# Patient Record
Sex: Female | Born: 1935 | Race: White | Hispanic: No | Marital: Married | Smoking: Never smoker
Health system: Southern US, Community
[De-identification: ages and names within clinical notes are randomized; demographics above are authoritative.]

## PROBLEM LIST (undated history)

## (undated) DIAGNOSIS — E119 Type 2 diabetes mellitus without complications: Secondary | ICD-10-CM

## (undated) DIAGNOSIS — I639 Cerebral infarction, unspecified: Secondary | ICD-10-CM

## (undated) DIAGNOSIS — G4730 Sleep apnea, unspecified: Secondary | ICD-10-CM

## (undated) DIAGNOSIS — I739 Peripheral vascular disease, unspecified: Secondary | ICD-10-CM

## (undated) DIAGNOSIS — K219 Gastro-esophageal reflux disease without esophagitis: Secondary | ICD-10-CM

## (undated) DIAGNOSIS — M1990 Unspecified osteoarthritis, unspecified site: Secondary | ICD-10-CM

## (undated) DIAGNOSIS — F419 Anxiety disorder, unspecified: Secondary | ICD-10-CM

## (undated) DIAGNOSIS — M5412 Radiculopathy, cervical region: Secondary | ICD-10-CM

## (undated) DIAGNOSIS — R519 Headache, unspecified: Secondary | ICD-10-CM

## (undated) DIAGNOSIS — G4733 Obstructive sleep apnea (adult) (pediatric): Secondary | ICD-10-CM

## (undated) DIAGNOSIS — H269 Unspecified cataract: Secondary | ICD-10-CM

## (undated) DIAGNOSIS — J45909 Unspecified asthma, uncomplicated: Secondary | ICD-10-CM

## (undated) DIAGNOSIS — E785 Hyperlipidemia, unspecified: Secondary | ICD-10-CM

## (undated) DIAGNOSIS — A159 Respiratory tuberculosis unspecified: Secondary | ICD-10-CM

## (undated) DIAGNOSIS — T7840XA Allergy, unspecified, initial encounter: Secondary | ICD-10-CM

## (undated) DIAGNOSIS — R7611 Nonspecific reaction to tuberculin skin test without active tuberculosis: Secondary | ICD-10-CM

## (undated) DIAGNOSIS — E079 Disorder of thyroid, unspecified: Secondary | ICD-10-CM

## (undated) DIAGNOSIS — G4700 Insomnia, unspecified: Secondary | ICD-10-CM

## (undated) DIAGNOSIS — R51 Headache: Secondary | ICD-10-CM

## (undated) DIAGNOSIS — Z8673 Personal history of transient ischemic attack (TIA), and cerebral infarction without residual deficits: Secondary | ICD-10-CM

## (undated) DIAGNOSIS — N189 Chronic kidney disease, unspecified: Secondary | ICD-10-CM

## (undated) DIAGNOSIS — G8929 Other chronic pain: Secondary | ICD-10-CM

## (undated) DIAGNOSIS — I10 Essential (primary) hypertension: Secondary | ICD-10-CM

## (undated) DIAGNOSIS — M797 Fibromyalgia: Secondary | ICD-10-CM

## (undated) DIAGNOSIS — K589 Irritable bowel syndrome without diarrhea: Secondary | ICD-10-CM

## (undated) HISTORY — DX: Fibromyalgia: M79.7

## (undated) HISTORY — PX: TOTAL ABDOMINAL HYSTERECTOMY: SHX209

## (undated) HISTORY — DX: Unspecified osteoarthritis, unspecified site: M19.90

## (undated) HISTORY — DX: Radiculopathy, cervical region: M54.12

## (undated) HISTORY — DX: Respiratory tuberculosis unspecified: A15.9

## (undated) HISTORY — PX: OTHER SURGICAL HISTORY: SHX169

## (undated) HISTORY — DX: Peripheral vascular disease, unspecified: I73.9

## (undated) HISTORY — DX: Disorder of thyroid, unspecified: E07.9

## (undated) HISTORY — DX: Cerebral infarction, unspecified: I63.9

## (undated) HISTORY — PX: OVARIAN CYST REMOVAL: SHX89

## (undated) HISTORY — DX: Irritable bowel syndrome, unspecified: K58.9

## (undated) HISTORY — DX: Insomnia, unspecified: G47.00

## (undated) HISTORY — DX: Headache, unspecified: R51.9

## (undated) HISTORY — DX: Essential (primary) hypertension: I10

## (undated) HISTORY — DX: Personal history of transient ischemic attack (TIA), and cerebral infarction without residual deficits: Z86.73

## (undated) HISTORY — DX: Anxiety disorder, unspecified: F41.9

## (undated) HISTORY — DX: Unspecified cataract: H26.9

## (undated) HISTORY — DX: Chronic kidney disease, unspecified: N18.9

## (undated) HISTORY — DX: Hyperlipidemia, unspecified: E78.5

## (undated) HISTORY — DX: Unspecified asthma, uncomplicated: J45.909

## (undated) HISTORY — DX: Type 2 diabetes mellitus without complications: E11.9

## (undated) HISTORY — DX: Headache: R51

## (undated) HISTORY — DX: Allergy, unspecified, initial encounter: T78.40XA

## (undated) HISTORY — DX: Gastro-esophageal reflux disease without esophagitis: K21.9

## (undated) HISTORY — DX: Other chronic pain: G89.29

## (undated) HISTORY — DX: Sleep apnea, unspecified: G47.30

## (undated) HISTORY — DX: Obstructive sleep apnea (adult) (pediatric): G47.33

## (undated) HISTORY — DX: Nonspecific reaction to tuberculin skin test without active tuberculosis: R76.11

---

## 2007-09-26 HISTORY — PX: LUMBAR LAMINECTOMY: SHX95

## 2007-09-26 HISTORY — PX: HIP SURGERY: SHX245

## 2010-07-26 HISTORY — PX: HYDROCELE EXCISION / REPAIR: SUR1145

## 2011-03-28 ENCOUNTER — Emergency Department (INDEPENDENT_AMBULATORY_CARE_PROVIDER_SITE_OTHER): Payer: Medicare Other

## 2011-03-28 ENCOUNTER — Emergency Department (HOSPITAL_BASED_OUTPATIENT_CLINIC_OR_DEPARTMENT_OTHER)
Admission: EM | Admit: 2011-03-28 | Discharge: 2011-03-28 | Disposition: A | Discharge status: Home / Self Care | Payer: Medicare Other | Attending: Emergency Medicine | Admitting: Emergency Medicine

## 2011-03-28 DIAGNOSIS — N390 Urinary tract infection, site not specified: Secondary | ICD-10-CM | POA: Insufficient documentation

## 2011-03-28 DIAGNOSIS — R51 Headache: Secondary | ICD-10-CM

## 2011-03-28 DIAGNOSIS — I10 Essential (primary) hypertension: Secondary | ICD-10-CM | POA: Insufficient documentation

## 2011-03-28 DIAGNOSIS — I679 Cerebrovascular disease, unspecified: Secondary | ICD-10-CM

## 2011-03-28 DIAGNOSIS — M542 Cervicalgia: Secondary | ICD-10-CM | POA: Insufficient documentation

## 2011-03-28 DIAGNOSIS — K5900 Constipation, unspecified: Secondary | ICD-10-CM | POA: Insufficient documentation

## 2011-03-28 DIAGNOSIS — E7800 Pure hypercholesterolemia, unspecified: Secondary | ICD-10-CM | POA: Insufficient documentation

## 2011-03-28 DIAGNOSIS — R109 Unspecified abdominal pain: Secondary | ICD-10-CM

## 2011-03-28 DIAGNOSIS — Z8739 Personal history of other diseases of the musculoskeletal system and connective tissue: Secondary | ICD-10-CM | POA: Insufficient documentation

## 2011-03-28 DIAGNOSIS — R42 Dizziness and giddiness: Secondary | ICD-10-CM

## 2011-03-28 DIAGNOSIS — E119 Type 2 diabetes mellitus without complications: Secondary | ICD-10-CM | POA: Insufficient documentation

## 2011-03-28 DIAGNOSIS — G319 Degenerative disease of nervous system, unspecified: Secondary | ICD-10-CM

## 2011-03-28 LAB — URINALYSIS, ROUTINE W REFLEX MICROSCOPIC
Bilirubin Urine: NEGATIVE
Ketones, ur: NEGATIVE mg/dL
Specific Gravity, Urine: 1.021 (ref 1.005–1.030)
pH: 6 (ref 5.0–8.0)

## 2011-03-28 LAB — COMPREHENSIVE METABOLIC PANEL
ALT: 25 U/L (ref 0–35)
Alkaline Phosphatase: 72 U/L (ref 39–117)
CO2: 26 mEq/L (ref 19–32)
Chloride: 104 mEq/L (ref 96–112)
GFR calc Af Amer: >60 mL/min (ref >60)
Glucose, Bld: 157 mg/dL — ABNORMAL HIGH (ref 70–99)
Potassium: 3.8 mEq/L (ref 3.5–5.1)
Sodium: 140 mEq/L (ref 135–145)
Total Protein: 7.2 g/dL (ref 6.0–8.3)

## 2011-03-28 LAB — URINE MICROSCOPIC-ADD ON

## 2011-03-28 LAB — CBC
Hemoglobin: 12.9 g/dL (ref 12.0–15.0)
MCHC: 33.5 g/dL (ref 30.0–36.0)
RBC: 4.44 MIL/uL (ref 3.87–5.11)
WBC: 6.3 10*3/uL (ref 4.0–10.5)

## 2011-03-28 LAB — DIFFERENTIAL
Basophils Absolute: 0 10*3/uL (ref 0.0–0.1)
Basophils Relative: 1 % (ref 0–1)
Neutro Abs: 3.6 10*3/uL (ref 1.7–7.7)
Neutrophils Relative %: 57 % (ref 43–77)

## 2012-09-25 HISTORY — PX: CATARACT EXTRACTION, BILATERAL: SHX1313

## 2014-08-18 DIAGNOSIS — Z982 Presence of cerebrospinal fluid drainage device: Secondary | ICD-10-CM

## 2014-08-18 DIAGNOSIS — G910 Communicating hydrocephalus: Secondary | ICD-10-CM | POA: Diagnosis present

## 2015-08-18 DIAGNOSIS — G4733 Obstructive sleep apnea (adult) (pediatric): Secondary | ICD-10-CM | POA: Diagnosis present

## 2015-08-18 DIAGNOSIS — I10 Essential (primary) hypertension: Secondary | ICD-10-CM | POA: Insufficient documentation

## 2015-08-18 DIAGNOSIS — M797 Fibromyalgia: Secondary | ICD-10-CM | POA: Insufficient documentation

## 2015-12-08 DIAGNOSIS — E1169 Type 2 diabetes mellitus with other specified complication: Secondary | ICD-10-CM | POA: Diagnosis present

## 2015-12-08 DIAGNOSIS — E669 Obesity, unspecified: Secondary | ICD-10-CM

## 2015-12-08 DIAGNOSIS — K219 Gastro-esophageal reflux disease without esophagitis: Secondary | ICD-10-CM | POA: Insufficient documentation

## 2015-12-08 DIAGNOSIS — K581 Irritable bowel syndrome with constipation: Secondary | ICD-10-CM | POA: Insufficient documentation

## 2016-02-14 DIAGNOSIS — Z8711 Personal history of peptic ulcer disease: Secondary | ICD-10-CM | POA: Insufficient documentation

## 2016-02-21 DIAGNOSIS — I5189 Other ill-defined heart diseases: Secondary | ICD-10-CM | POA: Insufficient documentation

## 2016-02-29 DIAGNOSIS — M5416 Radiculopathy, lumbar region: Secondary | ICD-10-CM | POA: Insufficient documentation

## 2016-04-21 DIAGNOSIS — R413 Other amnesia: Secondary | ICD-10-CM | POA: Insufficient documentation

## 2016-05-26 ENCOUNTER — Ambulatory Visit: Payer: Medicare Other | Admitting: Internal Medicine

## 2016-07-17 ENCOUNTER — Encounter: Payer: Self-pay | Admitting: Internal Medicine

## 2016-07-24 ENCOUNTER — Encounter: Payer: Self-pay | Admitting: Neurology

## 2016-07-24 ENCOUNTER — Ambulatory Visit (INDEPENDENT_AMBULATORY_CARE_PROVIDER_SITE_OTHER): Payer: Medicare (Managed Care) | Admitting: Neurology

## 2016-07-24 VITALS — BP 142/78 | HR 76 | Resp 16 | Ht 62.8 in | Wt 179.0 lb

## 2016-07-24 DIAGNOSIS — R419 Unspecified symptoms and signs involving cognitive functions and awareness: Secondary | ICD-10-CM

## 2016-07-24 DIAGNOSIS — Z982 Presence of cerebrospinal fluid drainage device: Secondary | ICD-10-CM | POA: Diagnosis not present

## 2016-07-24 DIAGNOSIS — R2689 Other abnormalities of gait and mobility: Secondary | ICD-10-CM

## 2016-07-24 DIAGNOSIS — G912 (Idiopathic) normal pressure hydrocephalus: Secondary | ICD-10-CM | POA: Diagnosis not present

## 2016-07-24 NOTE — Patient Instructions (Addendum)
I believe you have a gait disorder, which likely is due to a combination of things: normal aging, degenerative arthritis of your back, possible atherosclerosis of the blood vessels in your brain.   Remember to drink plenty of fluid, eat healthy meals and do not skip any meals. Try to eat protein with a every meal and eat a healthy snack such as fruit or nuts in between meals. Try to keep a regular sleep-wake schedule and try to exercise daily, particularly in the form of walking, 20-30 minutes a day, if you can. Change positions slowly and you should use your walker at all times.  As far as your medications are concerned, I would like to suggest no new medications.    Please reduce your sweetened tea intake and increase your water intake.   Your memory scores are fairly good. We will monitor your exam. Your neuro exam is non focal. Nevertheless, please see your new neurosurgeon - your primary care may be working on a referral for this.   You may want to see your orthopedic doctor for low back pain. An epidural steroid injection sometimes does help for months at a time.   Please be really proactive with your constipation medication regimen, titrating as needed to where you have a formed stool at least every other day.

## 2016-07-24 NOTE — Progress Notes (Signed)
Subjective:    Patient ID: Brittany Atkins is a 80 y.o. female.  HPI     Star Age, MD, PhD [facility] [address] [place], [place] [address]  Dear Dr. Jimmye Norman,   I saw your patient, Brittany Atkins, upon your kind request in my neurologic clinic today for initial consultation of her gait disorder and memory loss. The patient is accompanied by her daughter today. 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 She was diagnosed with normal pressure hydrocephalus several years prior to her shunt placement, then moved to [place] and moved back to [place] about 3 years ago. [facility], goes twice weekly, Mondays and Wednesdays for about 4 hours each time and has physical therapy and occupational therapy there.  She had a head CT without contrast on 03/28/2011 which I reviewed: IMPRESSION: No acute intracranial abnormality. Atrophy, chronic microvascular disease.  She was diagnosed with  TIA in May 2017. She was at [facility]. Records are not available for my review today. Symptoms  Included bilateral leg weakness and slurring of speech as I understand. She did not have one-sided weakness or numbness or facial droop. She was seen by  Her  Neurosurgeon at the time and shunt was adjusted per daughter. She has not seen her neurosurgeon since then, but there may be an insurance change as well, requiring her to change to another NSG practice.  She has had low back pain. She had seen orthopedics for right hip pain before and received an injection into the right hip. She was told that she may need a referral for low back pain to another specialist and that she may benefit from an ESI.  She has been using a  rolling walker.  Thankfully, she has not fallen  Recently but has come close to several times.   Memory wise, she has trouble focusing and has short-term memory problems as well as trouble with complex tasks such as cooking. She does not always eat well. She is not sure if she drinks enough. She feels like she has something to drink all the time. During her hospitalization in May she was told she had a urinary tract infection and had severe constipation with impaction. She lives with her husband, who has Parkinson's disease, and a single story home. One daughter and one son live in [place], her youngest daughter lives locally in [place]. Of note, she was diagnosed with obstructive sleep apnea and placed on CPAP therapy, which she could not tolerate as the headgear bothered her shunt tubing and caused pain. Per daughter, sleep apnea was mild.  Her Past Medical History Is Significant For: Past Medical History:  Diagnosis Date  . Asthma with allergic rhinitis   . Cervical radiculopathy   . Chronic pain   . Diabetes mellitus without complication ([place])   . Fibromyalgia   . GERD (gastroesophageal reflux disease)   . H/O TIA (transient ischemic attack) and stroke   . Headache   . Hypertension   . OSA (obstructive sleep apnea)   . Osteoarthritis     Her Past Surgical History Is Significant For: Past Surgical History:  Procedure Laterality Date  . BACK SURGERY    . CATARACT EXTRACTION, BILATERAL    . HIP SURGERY    .  HYSTEROTOMY      Her Family History Is Significant For: Family History  Problem Relation Age of Onset  . Hypertension Mother   . Hyperlipidemia Mother   . Heart disease Mother   . Breast cancer Mother   . Diabetes Father   . Hypertension Father   . Cancer Father   . Stroke Father     Her Social History Is Significant For: Social History   Social History  . Marital status: Married    Spouse name: N/A  . Number of children: 3  . Years of education: college    Occupational History  . retired    Social History Main Topics  . Smoking status: Never Smoker  . Smokeless tobacco: Never Used  . Alcohol use No  . Drug use: No  . Sexual activity: Not Asked   Other Topics Concern  . None   Social History Narrative   Drinks 1 cup of coffee a day, also drinks green tea     Her Allergies Are:  Allergies  Allergen Reactions  . Cefdinir   . Clindamycin/Lincomycin   . Codeine Nausea Only  . Sulfamethizole   . Percocet [Oxycodone-Acetaminophen] Rash  :   Her Current Medications Are:  Outpatient Encounter Prescriptions as of 07/24/2016  Medication Sig  . albuterol (PROAIR HFA) 108 (90 Base) MCG/ACT inhaler Inhale into the lungs every 6 (six) hours as needed for wheezing or shortness of breath.  Marland Kitchen aspirin 81 MG tablet Take 81 mg by mouth daily.  Marland Kitchen atorvastatin (LIPITOR) 40 MG tablet Take 40 mg by mouth daily.  Marland Kitchen azelastine (OPTIVAR) 0.05 % ophthalmic solution 1 drop 2 (two) times daily.  Marland Kitchen b complex vitamins tablet Take 1 tablet by mouth daily.  . Coenzyme Q10 100 MG TABS Take by mouth.  . famotidine (PEPCID) 40 MG tablet Take 40 mg by mouth daily.  . fluticasone (FLONASE) 50 MCG/ACT nasal spray Place into both nostrils daily.  Marland Kitchen gabapentin (NEURONTIN) 100 MG capsule Take 100 mg by mouth 2 (two) times daily.  Marland Kitchen gabapentin (NEURONTIN) 400 MG capsule Take 400 mg by mouth 3 (three) times daily.  . Lactobacillus (ACIDOPHILUS) 100 MG CAPS Take by mouth.  . loratadine (CLARITIN) 10 MG tablet Take 10 mg by mouth daily.  Marland Kitchen losartan-hydrochlorothiazide (HYZAAR) 50-12.5 MG tablet Take 1 tablet by mouth daily.  . Melatonin 3 MG TABS Take by mouth.  . metFORMIN (GLUCOPHAGE) 500 MG tablet Take by mouth 2 (two) times daily with a meal.  . milk thistle 175 MG tablet Take 175 mg by mouth daily.  . Multiple Vitamin (MULTIVITAMIN) capsule Take 1 capsule by mouth daily.  . traMADol (ULTRAM) 50 MG tablet Take by mouth every 6 (six) hours as needed.   No  facility-administered encounter medications on file as of 07/24/2016.   :   Review of Systems:  Out of a complete 14 point review of systems, all are reviewed and negative with the exception of these symptoms as listed below:  Review of Systems  Neurological:       Patient has a shunt placed for hydrocephalus back in 2011 (Strata Adjustable Valve). Recently her gait has been off balanced and has increase in migraines. Referring doctor would like the shunt to be checked.     Objective:  Neurologic Exam  Physical Exam Physical Examination:   Vitals:   07/24/16 0959  BP: (!) 142/78  Pulse: 76  Resp: 16   General Examination: The patient is a very pleasant 80  y.o. female in no acute distress. She appears well-developed and well-nourished and well groomed.   HEENT: Normocephalic, atraumatic, pupils are equal, round and reactive to light and accommodation. Funduscopic exam is normal with sharp disc margins noted. Extraocular tracking is good without limitation to gaze excursion or nystagmus noted. Normal smooth pursuit is noted. Hearing is grossly intact. Face is symmetric with normal facial animation and normal facial sensation. Speech is clear with no dysarthria noted. There is no hypophonia. There is no lip, neck/head, jaw or voice tremor. Neck is supple with full range of passive and active motion. There are no carotid bruits on auscultation. Oropharynx exam reveals: mild mouth dryness, adequate dental hygiene and mild airway crowding, due to redundant soft palate. Mallampati is class II. Tongue protrudes centrally and palate elevates symmetrically. Unremarkable tubing from VPS on R.  Chest: Clear to auscultation without wheezing, rhonchi or crackles noted.  Heart: S1+S2+0, regular and normal without murmurs, rubs or gallops noted.   Abdomen: Soft, non-tender and non-distended with normal bowel sounds appreciated on auscultation.  Extremities: There is no pitting edema in the distal  lower extremities bilaterally. Pedal pulses are intact.  Skin: Warm and dry without trophic changes noted. There are no varicose veins.  Musculoskeletal: exam reveals no obvious joint deformities, tenderness or joint swelling or erythema.   Neurologically:  Mental status: The patient is awake, alert and oriented in all 4 spheres. Her immediate and remote memory, attention, language skills and fund of knowledge are fairly appropriate. There is no evidence of aphasia, agnosia, apraxia or anomia. Speech is clear with normal prosody and enunciation. Thought process is linear. Mood is normal and affect is normal.  On 07/24/2016: MMSE: 28/30, CDT: 4/4, AFT: 19/min.  Cranial nerves II - XII are as described above under HEENT exam. In addition: shoulder shrug is normal with equal shoulder height noted. Motor exam: Normal bulk, strength and tone is noted. There is no drift, tremor or rebound. Romberg is negative. Reflexes are 2+ throughout. Fine motor skills and coordination: intact with normal finger taps, normal hand movements, normal rapid alternating patting, normal foot taps and normal foot agility.  Cerebellar testing: No dysmetria or intention tremor on finger to nose testing. Heel to shin is unremarkable bilaterally. There is no truncal or gait ataxia.  Sensory exam: intact to light touch, pinprick, vibration, temperature sense in the upper and lower extremities.  Gait, station and balance: She stands with difficulty. No veering to one side is noted. No leaning to one side is noted. Posture is age-appropriate and stance is narrow based. Gait shows cautious and slow gait, she maneuvers her rolling walker well. No shuffling. Tandem walk is not possible for her.                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 On examination, she has a nonspecific gait disorder, most likely secondary to a combination of degenerative arthritis of the lower back, prior diagnosis of NPH, white matter atherosclerotic changes of the brain, normal aging, not always hydrating well enough. She does not have a shuffling gait or sensory ataxia, otherwise neurological exam is nonfocal, memory scores are fairly good and we will monitor. I did not suggest that we start her on  any new medication today. She will benefit from shunt reevaluation with her neurosurgeon or a referral to a different neurosurgical practice, this may be in the works from your office as she indicated that this may be in process through your office. In addition, she may benefit from seeing an orthopedic specialist for her low back pain as she has previously seen one in [facility] and may be able to go back to her orthopedic specialist. She is encouraged to do so. She is advised to drink plenty of water and be proactive with her constipation, use her walker at all times, we will continue to monitor her memory scores and gait.  I had a long chat with the patient and her daughter about my findings and her symptoms. We talked about maintaining a healthy lifestyle in general. I encouraged the patient to eat healthy, exercise daily and keep well hydrated, to keep a scheduled bedtime and wake time routine, to not skip any meals and eat healthy snacks in between meals and to have protein with every meal.  I would like to see her back in about 6 months, sooner as needed.  I answered all their questions today and the patient and her daughter were in agreement.  Thank you very much for allowing me to participate in the care of this nice patient. If I can be of any further assistance to you please do not hesitate to call me at [phone].  Sincerely,   Star Age, MD, PhD

## 2016-07-26 ENCOUNTER — Ambulatory Visit: Payer: Medicare Other | Admitting: Internal Medicine

## 2016-07-26 ENCOUNTER — Telehealth: Payer: Self-pay | Admitting: Gastroenterology

## 2016-09-07 NOTE — Telephone Encounter (Signed)
Pt was seen 2017 at [facility] and appt was canceled until further records recieved

## 2016-09-14 NOTE — Telephone Encounter (Signed)
Dr. Havery Moros reviewed records and has accepted patient. Ok to schedule OV. Left message for patient to return my call.

## 2016-09-15 ENCOUNTER — Encounter: Payer: Self-pay | Admitting: Gastroenterology

## 2016-11-07 ENCOUNTER — Ambulatory Visit: Payer: Medicare Other | Admitting: Gastroenterology

## 2016-11-09 ENCOUNTER — Encounter (INDEPENDENT_AMBULATORY_CARE_PROVIDER_SITE_OTHER): Payer: Self-pay

## 2016-11-09 ENCOUNTER — Ambulatory Visit (INDEPENDENT_AMBULATORY_CARE_PROVIDER_SITE_OTHER): Payer: Medicare (Managed Care) | Admitting: Gastroenterology

## 2016-11-09 ENCOUNTER — Other Ambulatory Visit: Payer: Medicare (Managed Care)

## 2016-11-09 ENCOUNTER — Encounter: Payer: Self-pay | Admitting: Gastroenterology

## 2016-11-09 VITALS — BP 104/56 | HR 76 | Ht 62.8 in | Wt 177.0 lb

## 2016-11-09 DIAGNOSIS — K582 Mixed irritable bowel syndrome: Secondary | ICD-10-CM | POA: Diagnosis not present

## 2016-11-09 DIAGNOSIS — K589 Irritable bowel syndrome without diarrhea: Secondary | ICD-10-CM

## 2016-11-09 DIAGNOSIS — R110 Nausea: Secondary | ICD-10-CM

## 2016-11-09 DIAGNOSIS — R1013 Epigastric pain: Secondary | ICD-10-CM

## 2016-11-09 DIAGNOSIS — R932 Abnormal findings on diagnostic imaging of liver and biliary tract: Secondary | ICD-10-CM

## 2016-11-09 DIAGNOSIS — Z86010 Personal history of colonic polyps: Secondary | ICD-10-CM

## 2016-11-09 MED ORDER — ONDANSETRON 4 MG PO TBDP: 4.0000 mg | ORAL_TABLET | Freq: Four times a day (QID) | ORAL | 3 refills | Status: DC | PRN

## 2016-11-09 MED ORDER — NA SULFATE-K SULFATE-MG SULF 17.5-3.13-1.6 GM/177ML PO SOLN: 1.0000 | Freq: Once | ORAL | 0 refills | Status: AC

## 2016-11-09 MED ORDER — DICYCLOMINE HCL 10 MG PO CAPS: 10.0000 mg | ORAL_CAPSULE | Freq: Three times a day (TID) | ORAL | 3 refills | Status: DC | PRN

## 2016-11-09 NOTE — Patient Instructions (Addendum)
If you are age 79 or older, your body mass index should be between 23-30. Your Body mass index is 31.55 kg/m. If this is out of the aforementioned range listed, please consider follow up with your Primary Care Provider.  If you are age 15 or younger, your body mass index should be between 19-25. Your Body mass index is 31.55 kg/m. If this is out of the aformentioned range listed, please consider follow up with your Primary Care Provider.   Dr Havery Moros has prescribed the following medications for you:  Bentyl  Zofran  You have been given samples of [place].  You have been given a Low FodMap diet to follow.  You have been scheduled for a colonoscopy. Please follow written instructions given to you at your visit today.  Please pick up your prep supplies at the pharmacy within the next 1-3 days. If you use inhalers (even only as needed), please bring them with you on the day of your procedure. Your physician has requested that you go to www.startemmi.com and enter the access code given to you at your visit today. This web site gives a general overview about your procedure. However, you should still follow specific instructions given to you by our office regarding your preparation for the procedure.  You have been scheduled for an abdominal ultrasound at [facility] (1st floor of hospital) on Wednesday February 21st at 10:00am. Please arrive 15 minutes prior to your appointment for registration. Make certain not to have anything to eat or drink 8 hours prior to your appointment. Should you need to reschedule your appointment, please contact radiology at [phone]. This test typically takes about 30 minutes to perform.  Your physician has requested that you go to the basement for the following lab work before leaving today:  IGA, TTG  Thank you.

## 2016-11-09 NOTE — Progress Notes (Signed)
HPI :  81 y/o female with a PMH of hepatic steatosis, GERD, fibromyalgia, IBS, NPH with shunt, who is new to our practice. She has been seen by Dr. Dorrene German at [place] in the past year and is requesting to establish with [place]. She reportedly has a history of IBS, treated with immodium, IB gard, and peppermint oil in the past which did not help too much. She has had negative c diff and GI pathogen panel testing. She's previously had a history of fecal impaction when hospitalized for TIA. Given miralax at that time and then has had persistent diarrhea, which was in 2015. Not used immodium due to fear of recurrent fecal impaction.   She reported problems with loose stools from May 2017 through September 2017, but now she has alternating constipation and diarrhea. She reports 50% diarrhea, 50% constipated stools. She has had some scant rectal bleeding she attributes to hemorrhoids. She has chronic pains in her abdomen, in the RLQ mostly. Pain is associated with a bowel movement. After a bowel movement her pain is reliably relieved. She is taking Senna currently. She is not taking any routine fiber supplements. She endorses gas and bloating which bothers her. She denies any prior testing for celiac disease.   She thinks her last colonoscopy was in 2013 or so, she reports having polyps removed and that she needed another colonoscopy in 5 years. Records not available.   She reports having a workup for possible cirrhosis. Imaging as below. She denies FH of liver disease. She states she had jaundice as a child but not as an adult. She reportedly has had negative labs for liver diseases in [place] (records not available) and then declined a liver biopsy. She denies alcohol use.   She reports she has difficulty with swallowing, feels food get lodged in her lower chest that bothers her, as well as pills. Bothers her most weeks. No liquid dysphagia, but mostly with pills. She is not sure if the dilation in the  past helped her. She has chronic nausea but doesn't vomit. She doesn't take anything for nausea. She endorses early satiety. She denies ever having GES or use of Reglan.    Prior workup on charts reviewed today: [place] on 05/04/15 - steatosis and  changes concerning for cirrhosis. She had a negative serologic workup for this and declined a liver biopsy per chart.   EGD 06/16/15 - lower esophageal stricture dilated to 39 Fr, biopsies taken to rule out EoE CT scan abdomen 07/07/15 - small HH, diverticulosis, mild hepatic steatosis Last Echo on 01/2016 showed EF of 60-65%  Labs on 06/2016: Hgb 12.9, WBC 7.0, platelet 195 ALT 29, AST 21, T bil 0.2, AP 81   Past Medical History:  Diagnosis Date  . Asthma with allergic rhinitis   . Cervical radiculopathy   . Chronic pain   . Diabetes mellitus without complication ([place])   . Fibromyalgia   . GERD (gastroesophageal reflux disease)   . H/O TIA (transient ischemic attack) and stroke   . Headache   . HLD (hyperlipidemia)   . Hypertension   . IBS (irritable bowel syndrome)   . Insomnia   . OSA (obstructive sleep apnea)   . Osteoarthritis   . PAD (peripheral artery disease) ([place])   . Positive PPD   NPH with shunt   Past Surgical History:  Procedure Laterality Date  . CATARACT EXTRACTION, BILATERAL  2014  . HIP SURGERY  2009   hip muscle  . HYDROCELE  EXCISION / REPAIR  07/2010  . LUMBAR LAMINECTOMY  2009  . OVARIAN CYST REMOVAL     with  fallopian tube  . TOTAL ABDOMINAL HYSTERECTOMY     Family History  Problem Relation Age of Onset  . Hypertension Mother   . Hyperlipidemia Mother   . Heart disease Mother   . Breast cancer Mother   . Diabetes Father   . Hypertension Father   . Stroke Father   . Prostate cancer Father   . Diabetes Sister   . Diabetes Brother     x 2  . Heart attack Maternal Grandmother   . Heart attack Maternal Grandfather   . Thyroid cancer Paternal Grandmother   . Hyperthyroidism Maternal Aunt     x 2    Social History  Substance Use Topics  . Smoking status: Never Smoker  . Smokeless tobacco: Never Used  . Alcohol use No   Current Outpatient Prescriptions  Medication Sig Dispense Refill  . albuterol (PROAIR HFA) 108 (90 Base) MCG/ACT inhaler Inhale into the lungs every 6 (six) hours as needed for wheezing or shortness of breath.    Marland Kitchen aspirin 81 MG tablet Take 81 mg by mouth daily.    Marland Kitchen atorvastatin (LIPITOR) 40 MG tablet Take 40 mg by mouth daily.    Marland Kitchen azelastine (OPTIVAR) 0.05 % ophthalmic solution Place 1 drop into both eyes 2 (two) times daily.     . famotidine (PEPCID) 40 MG tablet Take 40 mg by mouth daily.    . fluticasone (FLONASE) 50 MCG/ACT nasal spray Place into both nostrils daily.    Marland Kitchen gabapentin (NEURONTIN) 100 MG capsule Take 100 mg by mouth 2 (two) times daily.    Marland Kitchen gabapentin (NEURONTIN) 400 MG capsule Take 400 mg by mouth 3 (three) times daily.    . hydrochlorothiazide (HYDRODIURIL) 25 MG tablet Take 25 mg by mouth daily.    . Lactobacillus (ACIDOPHILUS) 100 MG CAPS Take by mouth.    . loratadine (CLARITIN) 10 MG tablet Take 10 mg by mouth daily.    Marland Kitchen losartan (COZAAR) 50 MG tablet Take 75 mg by mouth daily.    . Melatonin 5 MG TABS Take 1 tablet by mouth at bedtime.    . metFORMIN (GLUCOPHAGE) 500 MG tablet Take by mouth 2 (two) times daily with a meal.    . Probiotic Product (RISAQUAD-2 PO) Take 2 capsules by mouth daily.    Orlie Dakin Sodium (SENEXON-S PO) Take 2 tablets by mouth at bedtime.    . traMADol (ULTRAM) 50 MG tablet Take 50 mg by mouth 2 (two) times daily.      No current facility-administered medications for this visit.    Allergies  Allergen Reactions  . Cefdinir   . Clindamycin/Lincomycin   . Codeine Nausea Only  . Cymbalta [Duloxetine Hcl]   . Sulfamethizole   . Tuberculin Tests     Hx of positive PPd  . Percocet [Oxycodone-Acetaminophen] Rash     Review of Systems: All systems reviewed and negative except where noted in  HPI.   Labs as above  Physical Exam: BP (!) 104/56 (BP Location: Left Arm, Patient Position: Sitting, Cuff Size: Normal)   Pulse 76   Ht 5' 2.8" (1.595 m)   Wt 177 lb (80.3 kg)   BMI 31.55 kg/m  Constitutional: Pleasant, female in no acute distress, sitting in wheelchair. HEENT: Normocephalic and atraumatic. Conjunctivae are normal. No scleral icterus. Neck supple.  Cardiovascular: Normal rate, regular rhythm.  Pulmonary/chest: Effort  normal and breath sounds normal. No wheezing, rales or rhonchi. Abdominal: Soft, protuberant, nontender.  There are no masses palpable. No hepatomegaly. Extremities: no edema Lymphadenopathy: No cervical adenopathy noted. Neurological: Alert and oriented to person place and time. Skin: Skin is warm and dry. No rashes noted. Psychiatric: Normal mood and affect. Behavior is normal.   ASSESSMENT AND PLAN: 81 year old female here for assessment of the following issues:  IBS - this appears to be most likely diagnosis for her chronic bowel changes we discussed this at length. Currently mixed type. I discussed dietary options will try her on a low FODMAP diet. I will also offer a trial of Bentyl to use as needed for cramps. She denies history of testing for celiac, will send serologies for this. She can follow-up as needed if symptoms persist or fail to improve.  Chronic nausea / dyspepsia - I suspect she more than likely has dyspepsia, while gastroparesis is possible as well. We'll try her on some Zofran to see if this helps her nausea as well as her IBS, as well as a trial of FD Gard. I discussed potential role of gastric emptying study and trial of Reglan. Following discussion of Reglan she declined this given her other neurologic disorders, and  wishes to hold off on gastric emptying study at this time.   Abnormal liver imaging - prior ultrasound with findings suggestive of cirrhosis, however her liver function testing, platelet count, and spleen appear  normal and argue against cirrhosis. I suspect she may likely just have fatty liver disease. We discussed this at length and she is anxious about the possibility of cirrhosis. I offered her an ultrasound with elastography given its been 18 months since her last exam, ensure no evidence of cirrhosis. She agreed  History of colon polyps - due for surveillance exam at this time although I don't have formal report of her last exam to review today. I discussed risks and benefits of colonoscopy and anesthesia with her and she wished to proceed for surveillance exam.    Wadsworth Cellar, MD [facility] Pager [phone]  CC: Angelica Pou, MD

## 2016-11-13 ENCOUNTER — Telehealth: Payer: Self-pay

## 2016-11-13 LAB — TISSUE TRANSGLUTAMINASE, IGA: Transglutaminase IgA: <2 U/mL (ref 0–3)

## 2016-11-13 LAB — IGA: Immunoglobulin A, (IgA) QN, Serum: 147 mg/dL (ref 64–422)

## 2016-11-13 NOTE — Telephone Encounter (Signed)
-----   Message from Manus Gunning, MD sent at 11/13/2016  7:38 AM EST ----- Caryl Pina can you please let this patient know her celiac labs are NEGATIVE.  We will continue the plan as outlined in the clinic note (low FODMAP diet, trial of bentyl, trial of zofran, trial of FD gard) and await her [place] results. Thanks

## 2016-11-13 NOTE — Telephone Encounter (Signed)
Tried calling pt. No answer and no machine.

## 2016-11-15 ENCOUNTER — Ambulatory Visit (HOSPITAL_COMMUNITY)
Admission: RE | Admit: 2016-11-15 | Discharge: 2016-11-15 | Disposition: A | Discharge status: Home / Self Care | Payer: Medicare (Managed Care) | Source: Ambulatory Visit | Attending: Gastroenterology | Admitting: Gastroenterology

## 2016-11-15 DIAGNOSIS — R932 Abnormal findings on diagnostic imaging of liver and biliary tract: Secondary | ICD-10-CM | POA: Diagnosis not present

## 2016-11-16 ENCOUNTER — Other Ambulatory Visit: Payer: Self-pay

## 2016-11-16 DIAGNOSIS — K760 Fatty (change of) liver, not elsewhere classified: Secondary | ICD-10-CM

## 2016-11-17 NOTE — Telephone Encounter (Signed)
Pts husband informed of results. No concerns at this time.

## 2016-12-03 ENCOUNTER — Telehealth: Payer: Self-pay | Admitting: Gastroenterology

## 2016-12-03 NOTE — Telephone Encounter (Signed)
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 She has low risk factors for hepatitis B but if she wants the vaccine, Caryl Pina can you help coordinate? thanks

## 2016-12-07 NOTE — Telephone Encounter (Signed)
Spoke with pts husband Abbe Amsterdam. He will make pt aware of results and that she will need to have a nurse visit for Hep B vaccine. He will also call back to schedule nurse visit after coordinating with their daughter.  Please schedule nurse visit and their convenience. Thanks!

## 2016-12-15 ENCOUNTER — Telehealth: Payer: Self-pay | Admitting: Gastroenterology

## 2016-12-18 ENCOUNTER — Encounter: Payer: Medicare (Managed Care) | Admitting: Gastroenterology

## 2016-12-18 NOTE — Telephone Encounter (Signed)
No that's okay, thanks for asking.

## 2016-12-27 ENCOUNTER — Encounter: Payer: Medicare (Managed Care) | Admitting: Gastroenterology

## 2017-02-06 ENCOUNTER — Telehealth: Payer: Self-pay | Admitting: Gastroenterology

## 2017-02-06 ENCOUNTER — Other Ambulatory Visit: Payer: Self-pay

## 2017-02-06 MED ORDER — NA SULFATE-K SULFATE-MG SULF 17.5-3.13-1.6 GM/177ML PO SOLN: 1.0000 | Freq: Once | ORAL | 0 refills | Status: AC

## 2017-02-06 NOTE — Telephone Encounter (Signed)
New set of prep instructions and prep Rx faxed to Dr. Jimmye Norman at: [phone].

## 2017-02-06 NOTE — Telephone Encounter (Signed)
Mailed original copy along with prescription to patient's home address.

## 2017-02-22 ENCOUNTER — Ambulatory Visit (AMBULATORY_SURGERY_CENTER): Payer: Medicare (Managed Care) | Admitting: Gastroenterology

## 2017-02-22 ENCOUNTER — Encounter: Payer: Self-pay | Admitting: Gastroenterology

## 2017-02-22 VITALS — BP 195/81 | HR 62 | Temp 97.3°F | Resp 11 | Ht 62.0 in | Wt 177.0 lb

## 2017-02-22 DIAGNOSIS — K635 Polyp of colon: Secondary | ICD-10-CM | POA: Diagnosis not present

## 2017-02-22 DIAGNOSIS — Z86010 Personal history of colonic polyps: Secondary | ICD-10-CM

## 2017-02-22 DIAGNOSIS — D120 Benign neoplasm of cecum: Secondary | ICD-10-CM

## 2017-02-22 DIAGNOSIS — D122 Benign neoplasm of ascending colon: Secondary | ICD-10-CM

## 2017-02-22 DIAGNOSIS — K6389 Other specified diseases of intestine: Secondary | ICD-10-CM | POA: Diagnosis not present

## 2017-02-22 MED ORDER — SODIUM CHLORIDE 0.9 % IV SOLN: 500.0000 mL | INTRAVENOUS | Status: DC

## 2017-02-22 NOTE — Progress Notes (Signed)
All questions asked again.  Daughter tried to answer most of the questions.  Patient really unable to answer questions about when she took her meds last.  Apparently she has someone put her meds out for her.

## 2017-02-22 NOTE — Progress Notes (Signed)
To PACU VSS Report to RN 

## 2017-02-22 NOTE — Progress Notes (Signed)
Called to room to assist during endoscopic procedure.  Patient ID and intended procedure confirmed with present staff. Received instructions for my participation in the procedure from the performing physician.  

## 2017-02-22 NOTE — Op Note (Signed)
[facility] Patient Name: Brittany Atkins Procedure Date: 02/22/2017 10:24 AM MRN: [id number] Endoscopist: Remo Lipps P. Armbruster MD, MD Age: 81 Referring MD:  Date of Birth: 1935-11-21 Gender: Female Account #: [id number] Procedure:                Colonoscopy Indications:              High risk colon cancer surveillance: Personal                            history of colonic polyps (reported adenoma 5 years                            ago) Medicines:                Monitored Anesthesia Care Procedure:                Pre-Anesthesia Assessment:                           - Prior to the procedure, a History and Physical                            was performed, and patient medications and                            allergies were reviewed. The patient's tolerance of                            previous anesthesia was also reviewed. The risks                            and benefits of the procedure and the sedation                            options and risks were discussed with the patient.                            All questions were answered, and informed consent                            was obtained. Prior Anticoagulants: The patient has                            taken aspirin, last dose was 1 day prior to                            procedure. ASA Grade Assessment: II - A patient                            with mild systemic disease. After reviewing the                            risks and benefits, the patient was deemed in  satisfactory condition to undergo the procedure.                           After obtaining informed consent, the colonoscope                            was passed under direct vision. Throughout the                            procedure, the patient's blood pressure, pulse, and                            oxygen saturations were monitored continuously. The                            Model PCF-H190DL [id number]) scope was  introduced                            through the anus and advanced to the the cecum,                            identified by appendiceal orifice and ileocecal                            valve. The colonoscopy was performed without                            difficulty. The patient tolerated the procedure                            well. The quality of the bowel preparation was                            adequate. The ileocecal valve, appendiceal orifice,                            and rectum were photographed. Scope In: 10:29:56 AM Scope Out: 10:53:19 AM Scope Withdrawal Time: 0 hours 16 minutes 26 seconds  Total Procedure Duration: 0 hours 23 minutes 23 seconds  Findings:                 The perianal and digital rectal examinations were                            normal.                           Two sessile polyps were found in the cecum. The                            polyps were diminutive in size. These polyps were                            removed with a cold biopsy forceps. Resection and  retrieval were complete.                           A 5 mm polyp was found in the ascending colon. The                            polyp was sessile. The polyp was removed with a                            cold snare. Resection and retrieval were complete.                           Multiple small and large-mouthed diverticula were                            found in the left colon, mild diverticulosis                            transverse and right colon.                           Internal hemorrhoids were found during retroflexion.                           The colon was extremely tortuous which prolonged                            the procedure.                           The exam was otherwise without abnormality. Complications:            No immediate complications. Estimated blood loss:                            Minimal. Estimated Blood Loss:     Estimated blood  loss was minimal. Impression:               - Two diminutive polyps in the cecum, removed with                            a cold biopsy forceps. Resected and retrieved.                           - One 5 mm polyp in the ascending colon, removed                            with a cold snare. Resected and retrieved.                           - Diverticulosis in the entire examined colon.                           - Internal hemorrhoids.                           -  Tortuous colon.                           - The examination was otherwise normal. Recommendation:           - Patient has a contact number available for                            emergencies. The signs and symptoms of potential                            delayed complications were discussed with the                            patient. Return to normal activities tomorrow.                            Written discharge instructions were provided to the                            patient.                           - Resume previous diet.                           - Continue present medications.                           - Await pathology results.                           - No ibuprofen, naproxen, or other non-steroidal                            anti-inflammatory drugs for 2 weeks after polyp                            removal. Remo Lipps P. Armbruster MD, MD 02/22/2017 10:58:30 AM This report has been signed electronically.

## 2017-02-22 NOTE — Patient Instructions (Signed)
YOU HAD AN ENDOSCOPIC PROCEDURE TODAY AT [facility]:   Refer to the procedure report that was given to you for any specific questions about what was found during the examination.  If the procedure report does not answer your questions, please call your gastroenterologist to clarify.  If you requested that your care partner not be given the details of your procedure findings, then the procedure report has been included in a sealed envelope for you to review at your convenience later.  YOU SHOULD EXPECT: Some feelings of bloating in the abdomen. Passage of more gas than usual.  Walking can help get rid of the air that was put into your GI tract during the procedure and reduce the bloating. If you had a lower endoscopy (such as a colonoscopy or flexible sigmoidoscopy) you may notice spotting of blood in your stool or on the toilet paper. If you underwent a bowel prep for your procedure, you may not have a normal bowel movement for a few days.  Please Note:  You might notice some irritation and congestion in your nose or some drainage.  This is from the oxygen used during your procedure.  There is no need for concern and it should clear up in a day or so.  SYMPTOMS TO REPORT IMMEDIATELY:   Following lower endoscopy (colonoscopy or flexible sigmoidoscopy):  Excessive amounts of blood in the stool  Significant tenderness or worsening of abdominal pains  Swelling of the abdomen that is new, acute  Fever of 100F or higher    For urgent or emergent issues, a gastroenterologist can be reached at any hour by calling [phone].   DIET:  We do recommend a small meal at first, but then you may proceed to your regular diet.  Drink plenty of fluids but you should avoid alcoholic beverages for 24 hours.  ACTIVITY:  You should plan to take it easy for the rest of today and you should NOT DRIVE or use heavy machinery until tomorrow (because of the sedation medicines used during the test).     FOLLOW UP: Our staff will call the number listed on your records the next business day following your procedure to check on you and address any questions or concerns that you may have regarding the information given to you following your procedure. If we do not reach you, we will leave a message.  However, if you are feeling well and you are not experiencing any problems, there is no need to return our call.  We will assume that you have returned to your regular daily activities without incident.  If any biopsies were taken you will be contacted by phone or by letter within the next 1-3 weeks.  Please call us at [phone] if you have not heard about the biopsies in 3 weeks.    SIGNATURES/CONFIDENTIALITY: You and/or your care partner have signed paperwork which will be entered into your electronic medical record.  These signatures attest to the fact that that the information above on your After Visit Summary has been reviewed and is understood.  Full responsibility of the confidentiality of this discharge information lies with you and/or your care-partner.  No ibuprofen,naproxen,or other non-steroidal anti-inflammatory drugs for 2 weeks,resume remainder of medication. Information given on polyps,hemorrhoids and diverticulosis.

## 2017-02-23 ENCOUNTER — Telehealth: Payer: Self-pay | Admitting: *Deleted

## 2017-02-23 ENCOUNTER — Telehealth: Payer: Self-pay

## 2017-02-23 NOTE — Telephone Encounter (Signed)
  Follow up Call-  Call back number 02/22/2017  Post procedure Call Back phone  # [phone]  Permission to leave phone message Yes  Some recent data might be hidden     Patient questions:  Do you have a fever, pain , or abdominal swelling? No. Pain Score  0 *  Have you tolerated food without any problems? Yes.    Have you been able to return to your normal activities? Yes.    Do you have any questions about your discharge instructions: Diet   No. Medications  No. Follow up visit  No.  Do you have questions or concerns about your Care? No.  Actions: * If pain score is 4 or above: No action needed, pain <4.

## 2017-02-23 NOTE — Telephone Encounter (Signed)
  Follow up Call-  Call back number 02/22/2017  Post procedure Call Back phone  # [phone]  Permission to leave phone message Yes  Some recent data might be hidden     Left message

## 2017-03-01 ENCOUNTER — Encounter: Payer: Self-pay | Admitting: Gastroenterology

## 2017-04-03 ENCOUNTER — Other Ambulatory Visit: Payer: Self-pay | Admitting: Internal Medicine

## 2017-04-03 DIAGNOSIS — R519 Headache, unspecified: Secondary | ICD-10-CM

## 2017-04-03 DIAGNOSIS — R51 Headache: Principal | ICD-10-CM

## 2017-04-09 ENCOUNTER — Ambulatory Visit
Admission: RE | Admit: 2017-04-09 | Discharge: 2017-04-09 | Disposition: A | Discharge status: Home / Self Care | Payer: Medicare (Managed Care) | Source: Ambulatory Visit | Attending: Internal Medicine | Admitting: Internal Medicine

## 2017-04-09 DIAGNOSIS — R51 Headache: Principal | ICD-10-CM

## 2017-04-09 DIAGNOSIS — R519 Headache, unspecified: Secondary | ICD-10-CM

## 2017-05-16 ENCOUNTER — Other Ambulatory Visit: Payer: Self-pay

## 2017-05-16 ENCOUNTER — Telehealth: Payer: Self-pay

## 2017-05-16 DIAGNOSIS — K760 Fatty (change of) liver, not elsewhere classified: Secondary | ICD-10-CM

## 2017-05-16 NOTE — Telephone Encounter (Signed)
-----   Message from Doristine Counter, RN sent at 11/16/2016 12:57 PM EST ----- Remind patient it is time for 6 month lab work. Need to place order for: LFT, CBC, INR dxg: hepatic steatosis, Dr. Havery Moros pt

## 2017-05-16 NOTE — Telephone Encounter (Signed)
Patient under care of [facility]. Faxed orders to their office to have patient do her 6 month lab work. Fax: [phone].

## 2017-05-25 ENCOUNTER — Telehealth: Payer: Self-pay | Admitting: Gastroenterology

## 2017-05-25 NOTE — Telephone Encounter (Signed)
Labs returned from outside facility:  Done on 05/24/17:   CBC - WBC 8.8, Hgb 12.9, MCV 86, plt 210  LFTs normal - ALT 22, AST 19  INR 0.9  Jan can you let her know her labs are normal. No concerning findings. I can see her in clinic in 3 months for reassessment. Thanks

## 2017-05-25 NOTE — Telephone Encounter (Signed)
Spoke with patient and informed her of lab results. Also to come back to clinic in 3 months per Dr. Havery Moros. Patient verbalized understanding.

## 2017-06-25 ENCOUNTER — Other Ambulatory Visit: Payer: Self-pay | Admitting: Internal Medicine

## 2017-06-25 DIAGNOSIS — Z1239 Encounter for other screening for malignant neoplasm of breast: Secondary | ICD-10-CM

## 2017-07-04 ENCOUNTER — Ambulatory Visit
Admission: RE | Admit: 2017-07-04 | Discharge: 2017-07-04 | Disposition: A | Discharge status: Home / Self Care | Payer: Medicare (Managed Care) | Source: Ambulatory Visit | Attending: Internal Medicine | Admitting: Internal Medicine

## 2017-07-04 DIAGNOSIS — Z1239 Encounter for other screening for malignant neoplasm of breast: Secondary | ICD-10-CM

## 2018-02-06 ENCOUNTER — Other Ambulatory Visit (HOSPITAL_COMMUNITY): Payer: Self-pay | Admitting: Nurse Practitioner

## 2018-02-06 ENCOUNTER — Encounter (HOSPITAL_COMMUNITY): Payer: Self-pay | Admitting: Radiology

## 2018-02-06 ENCOUNTER — Ambulatory Visit (HOSPITAL_COMMUNITY)
Admission: RE | Admit: 2018-02-06 | Discharge: 2018-02-06 | Disposition: A | Discharge status: Home / Self Care | Payer: Medicare (Managed Care) | Source: Ambulatory Visit | Attending: Nurse Practitioner | Admitting: Nurse Practitioner

## 2018-02-06 ENCOUNTER — Inpatient Hospital Stay (HOSPITAL_COMMUNITY)
Admission: EM | Admit: 2018-02-06 | Discharge: 2018-02-09 | DRG: 342 | Disposition: A | Discharge status: Home / Self Care | Payer: Medicare (Managed Care) | Attending: Surgery | Admitting: Surgery

## 2018-02-06 DIAGNOSIS — E669 Obesity, unspecified: Secondary | ICD-10-CM | POA: Diagnosis present

## 2018-02-06 DIAGNOSIS — E1122 Type 2 diabetes mellitus with diabetic chronic kidney disease: Secondary | ICD-10-CM | POA: Diagnosis present

## 2018-02-06 DIAGNOSIS — Z7982 Long term (current) use of aspirin: Secondary | ICD-10-CM

## 2018-02-06 DIAGNOSIS — E876 Hypokalemia: Secondary | ICD-10-CM | POA: Diagnosis present

## 2018-02-06 DIAGNOSIS — Z8673 Personal history of transient ischemic attack (TIA), and cerebral infarction without residual deficits: Secondary | ICD-10-CM | POA: Diagnosis not present

## 2018-02-06 DIAGNOSIS — Z794 Long term (current) use of insulin: Secondary | ICD-10-CM

## 2018-02-06 DIAGNOSIS — N189 Chronic kidney disease, unspecified: Secondary | ICD-10-CM | POA: Diagnosis present

## 2018-02-06 DIAGNOSIS — E1169 Type 2 diabetes mellitus with other specified complication: Secondary | ICD-10-CM | POA: Diagnosis present

## 2018-02-06 DIAGNOSIS — I129 Hypertensive chronic kidney disease with stage 1 through stage 4 chronic kidney disease, or unspecified chronic kidney disease: Secondary | ICD-10-CM | POA: Diagnosis present

## 2018-02-06 DIAGNOSIS — K219 Gastro-esophageal reflux disease without esophagitis: Secondary | ICD-10-CM | POA: Diagnosis present

## 2018-02-06 DIAGNOSIS — E1151 Type 2 diabetes mellitus with diabetic peripheral angiopathy without gangrene: Secondary | ICD-10-CM | POA: Diagnosis present

## 2018-02-06 DIAGNOSIS — M797 Fibromyalgia: Secondary | ICD-10-CM | POA: Diagnosis present

## 2018-02-06 DIAGNOSIS — R109 Unspecified abdominal pain: Secondary | ICD-10-CM

## 2018-02-06 DIAGNOSIS — Z7951 Long term (current) use of inhaled steroids: Secondary | ICD-10-CM | POA: Diagnosis not present

## 2018-02-06 DIAGNOSIS — K3580 Unspecified acute appendicitis: Principal | ICD-10-CM

## 2018-02-06 DIAGNOSIS — E785 Hyperlipidemia, unspecified: Secondary | ICD-10-CM | POA: Diagnosis present

## 2018-02-06 DIAGNOSIS — Z79899 Other long term (current) drug therapy: Secondary | ICD-10-CM

## 2018-02-06 DIAGNOSIS — G4733 Obstructive sleep apnea (adult) (pediatric): Secondary | ICD-10-CM | POA: Diagnosis present

## 2018-02-06 DIAGNOSIS — G910 Communicating hydrocephalus: Secondary | ICD-10-CM | POA: Diagnosis present

## 2018-02-06 DIAGNOSIS — K589 Irritable bowel syndrome without diarrhea: Secondary | ICD-10-CM | POA: Diagnosis present

## 2018-02-06 DIAGNOSIS — K660 Peritoneal adhesions (postprocedural) (postinfection): Secondary | ICD-10-CM | POA: Diagnosis present

## 2018-02-06 DIAGNOSIS — Z982 Presence of cerebrospinal fluid drainage device: Secondary | ICD-10-CM

## 2018-02-06 DIAGNOSIS — Z888 Allergy status to other drugs, medicaments and biological substances status: Secondary | ICD-10-CM

## 2018-02-06 LAB — COMPREHENSIVE METABOLIC PANEL
ALBUMIN: 3.7 g/dL (ref 3.5–5.0)
ALT: 22 U/L (ref 14–54)
ANION GAP: 13 (ref 5–15)
AST: 21 U/L (ref 15–41)
Alkaline Phosphatase: 55 U/L (ref 38–126)
BILIRUBIN TOTAL: 0.7 mg/dL (ref 0.3–1.2)
BUN: 13 mg/dL (ref 6–20)
CO2: 27 mmol/L (ref 22–32)
Calcium: 8.9 mg/dL (ref 8.9–10.3)
Chloride: 95 mmol/L — ABNORMAL LOW (ref 101–111)
Creatinine, Ser: 0.81 mg/dL (ref 0.44–1.00)
GFR calc Af Amer: >60 mL/min (ref >60)
Glucose, Bld: 115 mg/dL — ABNORMAL HIGH (ref 65–99)
POTASSIUM: 2.7 mmol/L — AB (ref 3.5–5.1)
Sodium: 135 mmol/L (ref 135–145)
TOTAL PROTEIN: 7.5 g/dL (ref 6.5–8.1)

## 2018-02-06 LAB — CBC WITH DIFFERENTIAL/PLATELET
Basophils Absolute: 0 10*3/uL (ref 0.0–0.1)
Basophils Relative: 0 %
Eosinophils Absolute: 0.1 10*3/uL (ref 0.0–0.7)
Eosinophils Relative: 1 %
HEMATOCRIT: 37.7 % (ref 36.0–46.0)
Hemoglobin: 12.9 g/dL (ref 12.0–15.0)
Lymphocytes Relative: 17 %
Lymphs Abs: 1.7 10*3/uL (ref 0.7–4.0)
MCH: 30 pg (ref 26.0–34.0)
MCHC: 34.2 g/dL (ref 30.0–36.0)
MCV: 87.7 fL (ref 78.0–100.0)
MONO ABS: 1 10*3/uL (ref 0.1–1.0)
MONOS PCT: 10 %
Neutro Abs: 7.3 10*3/uL (ref 1.7–7.7)
Neutrophils Relative %: 72 %
Platelets: 190 10*3/uL (ref 150–400)
RBC: 4.3 MIL/uL (ref 3.87–5.11)
RDW: 12.8 % (ref 11.5–15.5)
WBC: 10 10*3/uL (ref 4.0–10.5)

## 2018-02-06 LAB — HEMOGLOBIN A1C
HEMOGLOBIN A1C: 7.9 % — AB (ref 4.8–5.6)
MEAN PLASMA GLUCOSE: 180.03 mg/dL

## 2018-02-06 LAB — LIPASE, BLOOD: LIPASE: 42 U/L (ref 11–51)

## 2018-02-06 LAB — GLUCOSE, CAPILLARY: GLUCOSE-CAPILLARY: 168 mg/dL — AB (ref 65–99)

## 2018-02-06 MED ORDER — ACETAMINOPHEN 650 MG RE SUPP: 650.0000 mg | Freq: Four times a day (QID) | RECTAL | Status: DC | PRN

## 2018-02-06 MED ORDER — GENTAMICIN SULFATE 40 MG/ML IJ SOLN
7.0000 mg/kg | INTRAVENOUS | Status: DC
  Administered 2018-02-07: 440 mg via INTRAVENOUS
  Filled 2018-02-06 (×2): qty 11

## 2018-02-06 MED ORDER — LIP MEDEX EX OINT
1.0000 "application " | TOPICAL_OINTMENT | Freq: Two times a day (BID) | CUTANEOUS | Status: DC
  Administered 2018-02-07 – 2018-02-08 (×2): 1 via TOPICAL
  Filled 2018-02-06: qty 7

## 2018-02-06 MED ORDER — DIPHENHYDRAMINE HCL 12.5 MG/5ML PO ELIX
12.5000 mg | ORAL_SOLUTION | Freq: Four times a day (QID) | ORAL | Status: DC | PRN
  Administered 2018-02-08 – 2018-02-09 (×2): 12.5 mg via ORAL
  Filled 2018-02-06 (×2): qty 5

## 2018-02-06 MED ORDER — HYDROCODONE-ACETAMINOPHEN 5-325 MG PO TABS
1.0000 | ORAL_TABLET | ORAL | Status: DC | PRN
  Administered 2018-02-07 – 2018-02-08 (×2): 2 via ORAL
  Filled 2018-02-06 (×2): qty 2

## 2018-02-06 MED ORDER — ACETAMINOPHEN 325 MG PO TABS
650.0000 mg | ORAL_TABLET | Freq: Four times a day (QID) | ORAL | Status: DC | PRN
  Administered 2018-02-07: 650 mg via ORAL
  Filled 2018-02-06: qty 2

## 2018-02-06 MED ORDER — METRONIDAZOLE IN NACL 5-0.79 MG/ML-% IV SOLN
500.0000 mg | Freq: Three times a day (TID) | INTRAVENOUS | Status: DC
  Administered 2018-02-07 – 2018-02-09 (×8): 500 mg via INTRAVENOUS
  Filled 2018-02-06 (×8): qty 100

## 2018-02-06 MED ORDER — LACTATED RINGERS IV SOLN
INTRAVENOUS | Status: DC
  Administered 2018-02-06: 20:00:00 via INTRAVENOUS

## 2018-02-06 MED ORDER — IOPAMIDOL (ISOVUE-300) INJECTION 61%
100.0000 mL | Freq: Once | INTRAVENOUS | Status: AC | PRN
  Administered 2018-02-06: 100 mL via INTRAVENOUS

## 2018-02-06 MED ORDER — HYDROCORTISONE 1 % EX CREA: 1.0000 "application " | TOPICAL_CREAM | Freq: Three times a day (TID) | CUTANEOUS | Status: DC | PRN

## 2018-02-06 MED ORDER — ONDANSETRON HCL 4 MG/2ML IJ SOLN
4.0000 mg | Freq: Once | INTRAMUSCULAR | Status: AC
  Administered 2018-02-06: 4 mg via INTRAVENOUS
  Filled 2018-02-06: qty 2

## 2018-02-06 MED ORDER — IOPAMIDOL (ISOVUE-300) INJECTION 61%
INTRAVENOUS | Status: AC
  Filled 2018-02-06: qty 100

## 2018-02-06 MED ORDER — CIPROFLOXACIN IN D5W 400 MG/200ML IV SOLN
400.0000 mg | Freq: Two times a day (BID) | INTRAVENOUS | Status: DC
  Administered 2018-02-06 – 2018-02-09 (×6): 400 mg via INTRAVENOUS
  Filled 2018-02-06 (×5): qty 200

## 2018-02-06 MED ORDER — IOHEXOL 300 MG/ML  SOLN
25.0000 mL | Freq: Once | INTRAMUSCULAR | Status: AC | PRN
  Administered 2018-02-06: 30 mL via ORAL

## 2018-02-06 MED ORDER — MENTHOL 3 MG MT LOZG: 1.0000 | LOZENGE | OROMUCOSAL | Status: DC | PRN

## 2018-02-06 MED ORDER — CHLORHEXIDINE GLUCONATE CLOTH 2 % EX PADS
6.0000 | MEDICATED_PAD | Freq: Once | CUTANEOUS | Status: AC
  Administered 2018-02-06: 6 via TOPICAL

## 2018-02-06 MED ORDER — PROCHLORPERAZINE EDISYLATE 10 MG/2ML IJ SOLN
5.0000 mg | INTRAMUSCULAR | Status: DC | PRN
  Administered 2018-02-08: 10 mg via INTRAVENOUS
  Filled 2018-02-06: qty 2

## 2018-02-06 MED ORDER — SIMETHICONE 80 MG PO CHEW: 40.0000 mg | CHEWABLE_TABLET | Freq: Four times a day (QID) | ORAL | Status: DC | PRN

## 2018-02-06 MED ORDER — LACTATED RINGERS IV BOLUS
1000.0000 mL | Freq: Three times a day (TID) | INTRAVENOUS | Status: AC | PRN
  Administered 2018-02-07: 10:00:00 via INTRAVENOUS

## 2018-02-06 MED ORDER — MAGNESIUM SULFATE 2 GM/50ML IV SOLN
2.0000 g | Freq: Once | INTRAVENOUS | Status: AC
  Administered 2018-02-06: 2 g via INTRAVENOUS
  Filled 2018-02-06: qty 50

## 2018-02-06 MED ORDER — SACCHAROMYCES BOULARDII 250 MG PO CAPS
250.0000 mg | ORAL_CAPSULE | Freq: Two times a day (BID) | ORAL | Status: DC
  Administered 2018-02-07 – 2018-02-08 (×3): 250 mg via ORAL
  Filled 2018-02-06 (×3): qty 1

## 2018-02-06 MED ORDER — PSYLLIUM 95 % PO PACK
1.0000 | PACK | Freq: Every day | ORAL | Status: DC
  Administered 2018-02-08: 1 via ORAL
  Filled 2018-02-06 (×2): qty 1

## 2018-02-06 MED ORDER — INSULIN ASPART 100 UNIT/ML ~~LOC~~ SOLN
0.0000 [IU] | SUBCUTANEOUS | Status: DC
  Administered 2018-02-06: 3 [IU] via SUBCUTANEOUS
  Administered 2018-02-07: 2 [IU] via SUBCUTANEOUS
  Administered 2018-02-07 (×2): 3 [IU] via SUBCUTANEOUS
  Administered 2018-02-07: 5 [IU] via SUBCUTANEOUS
  Administered 2018-02-08 (×3): 3 [IU] via SUBCUTANEOUS
  Administered 2018-02-08 (×2): 2 [IU] via SUBCUTANEOUS
  Administered 2018-02-09: 3 [IU] via SUBCUTANEOUS

## 2018-02-06 MED ORDER — ACETAMINOPHEN 500 MG PO TABS
1000.0000 mg | ORAL_TABLET | ORAL | Status: AC
  Administered 2018-02-07: 1000 mg via ORAL
  Filled 2018-02-06: qty 2

## 2018-02-06 MED ORDER — CIPROFLOXACIN IN D5W 400 MG/200ML IV SOLN
400.0000 mg | INTRAVENOUS | Status: DC
  Filled 2018-02-06: qty 200

## 2018-02-06 MED ORDER — GUAIFENESIN-DM 100-10 MG/5ML PO SYRP: 10.0000 mL | ORAL_SOLUTION | ORAL | Status: DC | PRN

## 2018-02-06 MED ORDER — POTASSIUM CHLORIDE 10 MEQ/100ML IV SOLN
10.0000 meq | Freq: Once | INTRAVENOUS | Status: AC
  Administered 2018-02-06: 10 meq via INTRAVENOUS
  Filled 2018-02-06: qty 100

## 2018-02-06 MED ORDER — ALUM & MAG HYDROXIDE-SIMETH 200-200-20 MG/5ML PO SUSP: 30.0000 mL | Freq: Four times a day (QID) | ORAL | Status: DC | PRN

## 2018-02-06 MED ORDER — SODIUM CHLORIDE 0.9 % IV BOLUS
1000.0000 mL | Freq: Once | INTRAVENOUS | Status: AC
  Administered 2018-02-06: 1000 mL via INTRAVENOUS

## 2018-02-06 MED ORDER — CHLORHEXIDINE GLUCONATE CLOTH 2 % EX PADS
6.0000 | MEDICATED_PAD | Freq: Once | CUTANEOUS | Status: AC
  Administered 2018-02-07: 6 via TOPICAL

## 2018-02-06 MED ORDER — GLUCERNA SHAKE PO LIQD
237.0000 mL | Freq: Two times a day (BID) | ORAL | Status: DC
  Filled 2018-02-06 (×6): qty 237

## 2018-02-06 MED ORDER — MAGNESIUM SULFATE 50 % IJ SOLN: 2.0000 g | Freq: Once | INTRAMUSCULAR | Status: DC

## 2018-02-06 MED ORDER — BISMUTH SUBSALICYLATE 262 MG/15ML PO SUSP: 30.0000 mL | Freq: Three times a day (TID) | ORAL | Status: DC | PRN

## 2018-02-06 MED ORDER — DEXTROSE 5 % IV SOLN
1000.0000 mg | Freq: Four times a day (QID) | INTRAVENOUS | Status: DC | PRN
  Filled 2018-02-06: qty 10

## 2018-02-06 MED ORDER — METRONIDAZOLE IN NACL 5-0.79 MG/ML-% IV SOLN: 500.0000 mg | INTRAVENOUS | Status: AC

## 2018-02-06 MED ORDER — GABAPENTIN 300 MG PO CAPS
300.0000 mg | ORAL_CAPSULE | ORAL | Status: AC
  Administered 2018-02-07: 300 mg via ORAL
  Filled 2018-02-06: qty 1

## 2018-02-06 MED ORDER — MAGIC MOUTHWASH
15.0000 mL | Freq: Four times a day (QID) | ORAL | Status: DC | PRN
  Filled 2018-02-06: qty 15

## 2018-02-06 MED ORDER — DIPHENHYDRAMINE HCL 50 MG/ML IJ SOLN: 12.5000 mg | Freq: Four times a day (QID) | INTRAMUSCULAR | Status: DC | PRN

## 2018-02-06 MED ORDER — MORPHINE SULFATE (PF) 4 MG/ML IV SOLN
4.0000 mg | Freq: Once | INTRAVENOUS | Status: AC
  Administered 2018-02-06: 4 mg via INTRAVENOUS
  Filled 2018-02-06: qty 1

## 2018-02-06 MED ORDER — HYDROCORTISONE 2.5 % RE CREA: 1.0000 "application " | TOPICAL_CREAM | Freq: Four times a day (QID) | RECTAL | Status: DC | PRN

## 2018-02-06 MED ORDER — PHENOL 1.4 % MT LIQD: 1.0000 | OROMUCOSAL | Status: DC | PRN

## 2018-02-06 MED ORDER — ENOXAPARIN SODIUM 40 MG/0.4ML ~~LOC~~ SOLN
40.0000 mg | SUBCUTANEOUS | Status: DC
  Administered 2018-02-06 – 2018-02-08 (×3): 40 mg via SUBCUTANEOUS
  Filled 2018-02-06 (×3): qty 0.4

## 2018-02-06 MED ORDER — ONDANSETRON HCL 4 MG/2ML IJ SOLN
4.0000 mg | Freq: Four times a day (QID) | INTRAMUSCULAR | Status: DC | PRN
  Administered 2018-02-07 – 2018-02-08 (×2): 4 mg via INTRAVENOUS
  Filled 2018-02-06 (×2): qty 2

## 2018-02-06 MED ORDER — ONDANSETRON 4 MG PO TBDP
4.0000 mg | ORAL_TABLET | Freq: Four times a day (QID) | ORAL | Status: DC | PRN
  Administered 2018-02-07 – 2018-02-08 (×2): 4 mg via ORAL
  Filled 2018-02-06 (×2): qty 1

## 2018-02-06 NOTE — ED Notes (Signed)
Nurse asking for 10 more minutes before giving report

## 2018-02-06 NOTE — ED Triage Notes (Signed)
Pt PCP called stating that pt had appendicitis that showed up today on CT scan, also had elevated WBC.  PCP, Dorian Pod, can be reached at on call number [phone] if needed, due to "pt complex  medical hx".

## 2018-02-06 NOTE — Progress Notes (Signed)
Pharmacy Antibiotic Note  Brittany Atkins is a 82 y.o. female admitted on 02/06/2018 with right lower side pain.  CT scan concerning for appendicitis.  Pharmacy has been consulted for gentamicin dosing for intra-abdominal infection.  Scr 0.81, (weight from 10/18 of 80.5kg)  Plan: Gentamicin(  7mg /kg AdjBW ) 440mg  IV q24h Obtain gent level in 10 hours and adjust accordingly  Height: 5\' 2"  (157.5 cm) IBW/kg (Calculated) : 50.1  Temp (24hrs), Avg:98.5 F (36.9 C), Min:98.5 F (36.9 C), Max:98.5 F (36.9 C)  Recent Labs  Lab 02/06/18 1859  WBC 10.0  CREATININE 0.81    CrCl cannot be calculated (Unknown ideal weight.).    Allergies  Allergen Reactions  . Cefdinir   . Clindamycin/Lincomycin   . Codeine Nausea Only  . Cymbalta [Duloxetine Hcl]   . Sulfamethizole   . Tuberculin Tests     Hx of positive PPd  . Oxycodone Rash    Antimicrobials this admission: 5/15 cipro >> 5/15 flagyl >> 5/15 gentamicin>>  Dose adjustments this admission:   Microbiology results:  Thank you for allowing pharmacy to be a part of this patient's care.  Dolly Rias RPh 02/06/2018, 9:31 PM Pager [phone]

## 2018-02-06 NOTE — ED Provider Notes (Signed)
[facility] DEPT Provider Note   CSN: [id number] Arrival date & time: 02/06/18  1749     History   Chief Complaint Chief Complaint  Patient presents with  . Sent by PCP for appendicitis on CT    HPI Brittany Atkins is a 82 y.o. female w/ h/o DM on metformin, TIA, HTN, VP shunt for NPH, chronic headaches, IBS, here from PCP office for abdominal pain. Onset this morning. Pain localized to RLQ, moderate.  Aggravated by palpation.  No alleviating factors.  Saw PCP for this who obtained CT scan remarkable for appendicitis today. Associated symptoms include with nausea and nbnb emesis x 1.  Has had diarrhea x 1 month that she attributes to IBS (50% diarrhea, 50% constipation). Feels like the abdominal pain has improved slightly, nausea improved. Reports subjective fevers x 2 days prior to onset of abdominal pain.  Last PO intake this morning > 8 hours ago.   HPI  Past Medical History:  Diagnosis Date  . Allergy    seasonal  . Anxiety   . Asthma with allergic rhinitis   . Cataract    surgery  . Cervical radiculopathy   . Chronic kidney disease    ladder is not filling up/ usually incontinent  . Chronic pain   . Diabetes mellitus without complication ([place])   . Fibromyalgia   . GERD (gastroesophageal reflux disease)   . H/O TIA (transient ischemic attack) and stroke   . Headache   . HLD (hyperlipidemia)   . Hypertension   . IBS (irritable bowel syndrome)   . Insomnia   . OSA (obstructive sleep apnea)   . Osteoarthritis   . PAD (peripheral artery disease) ([place])   . Positive PPD   . Sleep apnea    doesn't use her CPAP  . Stroke [facility])    TIA's; MAy 2017  . Thyroid disease   . 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 Acute appendicitis 02/06/2018  . Memory loss 04/21/2016  . Lumbar radiculopathy 02/29/2016  . Diastolic dysfunction 71/69/6789  . History of  peptic ulcer disease 02/14/2016  . Chronic GERD 12/08/2015  . Irritable bowel syndrome with constipation 12/08/2015  . Diabetes mellitus type 2 in obese ([place]) 12/08/2015  . Benign essential HTN 08/18/2015  . Fibromyalgia 08/18/2015  . Obstructive sleep apnea 08/18/2015  . 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 08/18/2014    Past Surgical History:  Procedure Laterality Date  . brain shunt     hydrocephalis  . CATARACT EXTRACTION, BILATERAL  2014  . HIP SURGERY  2009   hip muscle  . HYDROCELE EXCISION / REPAIR  07/2010  . LUMBAR LAMINECTOMY  2009  . OVARIAN CYST REMOVAL     with  fallopian tube  . TOTAL ABDOMINAL HYSTERECTOMY       OB History   None      Home Medications    Prior to Admission medications   Medication Sig Start Date End Date Taking? Authorizing Provider  ACYCLOVIR PO Take 1 tablet by mouth daily as needed (fever blisters).   Yes [provider]  albuterol (PROAIR HFA) 108 (90 Base) MCG/ACT inhaler Inhale 2 puffs into the lungs every 6 (six) hours as needed for wheezing or shortness of breath.    Yes [provider]  aspirin 81 MG tablet Take 81  mg by mouth daily.   Yes [provider]  azelastine (OPTIVAR) 0.05 % ophthalmic solution Place 1 drop into both eyes 2 (two) times daily.    Yes [provider]  famotidine (PEPCID) 40 MG tablet Take 40 mg by mouth daily.   Yes [provider]  gabapentin (NEURONTIN) 600 MG tablet Take 600 mg by mouth 3 (three) times daily.    Yes [provider]  hydrochlorothiazide (HYDRODIURIL) 25 MG tablet Take 25 mg by mouth daily.   Yes [provider]  Lactobacillus (ACIDOPHILUS) 100 MG CAPS Take by mouth.   Yes [provider]  loratadine (CLARITIN) 10 MG tablet Take 10 mg by mouth daily.   Yes [provider]  losartan (COZAAR) 50 MG tablet Take 75 mg by mouth daily.   Yes [provider]  Melatonin 5 MG TABS Take 1 tablet by mouth at bedtime.   Yes [provider]  metFORMIN (GLUCOPHAGE) 500 MG tablet Take by mouth 2 (two) times daily with a meal.   Yes [provider]  Probiotic Product (RISAQUAD-2 PO) Take 2 capsules by mouth daily.   Yes [provider]  atorvastatin (LIPITOR) 40 MG tablet Take 40 mg by mouth daily.    [provider]  dicyclomine (BENTYL) 10 MG capsule Take 1-2 capsules (10-20 mg total) by mouth every 8 (eight) hours as needed for spasms. Patient not taking: Reported on 02/06/2018 11/09/16   Yetta Flock, MD  fexofenadine (ALLEGRA) 30 MG tablet Take 30 mg by mouth 2 (two) times daily.    [provider]  fluticasone (FLONASE) 50 MCG/ACT nasal spray Place into both nostrils daily.    [provider]  ondansetron (ZOFRAN ODT) 4 MG disintegrating tablet Take 1 tablet (4 mg total) by mouth every 6 (six) hours as needed for nausea or vomiting. Patient not taking: Reported on 02/06/2018 11/09/16   Armbruster, Carlota Raspberry, MD    Family History Family History  Problem Relation Age of Onset  . Hypertension Mother   . Hyperlipidemia Mother   . Heart disease Mother   . Breast cancer Mother   . Diabetes Father   . Hypertension Father   . Stroke Father   . Prostate cancer Father   . Diabetes Sister   . Diabetes Brother        x 2  . Heart attack Maternal Grandmother   . Heart attack Maternal Grandfather   . Thyroid cancer Paternal Grandmother   . Hyperthyroidism Maternal Aunt        x 2    Social History Social History   Tobacco Use  . Smoking status: Never Smoker  . Smokeless tobacco: Never Used  Substance Use Topics  . Alcohol use: No  . Drug use: No     Allergies   Cefdinir; Clindamycin/lincomycin; Codeine; Cymbalta [duloxetine hcl]; Sulfamethizole; Tuberculin tests; and Oxycodone   Review of Systems Review of Systems  Gastrointestinal: Positive for abdominal pain,  diarrhea (x 1 month), nausea and vomiting.  All other systems reviewed and are negative.    Physical Exam Updated Vital Signs BP (!) 147/79   Pulse 98   Temp 98.5 F (36.9 C) (Oral)   Resp (!) 23   Ht 5\' 2"  (1.575 m)   SpO2 93%   BMI 32.37 kg/m   Physical Exam  Constitutional: She is oriented to person, place, and time. She appears well-developed and well-nourished. No distress.  Non toxic  HENT:  Head: Normocephalic and  atraumatic.  Nose: Nose normal.  Moist mucous membranes   Eyes: Pupils are equal, round, and reactive to light. Conjunctivae and EOM are normal.  Neck: Normal range of motion.  Cardiovascular: Normal rate, regular rhythm and intact distal pulses.  2+ DP and radial pulses bilaterally. No LE edema.   Pulmonary/Chest: Effort normal and breath sounds normal.  Abdominal: Soft. Bowel sounds are normal. There is tenderness in the right lower quadrant. There is guarding and tenderness at McBurney's point.  Positive McBurney's with guarding. Milder tenderness to LLQ and left mid abdomen. Negative Murphy's. No distention. Soft. No suprapubic or CVAT.   Musculoskeletal: Normal range of motion.  Neurological: She is alert and oriented to person, place, and time.  Skin: Skin is warm and dry. Capillary refill takes less than 2 seconds.  Psychiatric: She has a normal mood and affect. Her behavior is normal.  Nursing note and vitals reviewed.  ED Treatments / Results  Labs (all labs ordered are listed, but only abnormal results are displayed) Labs Reviewed  COMPREHENSIVE METABOLIC PANEL - Abnormal; Notable for the following components:      Result Value   Potassium 2.7 (*)    Chloride 95 (*)    Glucose, Bld 115 (*)    All other components within normal limits  CBC WITH DIFFERENTIAL/PLATELET  LIPASE, BLOOD  HEMOGLOBIN A1C    EKG None  Radiology Ct Abdomen Pelvis W Contrast  Result Date: 02/06/2018 CLINICAL DATA:  Diffuse abd pain, nausea. EXAM: CT ABDOMEN AND  PELVIS WITH CONTRAST TECHNIQUE: Multidetector CT imaging of the abdomen and pelvis was performed using the standard protocol following bolus administration of intravenous contrast. CONTRAST:  122mL ISOVUE-300 IOPAMIDOL (ISOVUE-300) INJECTION 61%, 8mL OMNIPAQUE IOHEXOL 300 MG/ML SOLN COMPARISON:  CT of the abdomen and pelvis on 02/23/2016 FINDINGS: Lower chest: There is minimal bibasilar atelectasis. Heart size is normal. No pericardial effusion or significant coronary artery calcifications. Hepatobiliary: The liver is diffusely low attenuation. No suspicious liver mass. The gallbladder is present. Pancreas: Unremarkable. No pancreatic ductal dilatation or surrounding inflammatory changes. Spleen: Normal in size without focal abnormality. Adrenals/Urinary Tract: Adrenal glands are unremarkable. Kidneys are normal, without renal calculi, focal lesion, or hydronephrosis. Bladder is unremarkable. Stomach/Bowel: Small hiatal hernia. The stomach otherwise is normal in appearance. Small bowel loops are normal in appearance. There are numerous colonic diverticula, primarily involving the sigmoid colon. However there is no acute inflammatory change to indicate acute diverticulitis. The appendix is thickened, measuring 14 millimeters on image 52 of series 2. There is no inflammatory change in the RIGHT LOWER QUADRANT. No abscess or bowel obstruction. Vascular/Lymphatic: There is atherosclerotic calcification of the abdominal aorta not associated with aneurysm. Although involved by atherosclerosis, there is vascular opacification of the celiac axis, superior mesenteric artery, and inferior mesenteric artery. Normal appearance of the portal venous system and inferior vena cava. Reproductive: Hysterectomy.  No adnexal mass. Other: There is a small amount of free pelvic fluid, increased over prior. Ventriculoperitoneal shunt terminates in the RIGHT mid abdomen. Musculoskeletal: Degenerative changes are seen in the LOWER thoracic  and lumbar spine. No suspicious lytic or blastic lesions are identified. Remote ORIF of the LEFT greater trochanter. IMPRESSION: 1. Thickened appendix suspicious for acute appendicitis. There is a small amount of free pelvic fluid. No abscess or RIGHT LOWER QUADRANT inflammatory change. 2. Hepatic steatosis. 3. Unremarkable course of ventriculoperitoneal shunt. 4. Small hiatal hernia. 5.  Aortic atherosclerosis.  (ICD10-I70.0) 6. Colonic diverticulosis without acute diverticulitis. 7. These results will be called  to the ordering clinician or representative by the Radiologist Assistant, and communication documented in the PACS or zVision Dashboard. Electronically Signed   By: Nolon Nations M.D.   On: 02/06/2018 17:05    Procedures Procedures (including critical care time)  Medications Ordered in ED Medications  enoxaparin (LOVENOX) injection 40 mg (has no administration in time range)  lactated ringers infusion (has no administration in time range)  acetaminophen (TYLENOL) tablet 650 mg (has no administration in time range)    Or  acetaminophen (TYLENOL) suppository 650 mg (has no administration in time range)  diphenhydrAMINE (BENADRYL) 12.5 MG/5ML elixir 12.5 mg (has no administration in time range)    Or  diphenhydrAMINE (BENADRYL) injection 12.5 mg (has no administration in time range)  ondansetron (ZOFRAN-ODT) disintegrating tablet 4 mg (has no administration in time range)    Or  ondansetron (ZOFRAN) injection 4 mg (has no administration in time range)  simethicone (MYLICON) chewable tablet 40 mg (has no administration in time range)  ciprofloxacin (CIPRO) IVPB 400 mg (has no administration in time range)    And  metroNIDAZOLE (FLAGYL) IVPB 500 mg (has no administration in time range)  lactated ringers bolus 1,000 mL (has no administration in time range)  methocarbamol (ROBAXIN) 1,000 mg in dextrose 5 % 50 mL IVPB (has no administration in time range)  HYDROcodone-acetaminophen  (NORCO/VICODIN) 5-325 MG per tablet 1-2 tablet (has no administration in time range)  prochlorperazine (COMPAZINE) injection 5-10 mg (has no administration in time range)  lip balm (CARMEX) ointment 1 application (has no administration in time range)  magic mouthwash (has no administration in time range)  saccharomyces boulardii (FLORASTOR) capsule 250 mg (has no administration in time range)  psyllium (HYDROCIL/METAMUCIL) packet 1 packet (has no administration in time range)  bismuth subsalicylate (PEPTO BISMOL) 262 MG/15ML suspension 30 mL (has no administration in time range)  feeding supplement (GLUCERNA SHAKE) (GLUCERNA SHAKE) liquid 237 mL (has no administration in time range)  guaiFENesin-dextromethorphan (ROBITUSSIN DM) 100-10 MG/5ML syrup 10 mL (has no administration in time range)  hydrocortisone (ANUSOL-HC) 2.5 % rectal cream 1 application (has no administration in time range)  alum & mag hydroxide-simeth (MAALOX/MYLANTA) 200-200-20 MG/5ML suspension 30 mL (has no administration in time range)  hydrocortisone cream 1 % 1 application (has no administration in time range)  menthol-cetylpyridinium (CEPACOL) lozenge 3 mg (has no administration in time range)  phenol (CHLORASEPTIC) mouth spray 1-2 spray (has no administration in time range)  insulin aspart (novoLOG) injection 0-15 Units (has no administration in time range)  Chlorhexidine Gluconate Cloth 2 % PADS 6 each (has no administration in time range)    And  Chlorhexidine Gluconate Cloth 2 % PADS 6 each (has no administration in time range)  gabapentin (NEURONTIN) capsule 300 mg (has no administration in time range)  acetaminophen (TYLENOL) tablet 1,000 mg (has no administration in time range)  metroNIDAZOLE (FLAGYL) IVPB 500 mg (has no administration in time range)    And  ciprofloxacin (CIPRO) IVPB 400 mg (has no administration in time range)  potassium chloride 10 mEq in 100 mL IVPB (has no administration in time range)    magnesium sulfate IVPB 2 g 50 mL (has no administration in time range)  sodium chloride 0.9 % bolus 1,000 mL (1,000 mLs Intravenous New Bag/Given 02/06/18 1900)  ondansetron (ZOFRAN) injection 4 mg (4 mg Intravenous Given 02/06/18 1900)  morphine 4 MG/ML injection 4 mg (4 mg Intravenous Given 02/06/18 1900)     Initial Impression / Assessment and Plan /  ED Course  I have reviewed the triage vital signs and the nursing notes.  Pertinent labs & imaging results that were available during my care of the patient were reviewed by me and considered in my medical decision making (see chart for details).  Clinical Course as of Feb 07 2007  Wed Feb 06, 2018  1835 IMPRESSION: 1. Thickened appendix suspicious for acute appendicitis. There is a small amount of free pelvic fluid. No abscess or RIGHT LOWER QUADRANT inflammatory change. 2. Hepatic steatosis. 3. Unremarkable course of ventriculoperitoneal shunt. 4. Small hiatal hernia. 5.  Aortic atherosclerosis.  (ICD10-I70.0) 6. Colonic diverticulosis without acute diverticulitis. 7. These results will be called to the ordering clinician or representative by the Radiologist Assistant, and communication documented in the PACS or zVision Dashboard.   [CG]    Clinical Course User Index [CG] Kinnie Feil, PA-C   1900: Spoke to Dr Johney Maine who will see patient in ER. Screening labs, IVF, antiemetics and analgesia ordered. Pt aware of plan and likely OR. Pt discussed with Dr Lacinda Axon who evaluated patient independently.   Final Clinical Impressions(s) / ED Diagnoses   2000: Pt admitted by Dr. Johney Maine for appendectomy.  WBC 10.0, K 2.7. Will order IV K and magnesium to replete. EKG ordered per anesthesiology protocol by admitting physician.  Final diagnoses:  Acute appendicitis, unspecified acute appendicitis type  Hypokalemia    ED Discharge Orders    None       Arlean Hopping 02/06/18 2008    Nat Christen, MD 02/10/18 [phone]

## 2018-02-06 NOTE — H&P (Signed)
[address] [place], [place] [address] Phone: [phone] FAX: [phone]     Brittany Atkins  11-01-1935 [id number]  CARE TEAM:  PCP: Angelica Pou, MD  Outpatient Care Team: Patient Care Team: Angelica Pou, MD as PCP - General (Internal Medicine) Roney Mans, FNP as Nurse Practitioner (Neurosurgery) Rhoton, Shelda Altes., MD as Consulting Physician (Gastroenterology) Al-Khori, Stann Mainland., MD as Consulting Physician (Cardiology)  Inpatient Treatment Team: Treatment Team: Attending Provider: Nolon Nations, MD; Physician Assistant: Arlean Hopping; Consulting Physician: Nolon Nations, MD; Registered Nurse: Marlene Bast, RN   This patient is a 82 y.o.female who presents today for surgical evaluation at the request of Dr Dorian Pod & Carmon Sails, PA.   Chief complaint / Reason for evaluation: Abdominal pain with appendicitis  82 year old obese female with numerous health issues including hydrocephalus with a VP shunt.  Has some irritable bowel irregular bowel issues occasional loose and hard.  Has some intermittent chronic abdominal pain.  About 2 days ago with intense pain.  Seems somewhat both sided but now is definitely in the right lower side.  Intensified and concerned her.  Saw primary care physician.  Apparently blood work showed an elevated white count.  CT scan done concerning for appendicitis.  Patient was told to come the emergency room for surgery.  We are notified me of this.  Patient's family including husband and daughter in the room.  She has had a history of TIAs but is just on a baby aspirin.  No definite cardiac dysrhythmias.  She does not smoke.  She had a hysterectomy.  Cyst removal but no other abdominal surgeries.  That was many decades ago.  VP shunt placed while she lived in [place].  All usually to the [facility] & at [place]  82 y.o. female     Procedure(s): APPENDECTOMY LAPAROSCOPIC  Problem List:  Principal Problem:   Acute appendicitis Active Problems:   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   Communicating hydrocephalus   Diabetes mellitus type 2 in obese (HCC)   Obstructive sleep apnea   History physical and CT scan suspicious for acute appendicitis in the setting of a VP shunt.  Plan:  Admit  IV fluids.  IV antibiotics.  Would do Zosyn for high risk given her VP shunt status.  Diagnostic laparoscopy appendectomy.  Possible repositioning of VP shunt.  Hopefully will not need to be exteriorized this seems relatively early but the have to change the plan depending on intraoperative findings.  Will defer advice with neurosurgery till tomorrow.  The anatomy & physiology of the digestive tract was discussed.  The pathophysiology of appendicitis and other appendiceal disorders were discussed.  Natural history risks without surgery was discussed.   I feel the risks of no intervention will lead to serious problems that outweigh the operative risks; therefore, I recommended diagnostic laparoscopy with removal of appendix to remove the pathology.  Laparoscopic & open techniques were discussed.   I noted a good likelihood this will help address the problem.   Risks such as bleeding, infection, abscess, leak, reoperation, injury to other organs, need for repair of tissues / organs, possible ostomy, hernia, heart attack, stroke, death, and other risks were discussed.  Goals of post-operative recovery were discussed as well.  We will work to minimize complications.  Questions were answered.  The patient expresses understanding &  wishes to proceed with surgery.    -VTE prophylaxis- SCDs, etc -mobilize as tolerated to help recovery  40 minutes spent in review, evaluation, examination, counseling, and coordination of care.  More than 50% of that time was spent in  counseling.  Adin Hector, M.D., F.A.C.S. Gastrointestinal and Minimally Invasive Surgery [facility] [address], [place], [place] [address] [phone] Main / Paging   02/06/2018      Past Medical History:  Diagnosis Date  . Allergy    seasonal  . Anxiety   . Asthma with allergic rhinitis   . Cataract    surgery  . Cervical radiculopathy   . Chronic kidney disease    ladder is not filling up/ usually incontinent  . Chronic pain   . Diabetes mellitus without complication ([place])   . Fibromyalgia   . GERD (gastroesophageal reflux disease)   . H/O TIA (transient ischemic attack) and stroke   . Headache   . HLD (hyperlipidemia)   . Hypertension   . IBS (irritable bowel syndrome)   . Insomnia   . OSA (obstructive sleep apnea)   . Osteoarthritis   . PAD (peripheral artery disease) ([place])   . Positive PPD   . Sleep apnea    doesn't use her CPAP  . Stroke [facility])    TIA's; MAy 2017  . Thyroid disease   . Tuberculosis    positive tb tests all of her life/ 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    Past Surgical History:  Procedure Laterality Date  . brain shunt     hydrocephalis  . CATARACT EXTRACTION, BILATERAL  2014  . HIP SURGERY  2009   hip muscle  . HYDROCELE EXCISION / REPAIR  07/2010  . LUMBAR LAMINECTOMY  2009  . OVARIAN CYST REMOVAL     with  fallopian tube  . TOTAL ABDOMINAL HYSTERECTOMY      Social History   Socioeconomic History  . Marital status: Married    Spouse name: Not on file  . Number of children: 3  . Years of education: college  . Highest education level: Not on file  Occupational History  . Occupation: retired  Scientific laboratory technician  . Financial resource strain: Not on file  . Food insecurity:    Worry: Not on file    Inability: Not on file  . Transportation needs:    Medical: Not on file    Non-medical: Not on file  Tobacco Use  . Smoking status: Never Smoker  . Smokeless tobacco: Never Used  Substance and Sexual  Activity  . Alcohol use: No  . Drug use: No  . Sexual activity: Not on file  Lifestyle  . Physical activity:    Days per week: Not on file    Minutes per session: Not on file  . Stress: Not on file  Relationships  . Social connections:    Talks on phone: Not on file    Gets together: Not on file    Attends religious service: Not on file    Active member of club or organization: Not on file    Attends meetings of clubs or organizations: Not on file    Relationship status: Not on file  . Intimate partner violence:    Fear of current or ex partner: Not on file    Emotionally abused: Not on file    Physically abused: Not on file    Forced sexual activity: Not on file  Other Topics Concern  .  Not on file  Social History Narrative   Drinks 1 cup of coffee a day, also drinks green tea     Family History  Problem Relation Age of Onset  . Hypertension Mother   . Hyperlipidemia Mother   . Heart disease Mother   . Breast cancer Mother   . Diabetes Father   . Hypertension Father   . Stroke Father   . Prostate cancer Father   . Diabetes Sister   . Diabetes Brother        x 2  . Heart attack Maternal Grandmother   . Heart attack Maternal Grandfather   . Thyroid cancer Paternal Grandmother   . Hyperthyroidism Maternal Aunt        x 2    Current Facility-Administered Medications  Medication Dose Route Frequency Provider Last Rate Last Dose  . 0.9 %  sodium chloride infusion  500 mL Intravenous Continuous Armbruster, Carlota Raspberry, MD      . acetaminophen (TYLENOL) tablet 650 mg  650 mg Oral Q6H PRN Michael Boston, MD       Or  . acetaminophen (TYLENOL) suppository 650 mg  650 mg Rectal Q6H PRN Michael Boston, MD      . Derrill Memo ON 02/07/2018] acetaminophen (TYLENOL) tablet 1,000 mg  1,000 mg Oral On Call to OR Michael Boston, MD      . alum & mag hydroxide-simeth (MAALOX/MYLANTA) 200-200-20 MG/5ML suspension 30 mL  30 mL Oral Q6H PRN Michael Boston, MD      . bismuth subsalicylate (PEPTO  BISMOL) 262 MG/15ML suspension 30 mL  30 mL Oral Q8H PRN Michael Boston, MD      . Chlorhexidine Gluconate Cloth 2 % PADS 6 each  6 each Topical Once Michael Boston, MD       And  . Chlorhexidine Gluconate Cloth 2 % PADS 6 each  6 each Topical Once Michael Boston, MD      . ciprofloxacin (CIPRO) IVPB 400 mg  400 mg Intravenous Gorden Harms, MD       And  . metroNIDAZOLE (FLAGYL) IVPB 500 mg  500 mg Intravenous Tor Netters, MD      . Derrill Memo ON 02/07/2018] metroNIDAZOLE (FLAGYL) IVPB 500 mg  500 mg Intravenous On Call to OR Michael Boston, MD       And  . Derrill Memo ON 02/07/2018] ciprofloxacin (CIPRO) IVPB 400 mg  400 mg Intravenous On Call to OR Michael Boston, MD      . diphenhydrAMINE (BENADRYL) 12.5 MG/5ML elixir 12.5 mg  12.5 mg Oral Q6H PRN Michael Boston, MD       Or  . diphenhydrAMINE (BENADRYL) injection 12.5 mg  12.5 mg Intravenous Q6H PRN Michael Boston, MD      . enoxaparin (LOVENOX) injection 40 mg  40 mg Subcutaneous Q24H Michael Boston, MD      . Derrill Memo ON 02/07/2018] feeding supplement (GLUCERNA SHAKE) (GLUCERNA SHAKE) liquid 237 mL  237 mL Oral BID BM Michael Boston, MD      . Derrill Memo ON 02/07/2018] gabapentin (NEURONTIN) capsule 300 mg  300 mg Oral On Call to OR Michael Boston, MD      . guaiFENesin-dextromethorphan (ROBITUSSIN DM) 100-10 MG/5ML syrup 10 mL  10 mL Oral Q4H PRN Michael Boston, MD      . HYDROcodone-acetaminophen (NORCO/VICODIN) 5-325 MG per tablet 1-2 tablet  1-2 tablet Oral Q4H PRN Michael Boston, MD      . hydrocortisone (ANUSOL-HC) 2.5 % rectal cream 1 application  1 application Topical  QID PRN Michael Boston, MD      . hydrocortisone cream 1 % 1 application  1 application Topical TID PRN Michael Boston, MD      . insulin aspart (novoLOG) injection 0-15 Units  0-15 Units Subcutaneous Q4H Michael Boston, MD      . lactated ringers bolus 1,000 mL  1,000 mL Intravenous Q8H PRN Michael Boston, MD      . lactated ringers infusion   Intravenous Continuous Michael Boston, MD       . lip balm (CARMEX) ointment 1 application  1 application Topical BID Michael Boston, MD      . magic mouthwash  15 mL Oral QID PRN Michael Boston, MD      . menthol-cetylpyridinium (CEPACOL) lozenge 3 mg  1 lozenge Oral PRN Michael Boston, MD      . methocarbamol (ROBAXIN) 1,000 mg in dextrose 5 % 50 mL IVPB  1,000 mg Intravenous Q6H PRN Michael Boston, MD      . ondansetron (ZOFRAN-ODT) disintegrating tablet 4 mg  4 mg Oral Q6H PRN Michael Boston, MD       Or  . ondansetron (ZOFRAN) injection 4 mg  4 mg Intravenous Q6H PRN Michael Boston, MD      . phenol (CHLORASEPTIC) mouth spray 1-2 spray  1-2 spray Mouth/Throat PRN Michael Boston, MD      . prochlorperazine (COMPAZINE) injection 5-10 mg  5-10 mg Intravenous Q4H PRN Michael Boston, MD      . psyllium (HYDROCIL/METAMUCIL) packet 1 packet  1 packet Oral Daily Michael Boston, MD      . saccharomyces boulardii (FLORASTOR) capsule 250 mg  250 mg Oral BID Michael Boston, MD      . simethicone [facility]) chewable tablet 40 mg  40 mg Oral Q6H PRN Michael Boston, MD      . sodium chloride 0.9 % bolus 1,000 mL  1,000 mL Intravenous Once Kinnie Feil, PA-C 984 mL/hr at 02/06/18 1900 1,000 mL at 02/06/18 1900   Current Outpatient Medications  Medication Sig Dispense Refill  . ACYCLOVIR PO Take 1 tablet by mouth daily as needed (fever blisters).    Marland Kitchen albuterol (PROAIR HFA) 108 (90 Base) MCG/ACT inhaler Inhale 2 puffs into the lungs every 6 (six) hours as needed for wheezing or shortness of breath.     Marland Kitchen aspirin 81 MG tablet Take 81 mg by mouth daily.    Marland Kitchen azelastine (OPTIVAR) 0.05 % ophthalmic solution Place 1 drop into both eyes 2 (two) times daily.     . famotidine (PEPCID) 40 MG tablet Take 40 mg by mouth daily.    Marland Kitchen gabapentin (NEURONTIN) 600 MG tablet Take 600 mg by mouth 3 (three) times daily.     . hydrochlorothiazide (HYDRODIURIL) 25 MG tablet Take 25 mg by mouth daily.    . Lactobacillus (ACIDOPHILUS) 100 MG CAPS Take by mouth.    . loratadine  (CLARITIN) 10 MG tablet Take 10 mg by mouth daily.    Marland Kitchen losartan (COZAAR) 50 MG tablet Take 75 mg by mouth daily.    . Melatonin 5 MG TABS Take 1 tablet by mouth at bedtime.    . metFORMIN (GLUCOPHAGE) 500 MG tablet Take by mouth 2 (two) times daily with a meal.    . Probiotic Product (RISAQUAD-2 PO) Take 2 capsules by mouth daily.    Marland Kitchen atorvastatin (LIPITOR) 40 MG tablet Take 40 mg by mouth daily.    Marland Kitchen dicyclomine (BENTYL) 10 MG capsule Take 1-2 capsules (10-20 mg  total) by mouth every 8 (eight) hours as needed for spasms. (Patient not taking: Reported on 02/06/2018) 90 capsule 3  . fexofenadine (ALLEGRA) 30 MG tablet Take 30 mg by mouth 2 (two) times daily.    . fluticasone (FLONASE) 50 MCG/ACT nasal spray Place into both nostrils daily.    . ondansetron (ZOFRAN ODT) 4 MG disintegrating tablet Take 1 tablet (4 mg total) by mouth every 6 (six) hours as needed for nausea or vomiting. (Patient not taking: Reported on 02/06/2018) 90 tablet 3   Facility-Administered Medications Ordered in Other Encounters  Medication Dose Route Frequency Provider Last Rate Last Dose  . iopamidol (ISOVUE-300) 61 % injection              Allergies  Allergen Reactions  . Cefdinir   . Clindamycin/Lincomycin   . Codeine Nausea Only  . Cymbalta [Duloxetine Hcl]   . Sulfamethizole   . Tuberculin Tests     Hx of positive PPd  . Oxycodone Rash    ROS:   All other systems reviewed & are negative except per HPI or as noted below: Constitutional:  No fevers, chills, sweats.  Weight stable Eyes:  No vision changes, No discharge HENT:  No sore throats, nasal drainage Lymph: No neck swelling, No bruising easily Pulmonary:  No cough, productive sputum CV: No orthopnea, PND  Patient walks some gait issues.  No exertional chest/neck/shoulder/arm pain. GI: No personal nor family history of GI/colon cancer, inflammatory bowel disease, allergy such as Celiac Sprue, dietary/dairy problems, colitis, ulcers nor gastritis.  No  recent sick contacts/gastroenteritis.  No travel outside the country.  No changes in diet.  Regular bowels.  Sometimes this. Renal: No UTIs, No hematuria Genital:  No drainage, bleeding, masses Musculoskeletal: No severe joint pain.  Good ROM major joints Skin:  No sores or lesions.  No rashes Heme/Lymph:  No easy bleeding.  No swollen lymph nodes Neuro: No focal weakness/numbness.  No seizures.  VP shunt in place & stable.  No recent infections or problems Psych: No suicidal ideation.  No hallucinations  BP (!) 159/80 (BP Location: Left Arm)   Pulse 79   Temp 98.5 F (36.9 C) (Oral)   Resp 18   Ht 5\' 2"  (1.575 m)   SpO2 95%   BMI 32.37 kg/m   Physical Exam: General: Pt awake/alert/oriented x4 in mild major acute distress Eyes: PERRL, normal EOM. Sclera nonicteric Neuro: CN II-XII intact w/o focal sensory/motor deficits. Lymph: No head/neck/groin lymphadenopathy Psych:  No delerium/psychosis/paranoia.   HENT: Normocephalic, Mucus membranes moist.  No thrush Neck: Supple, No tracheal deviation Chest: No pain.  Good respiratory excursion. CV:  Pulses intact.  Regular rhythm Abdomen: Soft, obese.  Tenderness in right lower quadrant with some guarding McBurney's point.  Somewhat right suprapubic region as well.  Not so much at the flanks.  Left abdomen nontender.  No definite Rovsing sign.  No hernias. Gen:  No inguinal hernias.  No inguinal lymphadenopathy.   Ext:  SCDs BLE.  No significant edema.  No cyanosis Skin: No petechiae / purpurea.  No major sores Musculoskeletal: No severe joint pain.  Good ROM major joints   Results:   Labs: Results for orders placed or performed during the hospital encounter of 02/06/18 (from the past 48 hour(s))  CBC with Differential     Status: None   Collection Time: 02/06/18  6:59 PM  Result Value Ref Range   WBC 10.0 4.0 - 10.5 K/uL   RBC 4.30 3.87 - 5.11  MIL/uL   Hemoglobin 12.9 12.0 - 15.0 g/dL   HCT 37.7 36.0 - 46.0 %   MCV 87.7 78.0 -  100.0 fL   MCH 30.0 26.0 - 34.0 pg   MCHC 34.2 30.0 - 36.0 g/dL   RDW 12.8 11.5 - 15.5 %   Platelets 190 150 - 400 K/uL   Neutrophils Relative % 72 %   Neutro Abs 7.3 1.7 - 7.7 K/uL   Lymphocytes Relative 17 %   Lymphs Abs 1.7 0.7 - 4.0 K/uL   Monocytes Relative 10 %   Monocytes Absolute 1.0 0.1 - 1.0 K/uL   Eosinophils Relative 1 %   Eosinophils Absolute 0.1 0.0 - 0.7 K/uL   Basophils Relative 0 %   Basophils Absolute 0.0 0.0 - 0.1 K/uL    Comment: Performed at [facility], [place] [address] [place], [place] [address]    Imaging / Studies: Ct Abdomen Pelvis W Contrast  Result Date: 02/06/2018 CLINICAL DATA:  Diffuse abd pain, nausea. EXAM: CT ABDOMEN AND PELVIS WITH CONTRAST TECHNIQUE: Multidetector CT imaging of the abdomen and pelvis was performed using the standard protocol following bolus administration of intravenous contrast. CONTRAST:  112mL ISOVUE-300 IOPAMIDOL (ISOVUE-300) INJECTION 61%, 79mL OMNIPAQUE IOHEXOL 300 MG/ML SOLN COMPARISON:  CT of the abdomen and pelvis on 02/23/2016 FINDINGS: Lower chest: There is minimal bibasilar atelectasis. Heart size is normal. No pericardial effusion or significant coronary artery calcifications. Hepatobiliary: The liver is diffusely low attenuation. No suspicious liver mass. The gallbladder is present. Pancreas: Unremarkable. No pancreatic ductal dilatation or surrounding inflammatory changes. Spleen: Normal in size without focal abnormality. Adrenals/Urinary Tract: Adrenal glands are unremarkable. Kidneys are normal, without renal calculi, focal lesion, or hydronephrosis. Bladder is unremarkable. Stomach/Bowel: Small hiatal hernia. The stomach otherwise is normal in appearance. Small bowel loops are normal in appearance. There are numerous colonic diverticula, primarily involving the sigmoid colon. However there is no acute inflammatory change to indicate acute diverticulitis. The appendix is thickened, measuring 14 millimeters  on image 52 of series 2. There is no inflammatory change in the RIGHT LOWER QUADRANT. No abscess or bowel obstruction. Vascular/Lymphatic: There is atherosclerotic calcification of the abdominal aorta not associated with aneurysm. Although involved by atherosclerosis, there is vascular opacification of the celiac axis, superior mesenteric artery, and inferior mesenteric artery. Normal appearance of the portal venous system and inferior vena cava. Reproductive: Hysterectomy.  No adnexal mass. Other: There is a small amount of free pelvic fluid, increased over prior. Ventriculoperitoneal shunt terminates in the RIGHT mid abdomen. Musculoskeletal: Degenerative changes are seen in the LOWER thoracic and lumbar spine. No suspicious lytic or blastic lesions are identified. Remote ORIF of the LEFT greater trochanter. IMPRESSION: 1. Thickened appendix suspicious for acute appendicitis. There is a small amount of free pelvic fluid. No abscess or RIGHT LOWER QUADRANT inflammatory change. 2. Hepatic steatosis. 3. Unremarkable course of ventriculoperitoneal shunt. 4. Small hiatal hernia. 5.  Aortic atherosclerosis.  (ICD10-I70.0) 6. Colonic diverticulosis without acute diverticulitis. 7. These results will be called to the ordering clinician or representative by the Radiologist Assistant, and communication documented in the PACS or zVision Dashboard. Electronically Signed   By: Nolon Nations M.D.   On: 02/06/2018 17:05    Medications / Allergies: per chart  Antibiotics: Anti-infectives (From admission, onward)   Start     Dose/Rate Route Frequency Ordered Stop   02/07/18 0600  metroNIDAZOLE (FLAGYL) IVPB 500 mg     500 mg 100 mL/hr over 60 Minutes Intravenous On call  to O.R. 02/06/18 1946 02/08/18 0559   02/07/18 0600  ciprofloxacin (CIPRO) IVPB 400 mg     400 mg 200 mL/hr over 60 Minutes Intravenous On call to O.R. 02/06/18 1946 02/08/18 0559   02/06/18 1945  ciprofloxacin (CIPRO) IVPB 400 mg     400 mg 200  mL/hr over 60 Minutes Intravenous Every 12 hours 02/06/18 1944     02/06/18 1945  metroNIDAZOLE (FLAGYL) IVPB 500 mg     500 mg 100 mL/hr over 60 Minutes Intravenous Every 8 hours 02/06/18 1944          Note: Portions of this report may have been transcribed using voice recognition software. Every effort was made to ensure accuracy; however, inadvertent computerized transcription errors may be present.   Any transcriptional errors that result from this process are unintentional.    Adin Hector, M.D., F.A.C.S. Gastrointestinal and Minimally Invasive Surgery [facility] [address], [place], [place] [address] [phone] Main / Paging   02/06/2018

## 2018-02-07 ENCOUNTER — Other Ambulatory Visit: Payer: Self-pay

## 2018-02-07 ENCOUNTER — Encounter (HOSPITAL_COMMUNITY): Payer: Self-pay | Admitting: Certified Registered Nurse Anesthetist

## 2018-02-07 ENCOUNTER — Inpatient Hospital Stay (HOSPITAL_COMMUNITY): Payer: Medicare (Managed Care) | Admitting: Certified Registered Nurse Anesthetist

## 2018-02-07 ENCOUNTER — Encounter (HOSPITAL_COMMUNITY): Admission: EM | Disposition: A | Discharge status: Home / Self Care | Payer: Self-pay | Source: Home / Self Care

## 2018-02-07 HISTORY — PX: LAPAROSCOPIC ABDOMINAL EXPLORATION: SHX6249

## 2018-02-07 LAB — BASIC METABOLIC PANEL
ANION GAP: 13 (ref 5–15)
BUN: 10 mg/dL (ref 6–20)
CALCIUM: 8 mg/dL — AB (ref 8.9–10.3)
CO2: 25 mmol/L (ref 22–32)
CREATININE: 0.85 mg/dL (ref 0.44–1.00)
Chloride: 95 mmol/L — ABNORMAL LOW (ref 101–111)
GLUCOSE: 168 mg/dL — AB (ref 65–99)
Potassium: 2.5 mmol/L — CL (ref 3.5–5.1)
Sodium: 133 mmol/L — ABNORMAL LOW (ref 135–145)

## 2018-02-07 LAB — GLUCOSE, CAPILLARY
GLUCOSE-CAPILLARY: 122 mg/dL — AB (ref 65–99)
GLUCOSE-CAPILLARY: 149 mg/dL — AB (ref 65–99)
GLUCOSE-CAPILLARY: 170 mg/dL — AB (ref 65–99)
GLUCOSE-CAPILLARY: 186 mg/dL — AB (ref 65–99)
GLUCOSE-CAPILLARY: 215 mg/dL — AB (ref 65–99)
Glucose-Capillary: 162 mg/dL — ABNORMAL HIGH (ref 65–99)
Glucose-Capillary: 164 mg/dL — ABNORMAL HIGH (ref 65–99)

## 2018-02-07 LAB — GENTAMICIN LEVEL, RANDOM: GENTAMICIN RM: 5.7 ug/mL

## 2018-02-07 LAB — MAGNESIUM: Magnesium: 1.9 mg/dL (ref 1.7–2.4)

## 2018-02-07 LAB — SURGICAL PCR SCREEN
MRSA, PCR: NEGATIVE
STAPHYLOCOCCUS AUREUS: NEGATIVE

## 2018-02-07 SURGERY — EXPLORATION, ABDOMEN, LAPAROSCOPIC
Anesthesia: General | Site: Abdomen

## 2018-02-07 MED ORDER — HYDROCHLOROTHIAZIDE 25 MG PO TABS
25.0000 mg | ORAL_TABLET | Freq: Every day | ORAL | Status: DC
  Administered 2018-02-07 – 2018-02-09 (×3): 25 mg via ORAL
  Filled 2018-02-07 (×3): qty 1

## 2018-02-07 MED ORDER — LACTATED RINGERS IV SOLN: INTRAVENOUS | Status: DC | PRN

## 2018-02-07 MED ORDER — LIDOCAINE HCL (CARDIAC) PF 100 MG/5ML IV SOSY
PREFILLED_SYRINGE | INTRAVENOUS | Status: DC | PRN
  Administered 2018-02-07: 40 mg via INTRATRACHEAL

## 2018-02-07 MED ORDER — ROCURONIUM BROMIDE 100 MG/10ML IV SOLN
INTRAVENOUS | Status: DC | PRN
  Administered 2018-02-07: 40 mg via INTRAVENOUS

## 2018-02-07 MED ORDER — LACTATED RINGERS IV SOLN
INTRAVENOUS | Status: DC
  Filled 2018-02-07 (×2): qty 1000

## 2018-02-07 MED ORDER — FENTANYL CITRATE (PF) 100 MCG/2ML IJ SOLN
25.0000 ug | INTRAMUSCULAR | Status: DC | PRN
  Administered 2018-02-07: 50 ug via INTRAVENOUS
  Administered 2018-02-07 (×2): 25 ug via INTRAVENOUS

## 2018-02-07 MED ORDER — LACTATED RINGERS IR SOLN
Status: DC | PRN
  Administered 2018-02-07: 1000 mL

## 2018-02-07 MED ORDER — METFORMIN HCL 500 MG PO TABS
500.0000 mg | ORAL_TABLET | Freq: Two times a day (BID) | ORAL | Status: DC
  Administered 2018-02-07 – 2018-02-09 (×4): 500 mg via ORAL
  Filled 2018-02-07 (×4): qty 1

## 2018-02-07 MED ORDER — SUGAMMADEX SODIUM 200 MG/2ML IV SOLN
INTRAVENOUS | Status: AC
  Filled 2018-02-07: qty 2

## 2018-02-07 MED ORDER — MORPHINE SULFATE (PF) 2 MG/ML IV SOLN
1.0000 mg | INTRAVENOUS | Status: DC | PRN
  Administered 2018-02-07: 2 mg via INTRAVENOUS
  Filled 2018-02-07: qty 1

## 2018-02-07 MED ORDER — POTASSIUM CHLORIDE IN NACL 20-0.45 MEQ/L-% IV SOLN
INTRAVENOUS | Status: DC
  Administered 2018-02-07: 14:00:00 via INTRAVENOUS
  Filled 2018-02-07: qty 1000

## 2018-02-07 MED ORDER — FENTANYL CITRATE (PF) 100 MCG/2ML IJ SOLN: 25.0000 ug | INTRAMUSCULAR | Status: DC | PRN

## 2018-02-07 MED ORDER — POTASSIUM CHLORIDE 10 MEQ/100ML IV SOLN
10.0000 meq | INTRAVENOUS | Status: AC
  Administered 2018-02-07 (×3): 10 meq via INTRAVENOUS
  Filled 2018-02-07 (×3): qty 100

## 2018-02-07 MED ORDER — ALBUTEROL SULFATE HFA 108 (90 BASE) MCG/ACT IN AERS
INHALATION_SPRAY | RESPIRATORY_TRACT | Status: DC | PRN
  Administered 2018-02-07: 2 via RESPIRATORY_TRACT

## 2018-02-07 MED ORDER — ALBUTEROL SULFATE HFA 108 (90 BASE) MCG/ACT IN AERS
INHALATION_SPRAY | RESPIRATORY_TRACT | Status: AC
  Filled 2018-02-07: qty 6.7

## 2018-02-07 MED ORDER — LOSARTAN POTASSIUM 50 MG PO TABS
75.0000 mg | ORAL_TABLET | Freq: Every day | ORAL | Status: DC
  Administered 2018-02-07 – 2018-02-09 (×3): 75 mg via ORAL
  Filled 2018-02-07 (×3): qty 1

## 2018-02-07 MED ORDER — PROPOFOL 10 MG/ML IV BOLUS
INTRAVENOUS | Status: DC | PRN
  Administered 2018-02-07: 170 mg via INTRAVENOUS

## 2018-02-07 MED ORDER — MELATONIN 5 MG PO TABS
1.0000 | ORAL_TABLET | Freq: Every day | ORAL | Status: DC
  Administered 2018-02-07: 5 mg via ORAL
  Filled 2018-02-07 (×2): qty 1

## 2018-02-07 MED ORDER — LIP MEDEX EX OINT
TOPICAL_OINTMENT | CUTANEOUS | Status: AC
  Filled 2018-02-07: qty 7

## 2018-02-07 MED ORDER — SUCCINYLCHOLINE CHLORIDE 20 MG/ML IJ SOLN
INTRAMUSCULAR | Status: DC | PRN
  Administered 2018-02-07: 120 mg via INTRAVENOUS

## 2018-02-07 MED ORDER — ONDANSETRON 4 MG PO TBDP: 4.0000 mg | ORAL_TABLET | Freq: Four times a day (QID) | ORAL | Status: DC | PRN

## 2018-02-07 MED ORDER — FENTANYL CITRATE (PF) 100 MCG/2ML IJ SOLN
INTRAMUSCULAR | Status: AC
  Filled 2018-02-07: qty 2

## 2018-02-07 MED ORDER — PROPOFOL 10 MG/ML IV BOLUS
INTRAVENOUS | Status: AC
  Filled 2018-02-07: qty 20

## 2018-02-07 MED ORDER — GABAPENTIN 300 MG PO CAPS
600.0000 mg | ORAL_CAPSULE | Freq: Three times a day (TID) | ORAL | Status: DC
  Administered 2018-02-07 – 2018-02-09 (×6): 600 mg via ORAL
  Filled 2018-02-07 (×6): qty 2

## 2018-02-07 MED ORDER — 0.9 % SODIUM CHLORIDE (POUR BTL) OPTIME
TOPICAL | Status: DC | PRN
  Administered 2018-02-07: 1000 mL

## 2018-02-07 MED ORDER — POTASSIUM CHLORIDE IN NACL 20-0.45 MEQ/L-% IV SOLN
INTRAVENOUS | Status: DC
  Administered 2018-02-07 – 2018-02-08 (×2): via INTRAVENOUS
  Filled 2018-02-07 (×4): qty 1000

## 2018-02-07 MED ORDER — ONDANSETRON HCL 4 MG/2ML IJ SOLN
INTRAMUSCULAR | Status: AC
  Administered 2018-02-07: 4 mg via INTRAVENOUS
  Filled 2018-02-07: qty 2

## 2018-02-07 MED ORDER — ONDANSETRON HCL 4 MG/2ML IJ SOLN
INTRAMUSCULAR | Status: AC
  Filled 2018-02-07: qty 2

## 2018-02-07 MED ORDER — ONDANSETRON HCL 4 MG/2ML IJ SOLN
4.0000 mg | Freq: Once | INTRAMUSCULAR | Status: AC | PRN
  Administered 2018-02-07: 4 mg via INTRAVENOUS

## 2018-02-07 MED ORDER — ONDANSETRON HCL 4 MG/2ML IJ SOLN: 4.0000 mg | Freq: Four times a day (QID) | INTRAMUSCULAR | Status: DC | PRN

## 2018-02-07 MED ORDER — SUGAMMADEX SODIUM 200 MG/2ML IV SOLN
INTRAVENOUS | Status: DC | PRN
  Administered 2018-02-07: 200 mg via INTRAVENOUS

## 2018-02-07 MED ORDER — ONDANSETRON HCL 4 MG/2ML IJ SOLN
INTRAMUSCULAR | Status: DC | PRN
  Administered 2018-02-07: 4 mg via INTRAVENOUS

## 2018-02-07 MED ORDER — FAMOTIDINE 20 MG PO TABS
40.0000 mg | ORAL_TABLET | Freq: Every day | ORAL | Status: DC
  Administered 2018-02-07 – 2018-02-09 (×3): 40 mg via ORAL
  Filled 2018-02-07 (×3): qty 2

## 2018-02-07 MED ORDER — BUPIVACAINE-EPINEPHRINE (PF) 0.5% -1:200000 IJ SOLN
INTRAMUSCULAR | Status: DC | PRN
  Administered 2018-02-07: 30 mL

## 2018-02-07 MED ORDER — DEXAMETHASONE SODIUM PHOSPHATE 10 MG/ML IJ SOLN
INTRAMUSCULAR | Status: DC | PRN
  Administered 2018-02-07: 10 mg via INTRAVENOUS

## 2018-02-07 MED ORDER — FENTANYL CITRATE (PF) 100 MCG/2ML IJ SOLN
INTRAMUSCULAR | Status: DC | PRN
  Administered 2018-02-07: 75 ug via INTRAVENOUS
  Administered 2018-02-07: 25 ug via INTRAVENOUS

## 2018-02-07 MED ORDER — BUPIVACAINE-EPINEPHRINE (PF) 0.5% -1:200000 IJ SOLN
INTRAMUSCULAR | Status: AC
  Filled 2018-02-07: qty 30

## 2018-02-07 MED ORDER — LORATADINE 10 MG PO TABS
10.0000 mg | ORAL_TABLET | Freq: Every day | ORAL | Status: DC
  Administered 2018-02-07 – 2018-02-09 (×3): 10 mg via ORAL
  Filled 2018-02-07 (×3): qty 1

## 2018-02-07 MED ORDER — POTASSIUM CHLORIDE 20 MEQ PO PACK
40.0000 meq | PACK | Freq: Two times a day (BID) | ORAL | Status: DC
  Administered 2018-02-07 – 2018-02-08 (×3): 40 meq via ORAL
  Filled 2018-02-07 (×5): qty 2

## 2018-02-07 MED ORDER — ALBUTEROL SULFATE (2.5 MG/3ML) 0.083% IN NEBU: 3.0000 mL | INHALATION_SOLUTION | Freq: Four times a day (QID) | RESPIRATORY_TRACT | Status: DC | PRN

## 2018-02-07 SURGICAL SUPPLY — 42 items
APPLIER CLIP ROT 10 11.4 M/L (STAPLE)
BENZOIN TINCTURE PRP APPL 2/3 (GAUZE/BANDAGES/DRESSINGS) IMPLANT
CABLE HIGH FREQUENCY MONO STRZ (ELECTRODE) ×4 IMPLANT
CHLORAPREP W/TINT 26ML (MISCELLANEOUS) ×4 IMPLANT
CLIP APPLIE ROT 10 11.4 M/L (STAPLE) IMPLANT
CLOSURE WOUND 1/2 X4 (GAUZE/BANDAGES/DRESSINGS)
COVER SURGICAL LIGHT HANDLE (MISCELLANEOUS) ×4 IMPLANT
CUTTER FLEX LINEAR 45M (STAPLE) ×4 IMPLANT
DERMABOND ADVANCED (GAUZE/BANDAGES/DRESSINGS) ×2
DERMABOND ADVANCED .7 DNX12 (GAUZE/BANDAGES/DRESSINGS) ×2 IMPLANT
DRAPE LAPAROSCOPIC ABDOMINAL (DRAPES) ×4 IMPLANT
ELECT REM PT RETURN 15FT ADLT (MISCELLANEOUS) ×4 IMPLANT
ENDOLOOP SUT PDS II  0 18 (SUTURE)
ENDOLOOP SUT PDS II 0 18 (SUTURE) IMPLANT
GLOVE BIOGEL PI IND STRL 6.5 (GLOVE) ×4 IMPLANT
GLOVE BIOGEL PI IND STRL 7.0 (GLOVE) ×4 IMPLANT
GLOVE BIOGEL PI INDICATOR 6.5 (GLOVE) ×4
GLOVE BIOGEL PI INDICATOR 7.0 (GLOVE) ×4
GLOVE SURG SIGNA 7.5 PF LTX (GLOVE) ×4 IMPLANT
GLOVE SURG SS PI 8.0 STRL IVOR (GLOVE) ×4 IMPLANT
GOWN STRL REUS W/ TWL LRG LVL3 (GOWN DISPOSABLE) ×4 IMPLANT
GOWN STRL REUS W/TWL LRG LVL3 (GOWN DISPOSABLE) ×4
GOWN STRL REUS W/TWL XL LVL3 (GOWN DISPOSABLE) ×8 IMPLANT
KIT BASIN OR (CUSTOM PROCEDURE TRAY) ×4 IMPLANT
POUCH SPECIMEN RETRIEVAL 10MM (ENDOMECHANICALS) ×4 IMPLANT
RELOAD 45 VASCULAR/THIN (ENDOMECHANICALS) IMPLANT
RELOAD STAPLE TA45 3.5 REG BLU (ENDOMECHANICALS) ×4 IMPLANT
SCISSORS LAP 5X35 DISP (ENDOMECHANICALS) ×4 IMPLANT
SET IRRIG TUBING LAPAROSCOPIC (IRRIGATION / IRRIGATOR) ×4 IMPLANT
SHEARS HARMONIC ACE PLUS 36CM (ENDOMECHANICALS) ×4 IMPLANT
SLEEVE XCEL OPT CAN 5 100 (ENDOMECHANICALS) ×4 IMPLANT
STAPLER VISISTAT 35W (STAPLE) ×4 IMPLANT
STRIP CLOSURE SKIN 1/2X4 (GAUZE/BANDAGES/DRESSINGS) IMPLANT
SUT MNCRL AB 4-0 PS2 18 (SUTURE) ×4 IMPLANT
SUT VIC AB 2-0 SH 18 (SUTURE) IMPLANT
SUT VICRYL 0 UR6 27IN ABS (SUTURE) ×4 IMPLANT
TOWEL OR 17X26 10 PK STRL BLUE (TOWEL DISPOSABLE) ×4 IMPLANT
TOWEL OR NON WOVEN STRL DISP B (DISPOSABLE) ×4 IMPLANT
TRAY LAPAROSCOPIC (CUSTOM PROCEDURE TRAY) ×4 IMPLANT
TROCAR BLADELESS OPT 5 100 (ENDOMECHANICALS) ×4 IMPLANT
TROCAR XCEL BLUNT TIP 100MML (ENDOMECHANICALS) ×4 IMPLANT
TUBING INSUF HEATED (TUBING) ×4 IMPLANT

## 2018-02-07 NOTE — Progress Notes (Signed)
CC:  Abdominal pain GI:  [place]  Subjective: Abdominal pain  Objective: Vital signs in last 24 hours: Temp:  [97.9 F (36.6 C)-98.5 F (36.9 C)] 97.9 F (36.6 C) (05/15 2148) Pulse Rate:  [53-98] 64 (05/16 0455) Resp:  [14-23] 14 (05/16 0455) BP: (101-159)/(62-80) 130/62 (05/16 0455) SpO2:  [90 %-96 %] 96 % (05/16 0455) Weight:  [80.5 kg (177 lb 7.5 oz)] 80.5 kg (177 lb 7.5 oz) (05/15 2100) Last BM Date: 02/06/18 NPO 1200 IV 400 urine BM x 3 Afebrile, VSS Labs last PM: K+ 2.7 WBC 10.0 A1C 7.9 CT:  Hiatal hernia, The appendix is thickened, measuring 14 millimeters on image 52 of series 2. There is no inflammatory change in the RIGHT LOWER QUADRANT. No abscess or bowel obstruction. There is a small amount of free pelvic fluid, increased over prior. Ventriculoperitoneal shunt terminates in the RIGHT mid abdomen.  Intake/Output from previous day: 05/15 0701 - 05/16 0700 In: 1200 [I.V.:800; IV Piggyback:400] Out: 400 [Urine:400] Intake/Output this shift: No intake/output data recorded.    Lab Results:  Recent Labs    02/06/18 1859  WBC 10.0  HGB 12.9  HCT 37.7  PLT 190    BMET Recent Labs    02/06/18 1859  NA 135  K 2.7*  CL 95*  CO2 27  GLUCOSE 115*  BUN 13  CREATININE 0.81  CALCIUM 8.9   PT/INR No results for input(s): LABPROT, INR in the last 72 hours.  Recent Labs  Lab 02/06/18 1859  AST 21  ALT 22  ALKPHOS 55  BILITOT 0.7  PROT 7.5  ALBUMIN 3.7     Lipase     Component Value Date/Time   LIPASE 42 02/06/2018 1859     Medications: . acetaminophen  1,000 mg Oral On Call to OR  . enoxaparin (LOVENOX) injection  40 mg Subcutaneous Q24H  . feeding supplement (GLUCERNA SHAKE)  237 mL Oral BID BM  . gabapentin  300 mg Oral On Call to OR  . insulin aspart  0-15 Units Subcutaneous Q4H  . lip balm  1 application Topical BID  . psyllium  1 packet Oral Daily  . saccharomyces boulardii  250 mg Oral BID   . ciprofloxacin  Stopped (02/06/18 2123)   And  . metronidazole Stopped (02/07/18 0335)  . ciprofloxacin    . gentamicin Stopped (02/07/18 0220)  . lactated ringers    . lactated ringers 100 mL/hr at 02/07/18 0600  . methocarbamol (ROBAXIN)  IV     Anti-infectives (From admission, onward)   Start     Dose/Rate Route Frequency Ordered Stop   02/07/18 0600  metroNIDAZOLE (FLAGYL) IVPB 500 mg     500 mg 100 mL/hr over 60 Minutes Intravenous On call to O.R. 02/06/18 1946 02/07/18 0700   02/07/18 0600  ciprofloxacin (CIPRO) IVPB 400 mg     400 mg 200 mL/hr over 60 Minutes Intravenous On call to O.R. 02/06/18 1946 02/08/18 0559   02/06/18 2200  metroNIDAZOLE (FLAGYL) IVPB 500 mg     500 mg 100 mL/hr over 60 Minutes Intravenous Every 8 hours 02/06/18 1944     02/06/18 2200  gentamicin (GARAMYCIN) 440 mg in dextrose 5 % 100 mL IVPB     7 mg/kg  62.3 kg (Adjusted) 111 mL/hr over 60 Minutes Intravenous Every 24 hours 02/06/18 2127     02/06/18 2030  ciprofloxacin (CIPRO) IVPB 400 mg     400 mg 200 mL/hr over 60 Minutes Intravenous Every 12  hours 02/06/18 1944        Assessment/Plan Type II diabetes Hypertension Hyperlipidemia Chronic pain/fibromyalgia Hx of TIA/CVA Osteoarthritis Multiple drug allergies IBS GERD/chronic nausea/dypepsia  Acute appendicitis VP shunt with hx of hydrocephalus  FEN:  IV fluids/NPO ID:  Cipro/Flagyl 5/15 =>> day 2  gentamycin 5/16 =>> day 1 DVT:  Lovenox =>> dose at 2215 hours last PM     LOS: 1 day    Brittany Atkins,Brittany Atkins 02/07/2018 [phone]  Signs and symptoms of appendicitis.  I discussed with the patient the indications and risks of appendiceal surgery.  The primary risks of appendiceal surgery include, but are not limited to, bleeding, infection, bowel surgery, and open surgery.  There is also the risk that the patient may have continued symptoms after surgery.  We discussed the typical post-operative recovery course. I tried to answer the patient's  questions.  Her husband, Abbe Amsterdam, has a medical appointment this AM and will be around after surgery.  Alphonsa Overall, MD, [facility] Surgery Pager: [phone] Office phone:  [phone]

## 2018-02-07 NOTE — Anesthesia Procedure Notes (Signed)
Procedure Name: Intubation Date/Time: 02/07/2018 10:17 AM Performed by: Lind Covert, CRNA Pre-anesthesia Checklist: Patient identified, Emergency Drugs available, Suction available, Patient being monitored and Timeout performed Patient Re-evaluated:Patient Re-evaluated prior to induction Oxygen Delivery Method: Circle system utilized Preoxygenation: Pre-oxygenation with 100% oxygen Induction Type: IV induction Laryngoscope Size: Mac and 3 Grade View: Grade I Tube type: Oral Tube size: 7.0 mm Number of attempts: 1 Airway Equipment and Method: Stylet Placement Confirmation: ETT inserted through vocal cords under direct vision,  positive ETCO2 and breath sounds checked- equal and bilateral Secured at: 22 cm Tube secured with: Tape Dental Injury: Teeth and Oropharynx as per pre-operative assessment

## 2018-02-07 NOTE — Anesthesia Preprocedure Evaluation (Signed)
Anesthesia Evaluation  Patient identified by MRN, date of birth, ID band Patient awake    Reviewed: Allergy & Precautions, NPO status , Patient's Chart, lab work & pertinent test results  Airway Mallampati: II  TM Distance: >3 FB Neck ROM: Full    Dental  (+) Teeth Intact   Pulmonary    breath sounds clear to auscultation       Cardiovascular hypertension,  Rhythm:Regular Rate:Normal     Neuro/Psych    GI/Hepatic   Endo/Other  diabetes  Renal/GU      Musculoskeletal   Abdominal   Peds  Hematology   Anesthesia Other Findings   Reproductive/Obstetrics                             Anesthesia Physical Anesthesia Plan  ASA: III and emergent  Anesthesia Plan: General   Post-op Pain Management:    Induction: Intravenous, Rapid sequence and Cricoid pressure planned  PONV Risk Score and Plan: Ondansetron and Dexamethasone  Airway Management Planned: Oral ETT  Additional Equipment:   Intra-op Plan:   Post-operative Plan: Extubation in OR  Informed Consent: I have reviewed the patients History and Physical, chart, labs and discussed the procedure including the risks, benefits and alternatives for the proposed anesthesia with the patient or authorized representative who has indicated his/her understanding and acceptance.   Dental advisory given  Plan Discussed with: CRNA and Anesthesiologist  Anesthesia Plan Comments:         Anesthesia Quick Evaluation

## 2018-02-07 NOTE — Transfer of Care (Signed)
Immediate Anesthesia Transfer of Care Note  Patient: Brittany Atkins  Procedure(s) Performed: LAPAROSCOPIC ABDOMINAL EXPLORATION (N/A Abdomen)  Patient Location: PACU  Anesthesia Type:General  Level of Consciousness: awake, alert  and oriented  Airway & Oxygen Therapy: Patient Spontanous Breathing and Patient connected to face mask oxygen  Post-op Assessment: Report given to RN and Post -op Vital signs reviewed and stable  Post vital signs: Reviewed and stable  Last Vitals:  Vitals Value Taken Time  BP 148/71 02/07/2018 11:51 AM  Temp    Pulse 63 02/07/2018 11:56 AM  Resp 14 02/07/2018 11:56 AM  SpO2 97 % 02/07/2018 11:56 AM  Vitals shown include unvalidated device data.  Last Pain:  Vitals:   02/07/18 0911  TempSrc:   PainSc: 0-No pain         Complications: No apparent anesthesia complications

## 2018-02-07 NOTE — Anesthesia Postprocedure Evaluation (Signed)
Anesthesia Post Note  Patient: Brittany Atkins  Procedure(s) Performed: LAPAROSCOPIC ABDOMINAL EXPLORATION (N/A Abdomen)     Patient location during evaluation: PACU Anesthesia Type: General Level of consciousness: awake and alert Pain management: pain level controlled Vital Signs Assessment: post-procedure vital signs reviewed and stable Respiratory status: spontaneous breathing, nonlabored ventilation, respiratory function stable and patient connected to nasal cannula oxygen Cardiovascular status: blood pressure returned to baseline and stable Postop Assessment: no apparent nausea or vomiting Anesthetic complications: no    Last Vitals:  Vitals:   02/07/18 1353 02/07/18 1501  BP: (!) 117/59 111/71  Pulse: (!) 57 (!) 56  Resp: 16 18  Temp: 36.6 C 36.4 C  SpO2: 95% 96%    Last Pain:  Vitals:   02/07/18 1501  TempSrc: Oral  PainSc:                  Rashae Rother COKER

## 2018-02-07 NOTE — Plan of Care (Signed)
  Problem: Education: Goal: Knowledge of General Education information will improve Outcome: Progressing   Problem: Health Behavior/Discharge Planning: Goal: Ability to manage health-related needs will improve Outcome: Progressing   Problem: Clinical Measurements: Goal: Ability to maintain clinical measurements within normal limits will improve Outcome: Progressing Goal: Will remain free from infection Outcome: Progressing Goal: Diagnostic test results will improve Outcome: Progressing Goal: Respiratory complications will improve Outcome: Progressing Goal: Cardiovascular complication will be avoided Outcome: Progressing   Problem: Activity: Goal: Risk for activity intolerance will decrease Outcome: Progressing   Problem: Elimination: Goal: Will not experience complications related to bowel motility Outcome: Progressing Goal: Will not experience complications related to urinary retention Outcome: Progressing   Problem: Pain Managment: Goal: General experience of comfort will improve Outcome: Progressing   Problem: Safety: Goal: Ability to remain free from injury will improve Outcome: Progressing   Problem: Skin Integrity: Goal: Risk for impaired skin integrity will decrease Outcome: Progressing   Problem: Clinical Measurements: Goal: Postoperative complications will be avoided or minimized Outcome: Progressing   Problem: Skin Integrity: Goal: Demonstration of wound healing without infection will improve Outcome: Progressing

## 2018-02-07 NOTE — Progress Notes (Signed)
CRITICAL VALUE ALERT  Critical Value:  Potassium 2.5   Date & Time Notied:  02/07/2018 @1240   Provider Notified: Dr Lucia Gaskins   Orders Received/Actions taken: orders to change IVF and runs of potassium x3.

## 2018-02-07 NOTE — Op Note (Addendum)
Re:   [name] DOB:   08-19-1936 MRN:   [id number]                   FACILITY:  [facility]  DATE OF PROCEDURE: 02/07/2018                              OPERATIVE REPORT  PREOPERATIVE DIAGNOSIS:  Appendicitis  POSTOPERATIVE DIAGNOSIS:  Probable prior appendectomy, adhesions of small bowel in pelvis  PROCEDURE:  Laparoscopic exploration, removal of stump of appendix (maybe) [photos taken and in Epic]  SURGEON:  Fenton Malling. Lucia Gaskins, MD  ASSISTANT:  Modena Jansky, PA  ANESTHESIA:  General endotracheal.  Anesthesiologist: Roberts Gaudy, MD CRNA: Lind Covert, CRNA; [place] Manus Rudd, CRNA  ASA:  3E  ESTIMATED BLOOD LOSS:  Minimal.  DRAINS: none   SPECIMEN:   Appendix  COUNTS CORRECT:  YES  INDICATIONS FOR PROCEDURE: Brittany Atkins is a 82 y.o. (DOB: Jun 24, 1936) white female whose primary care doctor is Brittany Pou, MD and comes to the OR for an appendectomy.   I discussed with the patient, the indications and potential complications of appendiceal surgery.  The potential complications include, but are not limited to, bleeding, open surgery, bowel resection, and the possibility of another diagnosis.  OPERATIVE NOTE:  The patient underwent a general endotracheal anesthetic as supervised by Anesthesiologist: Roberts Gaudy, MD CRNA: Lind Covert, CRNA; [place] Manus Rudd, CRNA, General, in [place] room #4.  The patient was given Cipro and Flagyl prior to the beginning of the procedure and the abdomen was prepped with ChloraPrep.   A time-out was held and surgical checklist run.  An infraumbilical incision was made with sharp dissection carried down to the abdominal cavity.  An 12 mm Hasson trocar was inserted through the infraumbilical incision and into the peritoneal cavity.  A 30 degree 5 mm laparoscope was inserted through a 5 mm Hasson trocar and the Hasson trocar secured with a 0 Vicryl suture.  I placed a 5 mm trocar in the right upper quadrant and a 5 mm torcar  in left lower quadrant and did abdominal exploration.    The right and left lobes of liver unremarkable.  Stomach was unremarkable.  She has loops of small bowel stuck on both sides of the pelvis.  I saw no other intra-abdominal abnormality.  The appendix appeared absent.  I traced the tinea on the colon down to where the appendix should be, but only found a fat mound.  I did elevate this mound and treated it like the appendix.  I got down on the cecum and  then used a blue load 45 mm Ethicon Endo-GIA stapler and fired this across the base of this mound.  I placed the mass/stump in EndoCatch bag and delivered the bag through the umbilical incision.  I irrigated the abdomen with 300 cc of saline.  I spent 20 minutes dissecting out around the cecum and mobilizing the cecum.  But I found no inflammation or abnormal anatomy for an alternative location for an appendix.  After irrigating the abdomen, I then removed the trocars, in turn.  The umbilical port fascia was closed with 0 Vicryl suture.   I closed the skin each site with a 4-0 Monocryl suture and painted the wounds with DermaBond.  I then injected a total of 30 mL of 0.25% Marcaine at the incisions.  Sponge and needle count were correct at the end of  the case.  The patient was transferred to the recovery room in good condition.  The patient tolerated the procedure well and it depends on the patient's post op clinical course as to when the patient could be discharged.   Left upper - stapled stump of appendix Right upper- ileal anti-mesenteric fat in clamp Left lower - small bowel adhesions on both sides of pelvis  Alphonsa Overall, MD, [facility] Pager: [phone] Office phone:  [phone]

## 2018-02-08 LAB — BASIC METABOLIC PANEL
Anion gap: 10 (ref 5–15)
BUN: 10 mg/dL (ref 6–20)
CO2: 23 mmol/L (ref 22–32)
CREATININE: 0.73 mg/dL (ref 0.44–1.00)
Calcium: 8.3 mg/dL — ABNORMAL LOW (ref 8.9–10.3)
Chloride: 96 mmol/L — ABNORMAL LOW (ref 101–111)
GFR calc Af Amer: >60 mL/min (ref >60)
GFR calc non Af Amer: >60 mL/min (ref >60)
Glucose, Bld: 140 mg/dL — ABNORMAL HIGH (ref 65–99)
Potassium: 4.3 mmol/L (ref 3.5–5.1)
Sodium: 129 mmol/L — ABNORMAL LOW (ref 135–145)

## 2018-02-08 LAB — GLUCOSE, CAPILLARY
GLUCOSE-CAPILLARY: 181 mg/dL — AB (ref 65–99)
GLUCOSE-CAPILLARY: 181 mg/dL — AB (ref 65–99)
Glucose-Capillary: 139 mg/dL — ABNORMAL HIGH (ref 65–99)
Glucose-Capillary: 138 mg/dL — ABNORMAL HIGH (ref 65–99)
Glucose-Capillary: 118 mg/dL — ABNORMAL HIGH (ref 65–99)
Glucose-Capillary: 175 mg/dL — ABNORMAL HIGH (ref 65–99)

## 2018-02-08 MED ORDER — TRAMADOL HCL 50 MG PO TABS: 50.0000 mg | ORAL_TABLET | Freq: Four times a day (QID) | ORAL | Status: DC | PRN

## 2018-02-08 MED ORDER — ALUM & MAG HYDROXIDE-SIMETH 200-200-20 MG/5ML PO SUSP: 30.0000 mL | Freq: Four times a day (QID) | ORAL | Status: DC | PRN

## 2018-02-08 NOTE — Plan of Care (Signed)
Plan of care discussed with patient 

## 2018-02-08 NOTE — Discharge Instructions (Signed)
[facility] ______[facility] °LAPAROSCOPIC SURGERY: POST OP INSTRUCTIONS °Always review your discharge instruction sheet given to you by the facility where your surgery was performed. °IF YOU HAVE DISABILITY OR FAMILY LEAVE FORMS, YOU MUST BRING THEM TO THE OFFICE FOR PROCESSING.   °DO NOT GIVE THEM TO YOUR DOCTOR. ° °1. A prescription for pain medication may be given to you upon discharge.  Take your pain medication as prescribed, if needed.  If narcotic pain medicine is not needed, then you may take acetaminophen (Tylenol) or ibuprofen (Advil) as needed. °2. Take your usually prescribed medications unless otherwise directed. °3. If you need a refill on your pain medication, please contact your pharmacy.  They will contact our office to request authorization. Prescriptions will not be filled after 5pm or on week-ends. °4. You should follow a light diet the first few days after arrival home, such as soup and crackers, etc.  Be sure to include lots of fluids daily. °5. Most patients will experience some swelling and bruising in the area of the incisions.  Ice packs will help.  Swelling and bruising can take several days to resolve.  °6. It is common to experience some constipation if taking pain medication after surgery.  Increasing fluid intake and taking a stool softener (such as Colace) will usually help or prevent this problem from occurring.  A mild laxative (Milk of Magnesia or Miralax) should be taken according to package instructions if there are no bowel movements after 48 hours. °7. Unless discharge instructions indicate otherwise, you may remove your bandages 24-48 hours after surgery, and you may shower at that time.  You may have steri-strips (small skin tapes) in place directly over the incision.  These strips should be left on the skin for 7-10 days.  If your surgeon used skin glue on the incision, you may shower in 24 hours.  The glue will flake off over the next 2-3 weeks.  Any sutures or  staples will be removed at the office during your follow-up visit. °8. ACTIVITIES:  You may resume regular (light) daily activities beginning the next day--such as daily self-care, walking, climbing stairs--gradually increasing activities as tolerated.  You may have sexual intercourse when it is comfortable.  Refrain from any heavy lifting or straining until approved by your doctor. °a. You may drive when you are no longer taking prescription pain medication, you can comfortably wear a seatbelt, and you can safely maneuver your car and apply brakes. °b. RETURN TO WORK:  __________________________________________________________ °9. You should see your doctor in the office for a follow-up appointment approximately 2-3 weeks after your surgery.  Make sure that you call for this appointment within a day or two after you arrive home to insure a convenient appointment time. °10. OTHER INSTRUCTIONS: __________________________________________________________________________________________________________________________ __________________________________________________________________________________________________________________________ °WHEN TO CALL YOUR DOCTOR: °1. Fever over 101.0 °2. Inability to urinate °3. Continued bleeding from incision. °4. Increased pain, redness, or drainage from the incision. °5. Increasing abdominal pain ° °The clinic staff is available to answer your questions during regular business hours.  Please don’t hesitate to call and ask to speak to one of the nurses for clinical concerns.  If you have a medical emergency, go to the nearest emergency room or call 911.  A surgeon from [facility] is always on call at the hospital. °[address], [address], [place], [place]  [address] ? [address] [address], [place], [place]   [address] °[phone] ? [phone] ? FAX [phone] °Web site:   [email or web address] °

## 2018-02-08 NOTE — Progress Notes (Addendum)
1 Day Post-Op    CC: Right lower quadrant abdominal pain  Subjective: Still complaining of pain in the right lower quadrant but says it somewhat different than before.  She is tolerating liquids well and not taking anymore because she thinks it will make her feel better.  She has been up walking. She is taking plain Tylenol and the Tylenol with hydrocodone without ill effect.  She reports she cannot take ibuprofen, because of prior GI bleeds/H pylori. Port sites all look good.  A little bit of bruising at the main umbilical port site. She is still on oxygen, she does have sleep apnea.  We will wean her O2 this a.m. and see how she does.  Objective: Vital signs in last 24 hours: Temp:  [97.4 F (36.3 C)-97.9 F (36.6 C)] 97.5 F (36.4 C) (05/17 0509) Pulse Rate:  [50-67] 65 (05/17 0625) Resp:  [12-18] 18 (05/17 0509) BP: (103-148)/(38-73) 122/65 (05/17 0625) SpO2:  [91 %-98 %] 96 % (05/17 0509) Weight:  [80.5 kg (177 lb 7.5 oz)] 80.5 kg (177 lb 7.5 oz) (05/16 0911) Last BM Date: 02/07/18 960 p.o. 2800 IV 1700 urine BM x1 Afebrile vital signs are stable Labs show sodium of 129, potassium is up to 4.3, chloride 96, CO2 23, glucose 140.  Creatinine 0.73 BUN is 10 Intake/Output from previous day: 05/16 0701 - 05/17 0700 In: 3660 [P.O.:960; I.V.:1100; IV Piggyback:1600] Out: 1701 [Urine:1700; Stool:1] Intake/Output this shift: Total I/O In: -  Out: 500 [Urine:500]  General appearance: alert, cooperative and no distress Resp: clear to auscultation bilaterally GI: Soft, sore, she has been up ambulating, complains of some pain right lower quadrant and port sites.  Tolerating fluids well.  Lab Results:  Recent Labs    02/06/18 1859  WBC 10.0  HGB 12.9  HCT 37.7  PLT 190    BMET Recent Labs    02/07/18 1159 02/08/18 0458  NA 133* 129*  K 2.5* 4.3  CL 95* 96*  CO2 25 23  GLUCOSE 168* 140*  BUN 10 10  CREATININE 0.85 0.73  CALCIUM 8.0* 8.3*   PT/INR No results  for input(s): LABPROT, INR in the last 72 hours.  Recent Labs  Lab 02/06/18 1859  AST 21  ALT 22  ALKPHOS 55  BILITOT 0.7  PROT 7.5  ALBUMIN 3.7     Lipase     Component Value Date/Time   LIPASE 42 02/06/2018 1859     Medications: . enoxaparin (LOVENOX) injection  40 mg Subcutaneous Q24H  . famotidine  40 mg Oral Daily  . feeding supplement (GLUCERNA SHAKE)  237 mL Oral BID BM  . gabapentin  600 mg Oral TID  . hydrochlorothiazide  25 mg Oral Daily  . insulin aspart  0-15 Units Subcutaneous Q4H  . lip balm  1 application Topical BID  . loratadine  10 mg Oral Daily  . losartan  75 mg Oral Daily  . Melatonin  1 tablet Oral QHS  . metFORMIN  500 mg Oral BID WC  . potassium chloride  40 mEq Oral BID  . psyllium  1 packet Oral Daily  . saccharomyces boulardii  250 mg Oral BID    Assessment/Plan  Type II diabetes Hypertension Hyperlipidemia Chronic pain/fibromyalgia Hx of TIA/CVA Osteoarthritis Multiple drug allergies IBS GERD/chronic nausea/dypepsia Sleep apnea -she does not wear CPAP -secondary pain and causes her VP shunt  Acute appendicitis? Probable prior appendectomy, adhesions of small bowel in pelvis Laparoscopic exploration, removal of stump of appendix, 02/07/2018,  Dr. Alphonsa Overall   POD 1 VP shunt with hx of hydrocephalus  FEN:  IV fluids/NPO ID:  Cipro/Flagyl 5/15 =>> day 2  gentamycin 5/16 =>> day 2 - continue abx here but not at home.  DVT:  Lovenox =>>  Follow up:  Dr. Lucia Gaskins She also needs to call her Neurosurgeon and let him review.  Know  That she has just had surgery for possible appendix.  Plan: Told patient she have to be able to eat drink walk and void before discharge.  I would like to get her up to regular food make sure she tolerates this well before discharge.    She is already on a majority of her preadmission oral medications.  I will asked the diabetes coordinator to see her and help with her glucoses while she is transitioning up to  her regular carb modified diet.    Pathology not back on surgery yesterday.    I have personally reviewed the patients medication history on the [place] controlled substance database.    LOS: 2 days   JENNINGS,WILLARD 02/08/2018 [phone]  Agree with above. She wants to go home later today.  She needs to ambulate more. Still sore in the RLQ.  Alphonsa Overall, MD, [facility] Surgery Pager: [phone] Office phone:  [phone]

## 2018-02-08 NOTE — Progress Notes (Signed)
Inpatient Diabetes Program Recommendations  AACE/ADA: New Consensus Statement on Inpatient Glycemic Control (2015)  Target Ranges:  Prepandial:   less than 140 mg/dL      Peak postprandial:   less than 180 mg/dL (1-2 hours)      Critically ill patients:  140 - 180 mg/dL   Lab Results  Component Value Date   GLUCAP 181 (H) 02/08/2018   HGBA1C 7.9 (H) 02/06/2018    Review of Glycemic Control  Diabetes history: DM2 Outpatient Diabetes medications: metformin 500 mg bid Current orders for Inpatient glycemic control: metformin 500 mg bid, Novolog 0-15 units Q4H  Blood sugars trending well. On FL diet. HgbA1C - 7.9% - acceptable with age.  Inpatient Diabetes Program Recommendations:     When tolerating CHO mod diet, change Novolog to 0-15 units tidwc and hs.  Will speak with pt this afternoon.  Thank you. Lorenda Peck, RD, LDN, CDE Inpatient Diabetes Coordinator [phone]

## 2018-02-09 LAB — GLUCOSE, CAPILLARY
GLUCOSE-CAPILLARY: 91 mg/dL (ref 65–99)
Glucose-Capillary: 82 mg/dL (ref 65–99)

## 2018-02-09 MED ORDER — HYDROCODONE-ACETAMINOPHEN 5-325 MG PO TABS: 1.0000 | ORAL_TABLET | Freq: Four times a day (QID) | ORAL | 0 refills | Status: DC | PRN

## 2018-02-09 MED ORDER — IBUPROFEN 800 MG PO TABS: 800.0000 mg | ORAL_TABLET | Freq: Three times a day (TID) | ORAL | 0 refills | Status: DC | PRN

## 2018-02-09 NOTE — Progress Notes (Signed)
2 Days Post-Op   Subjective/Chief Complaint: Pt sore but feeling better  Ambulating without difficulty  Bowels moving and tolerating diet    Objective: Vital signs in last 24 hours: Temp:  [97.5 F (36.4 C)-98.9 F (37.2 C)] 98.9 F (37.2 C) (05/17 1945) Pulse Rate:  [59-69] 65 (05/17 1945) Resp:  [16] 16 (05/17 1945) BP: (103-126)/(57-70) 126/70 (05/17 1945) SpO2:  [94 %-96 %] 94 % (05/17 1945) Last BM Date: 02/07/18  Intake/Output from previous day: 05/17 0701 - 05/18 0700 In: 2640 [P.O.:640; I.V.:1900; IV Piggyback:100] Out: 2550 [Urine:2550] Intake/Output this shift: No intake/output data recorded.  Incision/Wound:CDI soft ND   Lab Results:  Recent Labs    02/06/18 1859  WBC 10.0  HGB 12.9  HCT 37.7  PLT 190   BMET Recent Labs    02/07/18 1159 02/08/18 0458  NA 133* 129*  K 2.5* 4.3  CL 95* 96*  CO2 25 23  GLUCOSE 168* 140*  BUN 10 10  CREATININE 0.85 0.73  CALCIUM 8.0* 8.3*   PT/INR No results for input(s): LABPROT, INR in the last 72 hours. ABG No results for input(s): PHART, HCO3 in the last 72 hours.  Invalid input(s): PCO2, PO2  Studies/Results: No results found.  Anti-infectives: Anti-infectives (From admission, onward)   Start     Dose/Rate Route Frequency Ordered Stop   02/07/18 0600  metroNIDAZOLE (FLAGYL) IVPB 500 mg     500 mg 100 mL/hr over 60 Minutes Intravenous On call to O.R. 02/06/18 1946 02/07/18 0700   02/07/18 0600  ciprofloxacin (CIPRO) IVPB 400 mg  Status:  Discontinued     400 mg 200 mL/hr over 60 Minutes Intravenous On call to O.R. 02/06/18 1946 02/07/18 1346   02/06/18 2200  metroNIDAZOLE (FLAGYL) IVPB 500 mg     500 mg 100 mL/hr over 60 Minutes Intravenous Every 8 hours 02/06/18 1944     02/06/18 2200  gentamicin (GARAMYCIN) 440 mg in dextrose 5 % 100 mL IVPB  Status:  Discontinued     7 mg/kg  62.3 kg (Adjusted) 111 mL/hr over 60 Minutes Intravenous Every 24 hours 02/06/18 2127 02/07/18 1406   02/06/18 2030   ciprofloxacin (CIPRO) IVPB 400 mg     400 mg 200 mL/hr over 60 Minutes Intravenous Every 12 hours 02/06/18 1944        Assessment/Plan: s/p Procedure(s) with comments: LAPAROSCOPIC ABDOMINAL EXPLORATION (N/A) - possible laparoscopic appendectomy, need pathology report  [place]    LOS: 3 days    Brittany Atkins 02/09/2018

## 2018-02-09 NOTE — Discharge Summary (Signed)
Physician Discharge Summary  Patient ID: Brittany Atkins MRN: [id number] DOB/AGE: September 17, 1936 82 y.o.  Admit date: 02/06/2018 Discharge date: 02/09/2018  Admission Diagnoses:  Discharge Diagnoses:  Principal Problem:   Acute appendicitis Active Problems:   Communicating hydrocephalus   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   Diabetes mellitus type 2 in obese [facility])   Obstructive sleep apnea   Discharged Condition: good  Hospital Course: Pt admitted on 5/15 /19  With diagnosis of acute appendicitis.  She underwent laparoscopy and was found  to have appendiceal stump inflammation and this was removed by Dr Lucia Gaskins.  She had some post op soreness but began to ambulate and diet was advanced.  Her pain control improved and her bowels moved.  She was discharged home on POD 2 in good condition.       Treatments: surgery: laparoscopy   Discharge Exam: Blood pressure 126/70, pulse 65, temperature 98.9 F (37.2 C), resp. rate 16, height 5\' 2"  (1.575 m), weight 80.5 kg (177 lb 7.5 oz), SpO2 94 %. General appearance: alert and cooperative Resp: clear to auscultation bilaterally Cardio: regular rate and rhythm, S1, S2 normal, no murmur, click, rub or gallop Incision/Wound:CDI ND soft sore some mild bruising around the umbilicus   Disposition: Discharge disposition: 01-Home or Self Care       Discharge Instructions    Diet - low sodium heart healthy   Complete by:  As directed    Increase activity slowly   Complete by:  As directed       Follow-up Information    Alphonsa Overall, MD Follow up on 02/28/2018.   Specialty:  General Surgery Why:  your appointment is at 4:15 PM.  Be at the office 30 minutes early for check-in.  Bring photo ID and insurance information. Call your neurosurgeon and let him follow-up and not you have had this surgery. Contact information: [address] [place] [address] [phone]        Angelica Pou, MD  Follow up.   Specialty:  Internal Medicine Why:  Call and let her know you have had the surgery.  Follow-up for medical issues. Contact information: [place] [address] [phone]           Signed: Joyice Faster Cadience Bradfield 02/09/2018, 7:41 AM

## 2018-03-15 ENCOUNTER — Encounter: Payer: Self-pay | Admitting: Surgery

## 2018-07-04 ENCOUNTER — Ambulatory Visit (INDEPENDENT_AMBULATORY_CARE_PROVIDER_SITE_OTHER): Payer: Medicare (Managed Care) | Admitting: Neurology

## 2018-07-04 ENCOUNTER — Encounter

## 2018-07-04 ENCOUNTER — Encounter: Payer: Self-pay | Admitting: Neurology

## 2018-07-04 VITALS — BP 125/75 | HR 65 | Ht 62.0 in | Wt 173.0 lb

## 2018-07-04 DIAGNOSIS — R258 Other abnormal involuntary movements: Secondary | ICD-10-CM | POA: Diagnosis not present

## 2018-07-04 DIAGNOSIS — R251 Tremor, unspecified: Secondary | ICD-10-CM

## 2018-07-04 DIAGNOSIS — G250 Essential tremor: Secondary | ICD-10-CM | POA: Diagnosis not present

## 2018-07-04 NOTE — Progress Notes (Signed)
Subjective:    Patient ID: Brittany Atkins is a 82 y.o. female.  HPI    History:  Dear Dr. Jimmye Norman,   I saw your patient, Brittany Atkins, upon your kind request, in my neurologic clinic today for evaluation of her tremors and history of abnormal involuntary movements. I reviewed records that the patient brought herself. Thank you also for your phone call. As you know, Brittany Atkins is an 82 year old right-handed woman with an underlying complex medical history of arthritis, diabetes, hypertension, hyperlipidemia, NPH with status post VP shunt placement in 2011, low back pain with status post back surgery 10 years ago, who reports intermittent tremors affecting her hands and her head and neck and lip areas. She reports a family history of tremor affecting her mother and sister. She also had a recent problem with involuntary movements affecting her tongue and mouth, in keeping with tardive dyskinesia, which was deemed secondary to Compazine. She was given Compazine for headache management in the recent past. After the Compazine was discontinued her involuntary movements improved. She has been started on primidone low dose, half a pill at that time and was told to increase it to a whole pill, she has not started that yet. With primidone, she feels an improvement of the tremor. She uses a rolling walker. She tries to hydrate well with water but also likes to have a steep, up to 3 cups per day on average.  I evaluated her 2 years ago for gait disorder and her memory. I did not suggest any new medications at the time.  For her VPS, she has been followed by neurosurgery through [facility].  Previously:   07/24/2016: (She) has difficulty walking and memory loss. She was diagnosed with normal pressure hydrocephalus several years prior to her shunt placement, then moved to [place] and moved back to [place] about 3 years ago. [facility], goes twice weekly, Mondays and Wednesdays for  about 4 hours each time and has physical therapy and occupational therapy there.  She had a head CT without contrast on 03/28/2011 which I reviewed: IMPRESSION: No acute intracranial abnormality. Atrophy, chronic microvascular disease.  She was diagnosed with  TIA in May 2017. She was at [facility]. Records are not available for my review today. Symptoms  Included bilateral leg weakness and slurring of speech as I understand. She did not have one-sided weakness or numbness or facial droop. She was seen by her Neurosurgeon at the time and shunt was adjusted per daughter. She has not seen her neurosurgeon since then, but there may be an insurance change as well, requiring her to change to another NSG practice.  She has had low back pain. She had seen orthopedics for right hip pain before and received an injection into the right hip. She was told that she may need a referral for low back pain to another specialist and that she may benefit from an ESI.  She has been using a rolling walker.  Thankfully, she has not fallen  Recently but has come close to several times. Memory wise, she has trouble focusing and has short-term memory problems as well as trouble with complex tasks such as cooking. She does not always eat well. She is not sure if she drinks enough. She feels like she has something to drink all the time. During her hospitalization in May she was told she had a urinary tract infection and had severe constipation with impaction. She lives with her husband,  who has Parkinson's disease, and a single story home. One daughter and one son live in [place], her youngest daughter lives locally in [place]. Of note, she was diagnosed with obstructive sleep apnea and placed on CPAP therapy, which she could not tolerate as the headgear bothered her shunt tubing and caused pain. Per daughter, sleep apnea was mild.   Her Past Medical History Is Significant For: Past Medical History:  Diagnosis Date  .  Allergy    seasonal  . Anxiety   . Asthma with allergic rhinitis   . Cataract    surgery  . Cervical radiculopathy   . Chronic kidney disease    ladder is not filling up/ usually incontinent  . Chronic pain   . Diabetes mellitus without complication ([place])   . Fibromyalgia   . GERD (gastroesophageal reflux disease)   . H/O TIA (transient ischemic attack) and stroke   . Headache   . HLD (hyperlipidemia)   . Hypertension   . IBS (irritable bowel syndrome)   . Insomnia   . OSA (obstructive sleep apnea)   . Osteoarthritis   . PAD (peripheral artery disease) ([place])   . Positive PPD   . Sleep apnea    doesn't use her CPAP  . Stroke [facility])    TIA's; MAy 2017  . Thyroid disease   . 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 brain shunt     hydrocephalis  . CATARACT EXTRACTION, BILATERAL  2014  . HIP SURGERY  2009   hip muscle  . HYDROCELE EXCISION / REPAIR  07/2010  . LAPAROSCOPIC ABDOMINAL EXPLORATION N/A 02/07/2018   Procedure: LAPAROSCOPIC ABDOMINAL EXPLORATION;  Surgeon: Alphonsa Overall, MD;  Location: [facility] OR[facility];  Service: General;  Laterality: N/A;  possible laparoscopic appendectomy, need pathology report  . LUMBAR LAMINECTOMY  2009  . OVARIAN CYST REMOVAL     with  fallopian tube  . TOTAL ABDOMINAL HYSTERECTOMY      Her Family History Is Significant For: Family History  Problem Relation Age of Onset  . Hypertension Mother   . Hyperlipidemia Mother   . Heart disease Mother   . Breast cancer Mother   . Diabetes Father   . Hypertension Father   . Stroke Father   . Prostate cancer Father   . Diabetes Sister   . Diabetes Brother        x 2  . Heart attack Maternal Grandmother   . Heart attack Maternal Grandfather   . Thyroid cancer Paternal Grandmother   . Hyperthyroidism Maternal Aunt        x 2    Her Social History Is Significant  For: Social History   Socioeconomic History  . Marital status: Married    Spouse name: Not on file  . Number of children: 3  . Years of education: college  . Highest education level: Not on file  Occupational History  . Occupation: retired  Scientific laboratory technician  . Financial resource strain: Not on file  . Food insecurity:    Worry: Not on file    Inability: Not on file  . Transportation needs:    Medical: Not on file    Non-medical: Not on file  Tobacco Use  . Smoking status: Never Smoker  . Smokeless tobacco: Never Used  Substance and Sexual Activity  . Alcohol use: No  .  Drug use: No  . Sexual activity: Not on file  Lifestyle  . Physical activity:    Days per week: Not on file    Minutes per session: Not on file  . Stress: Not on file  Relationships  . Social connections:    Talks on phone: Not on file    Gets together: Not on file    Attends religious service: Not on file    Active member of club or organization: Not on file    Attends meetings of clubs or organizations: Not on file    Relationship status: Not on file  Other Topics Concern  . Not on file  Social History Narrative   Drinks 1 cup of coffee a day, also drinks green tea     Her Allergies Are:  Allergies  Allergen Reactions  . Cefdinir   . Clindamycin/Lincomycin   . Codeine Nausea Only  . Cymbalta [Duloxetine Hcl]   . Sulfamethizole   . Tuberculin Tests     Hx of positive PPd  . Oxycodone Rash  :   Her Current Medications Are:  Outpatient Encounter Medications as of 07/04/2018  Medication Sig  . ACYCLOVIR PO Take 1 tablet by mouth daily as needed (fever blisters).  Marland Kitchen albuterol (PROAIR HFA) 108 (90 Base) MCG/ACT inhaler Inhale 2 puffs into the lungs every 6 (six) hours as needed for wheezing or shortness of breath.   Marland Kitchen aspirin 81 MG tablet Take 81 mg by mouth daily.  Marland Kitchen atorvastatin (LIPITOR) 40 MG tablet Take 40 mg by mouth daily.  Marland Kitchen azelastine (OPTIVAR) 0.05 % ophthalmic solution Place 1 drop  into both eyes 2 (two) times daily.   Marland Kitchen dicyclomine (BENTYL) 10 MG capsule Take 1-2 capsules (10-20 mg total) by mouth every 8 (eight) hours as needed for spasms.  . famotidine (PEPCID) 40 MG tablet Take 40 mg by mouth daily.  . fexofenadine (ALLEGRA) 30 MG tablet Take 30 mg by mouth 2 (two) times daily.  . fluticasone (FLONASE) 50 MCG/ACT nasal spray Place into both nostrils daily.  Marland Kitchen gabapentin (NEURONTIN) 600 MG tablet Take 600 mg by mouth 3 (three) times daily.   . hydrochlorothiazide (HYDRODIURIL) 25 MG tablet Take 25 mg by mouth daily.  . Lactobacillus (ACIDOPHILUS) 100 MG CAPS Take by mouth.  . loratadine (CLARITIN) 10 MG tablet Take 10 mg by mouth daily.  Marland Kitchen losartan (COZAAR) 50 MG tablet Take 75 mg by mouth daily.  . Melatonin 5 MG TABS Take 1 tablet by mouth at bedtime.  . metFORMIN (GLUCOPHAGE) 500 MG tablet Take by mouth 2 (two) times daily with a meal.  . ondansetron (ZOFRAN ODT) 4 MG disintegrating tablet Take 1 tablet (4 mg total) by mouth every 6 (six) hours as needed for nausea or vomiting.  . primidone (MYSOLINE) 50 MG tablet Take 50 mg by mouth at bedtime.  . Probiotic Product (RISAQUAD-2 PO) Take 2 capsules by mouth daily.  . traMADol (ULTRAM) 50 MG tablet Take 50 mg by mouth 3 (three) times daily as needed.  . [DISCONTINUED] HYDROcodone-acetaminophen (NORCO/VICODIN) 5-325 MG tablet Take 1 tablet by mouth every 6 (six) hours as needed for moderate pain.  . [DISCONTINUED] ibuprofen (ADVIL,MOTRIN) 800 MG tablet Take 1 tablet (800 mg total) by mouth every 8 (eight) hours as needed.   No facility-administered encounter medications on file as of 07/04/2018.   :  Review of Systems:  Out of a complete 14 point review of systems, all are reviewed and negative with the exception of  these symptoms as listed below: Review of Systems  Neurological:       Pt presents today to discuss her tremors. Pt notices the tremors in her mouth and hands but reports that they can be all over her  body. Pt is right handed.    Objective:  Neurological Exam  Physical Exam Physical Examination:   Vitals:   07/04/18 1304  BP: 125/75  Pulse: 65    General Examination: The patient is a very pleasant 82 y.o. female in no acute distress. She appears well-developed and well-nourished and well groomed.   HEENT: Normocephalic, atraumatic, pupils are equal, round and reactive to light and accommodation. Extraocular tracking is good without limitation to gaze excursion or nystagmus noted. Hearing is grossly intact. Face is symmetric with normal facial animation and normal facial sensation. Speech is clear with no dysarthria noted. There is no hypophonia. Neck is supple with full range of passive and active motion. Oropharynx exam reveals: mild mouth dryness, adequate dental hygiene and mild airway crowding, due to redundant soft palate. Mallampati is class II. Tongue protrudes centrally and palate elevates symmetrically. No orofacial dyskinesias. She has an intermittent lower lip tremor, and an intermittent neck tremor/head-bobbing. Obvious voice tremor.  Chest: Clear to auscultation without wheezing, rhonchi or crackles noted.  Heart: S1+S2+0, regular and normal without murmurs, rubs or gallops noted.   Abdomen: Soft, non-tender and non-distended with normal bowel sounds appreciated on auscultation.  Extremities: There is no pitting edema in the distal lower extremities bilaterally.  Skin: Warm and dry without trophic changes noted.   Musculoskeletal: exam reveals no obvious joint deformities, tenderness or joint swelling or erythema, but reports lower back discomfort, some left neck pain, left hip pain.   Neurologically:  Mental status: The patient is awake, alert and oriented in all 4 spheres. Her immediate and remote memory, attention, language skills and fund of knowledge are fairly appropriate. There is no evidence of aphasia, agnosia, apraxia or anomia. Speech is clear with  normal prosody and enunciation. Thought process is linear. Mood is normal and affect is normal.   (On 07/24/2016: MMSE: 28/30, CDT: 4/4, AFT: 19/min.)  Cranial nerves II - XII are as described above under HEENT exam.  Motor exam: Normal bulk, global strength of 4+/5. Romberg is not tested due to safety concerns. Reflexes are 1+ in the UEs and absent in the lower extremities. Fine motor skills are globally mildly impaired but primarily slow.   On 07/04/2018: Archimedes spiral drawing she has no significant trembling with the right hand, slight insecurity noted, more insecurity but no trembling per se with the left hand. Handwriting with the right hand is minimally tremulous, not micrographic, fully legible.  Cerebellar testing: No dysmetria or intention tremor. There is no truncal or gait ataxia.  Sensory exam: intact to light touch in the upper and lower extremities.  Gait, station and balance: She stands with difficulty and has to push her self. She stands naturally a little wider-based. She is able to walk some without her rolling walker but is insecure and has a limp on the left, complains of left hip pain. She maneuvers her walker okay. She has no shuffling. She has preserved arm swing bilaterally. Tandem walk is not possible for her.  Assessment and Plan:   In summary, Brittany Atkins is a very pleasant 82 year old female with an underlying complex medical history of arthritis, diabetes, hypertension, hyperlipidemia, NPH with status post VP shunt placement in 2011, low back pain with status post  back surgery 10 years ago, who presents for evaluation of her tremor. She also had recent abnormal involuntary movements in keeping with tardive dyskinesias for which there is no obvious evidence at this point and thankfully she has improved in that regard. She has no significant hand tremor at this time but does have a lip and head tremor, in conjunction with her family history of tremor she likely  has a mild form of essential tremor. She has responded to low-dose primidone and can certainly increase to 50 mg each night, which was your plan, as I understand. She is advised about the potential side effects of Mysoline including balance problems, headaches, GI problems. However, she seems to tolerate it well at this lower dose. She is encouraged to maintaining a healthy lifestyle in general. I encouraged the patient to eat healthy, exercise daily (within her mobility limitations of course) and keep well hydrated with water to keep a scheduled bedtime and wake time routine. She does report that she tends to go to bed late ("night owl"). I suggested as needed follow-up. I answered all their questions today and the patient and her husband were in agreement. Thank you very much for allowing me to participate in the care of this nice patient. If I can be of any further assistance to you please do not hesitate to call me at [phone].  Sincerely,   Star Age, MD, PhD

## 2018-07-04 NOTE — Patient Instructions (Addendum)
I do believe you have a mild form of essential tremor, you have a family history of tremor as well.  Your tremor is in the mild range, noticeable in the lip and head area.  I believe you can continue with low dose Mysoline (primidone) 50 mg strength. Dr. Jimmye Norman wants you to increase the dose: Take 1 pill each bedtime. Keep in mind, common side effects reported are: Sleepiness, drowsiness, balance problems, confusion, and GI related symptoms. I can see you back as needed.  Please stay well-hydrated with water. Use your walker at all times for gait safety. Please talk to Dr. Jimmye Norman about your left hip pain.

## 2018-09-04 ENCOUNTER — Ambulatory Visit (INDEPENDENT_AMBULATORY_CARE_PROVIDER_SITE_OTHER): Payer: Medicare (Managed Care) | Admitting: Neurology

## 2018-09-04 ENCOUNTER — Encounter: Payer: Self-pay | Admitting: Neurology

## 2018-09-04 VITALS — BP 166/90 | HR 79 | Ht 62.0 in | Wt 173.0 lb

## 2018-09-04 DIAGNOSIS — R259 Unspecified abnormal involuntary movements: Secondary | ICD-10-CM

## 2018-09-04 NOTE — Patient Instructions (Addendum)
Please reduce the mysoline, as the increase in dose, may have resulted in the abnormal jerking.  I am not sure, how to explain your involuntary jerking, it seems to be triggered by stress. As discussed, I will order an EEG (brainwave test), which we will schedule. We will call you with the results. Please talk to Dr. Jimmye Norman about reducing sedating medications and about reducing the Mysoline.

## 2018-09-04 NOTE — Progress Notes (Signed)
Subjective:    Patient ID: Brittany Atkins is a 82 y.o. female.  HPI     Interim history:   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 The patient is accompanied by her daughter and young GD today. I recently saw her on 07/04/18 for tremors. She was on Mysoline at the time and was advised to continue with it.   Today, 09/04/2018: She reports involuntary movements affecting her upper or lower body, typically at times of stress. She has a brief split-second jerk affecting different parts of the body, does not lose consciousness, feels like she may lose balance if it happens while walking. Her daughter reports that it seems to have been happening the past month especially around Thanksgiving and around times of stress. The patient is tearful at times. She is worried about a lot of issues, she is in chronic pain. She has increased her primidone to 1 pill twice daily and it may have correlated with the onset of her involuntary jerking. She has had blood work through Dr. Jimmye Norman and we received test results by fax from 08/29/2018 indicating a blood sugar level of 114, creatinine 1.09, slightly reduced sodium at 132 and slightly reduced potassium at 3.4. Chloride was low at 91.  Previously:   07/04/2018: (She) reports intermittent tremors affecting her hands and her head and neck and lip areas. She reports a family history of tremor affecting her mother and sister. She also had a recent problem with involuntary movements affecting her tongue and mouth, in keeping with tardive dyskinesia, which was deemed secondary to Compazine. She was given Compazine for headache management in the recent past. After the Compazine  was discontinued her involuntary movements improved. She has been started on primidone low dose, half a pill at that time and was told to increase it to a whole pill, she has not started that yet. With primidone, she feels an improvement of the tremor. She uses a rolling walker. She tries to hydrate well with water but also likes to have a steep, up to 3 cups per day on average.  I evaluated her 2 years ago for gait disorder and her memory. I did not suggest any new medications at the time.   For her VPS, she has been followed by neurosurgery through [facility].   07/24/2016: (She) has difficulty walking and memory loss. She was diagnosed with normal pressure hydrocephalus several years prior to her shunt placement, then moved to [place] and moved back to [place] about 3 years ago. [facility], goes twice weekly, Mondays and Wednesdays for about 4 hours each time and has physical therapy and occupational therapy there.  She had a head CT without contrast on 03/28/2011 which I reviewed: IMPRESSION: No acute intracranial abnormality. Atrophy, chronic microvascular disease.  She was diagnosed with  TIA in May 2017. She was at [facility]. Records are not available for my review today. Symptoms  Included bilateral leg weakness and slurring of speech as I understand. She did not have one-sided weakness or numbness or facial droop. She was seen by her Neurosurgeon at the time and shunt was adjusted per daughter. She has not seen her neurosurgeon since then, but there may be  an insurance change as well, requiring her to change to another NSG practice.  She has had low back pain. She had seen orthopedics for right hip pain before and received an injection into the right hip. She was told that she may need a referral for low back pain to another specialist and that she may benefit from an ESI.  She has been using a rolling walker.  Thankfully, she has not fallen  Recently but has come  close to several times. Memory wise, she has trouble focusing and has short-term memory problems as well as trouble with complex tasks such as cooking. She does not always eat well. She is not sure if she drinks enough. She feels like she has something to drink all the time. During her hospitalization in May she was told she had a urinary tract infection and had severe constipation with impaction. She lives with her husband, who has Parkinson's disease, and a single story home. One daughter and one son live in [place], her youngest daughter lives locally in [place]. Of note, she was diagnosed with obstructive sleep apnea and placed on CPAP therapy, which she could not tolerate as the headgear bothered her shunt tubing and caused pain. Per daughter, sleep apnea was mild.  Her Past Medical History Is Significant For: Past Medical History:  Diagnosis Date  . Allergy    seasonal  . Anxiety   . Asthma with allergic rhinitis   . Cataract    surgery  . Cervical radiculopathy   . Chronic kidney disease    ladder is not filling up/ usually incontinent  . Chronic pain   . Diabetes mellitus without complication ([place])   . Fibromyalgia   . GERD (gastroesophageal reflux disease)   . H/O TIA (transient ischemic attack) and stroke   . Headache   . HLD (hyperlipidemia)   . Hypertension   . IBS (irritable bowel syndrome)   . Insomnia   . OSA (obstructive sleep apnea)   . Osteoarthritis   . PAD (peripheral artery disease) ([place])   . Positive PPD   . Sleep apnea    doesn't use her CPAP  . Stroke [facility])    TIA's; MAy 2017  . Thyroid disease   . 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 brain shunt     hydrocephalis  . CATARACT EXTRACTION, BILATERAL  2014  . HIP SURGERY  2009   hip muscle  . HYDROCELE EXCISION / REPAIR  07/2010  . LAPAROSCOPIC ABDOMINAL EXPLORATION N/A  02/07/2018   Procedure: LAPAROSCOPIC ABDOMINAL EXPLORATION;  Surgeon: Alphonsa Overall, MD;  Location: [facility];  Service: General;  Laterality: N/A;  possible laparoscopic appendectomy, need pathology report  . LUMBAR LAMINECTOMY  2009  . OVARIAN CYST REMOVAL     with  fallopian tube  . TOTAL ABDOMINAL HYSTERECTOMY      Her Family History Is Significant For: Family History  Problem Relation Age of Onset  . Hypertension Mother   . Hyperlipidemia Mother   . Heart disease Mother   . Breast cancer Mother   . Diabetes Father   . Hypertension Father   . Stroke Father   . Prostate cancer Father   . Diabetes Sister   . Diabetes Brother        x 2  . Heart attack Maternal Grandmother   . Heart  attack Maternal Grandfather   . Thyroid cancer Paternal Grandmother   . Hyperthyroidism Maternal Aunt        x 2    Her Social History Is Significant For: Social History   Socioeconomic History  . Marital status: Married    Spouse name: Not on file  . Number of children: 3  . Years of education: college  . Highest education level: Not on file  Occupational History  . Occupation: retired  Scientific laboratory technician  . Financial resource strain: Not on file  . Food insecurity:    Worry: Not on file    Inability: Not on file  . Transportation needs:    Medical: Not on file    Non-medical: Not on file  Tobacco Use  . Smoking status: Never Smoker  . Smokeless tobacco: Never Used  Substance and Sexual Activity  . Alcohol use: No  . Drug use: No  . Sexual activity: Not on file  Lifestyle  . Physical activity:    Days per week: Not on file    Minutes per session: Not on file  . Stress: Not on file  Relationships  . Social connections:    Talks on phone: Not on file    Gets together: Not on file    Attends religious service: Not on file    Active member of club or organization: Not on file    Attends meetings of clubs or organizations: Not on file    Relationship status: Not on file  Other Topics  Concern  . Not on file  Social History Narrative   Drinks 1 cup of coffee a day, also drinks green tea     Her Allergies Are:  Allergies  Allergen Reactions  . Cefdinir   . Clindamycin/Lincomycin   . Codeine Nausea Only  . Cymbalta [Duloxetine Hcl]   . Sulfamethizole   . Tuberculin Tests     Hx of positive PPd  . Oxycodone Rash  :   Her Current Medications Are:  Outpatient Encounter Medications as of 09/04/2018  Medication Sig  . ACYCLOVIR PO Take 1 tablet by mouth daily as needed (fever blisters).  Marland Kitchen albuterol (PROAIR HFA) 108 (90 Base) MCG/ACT inhaler Inhale 2 puffs into the lungs every 6 (six) hours as needed for wheezing or shortness of breath.   Marland Kitchen aspirin 81 MG tablet Take 81 mg by mouth daily.  Marland Kitchen atorvastatin (LIPITOR) 40 MG tablet Take 40 mg by mouth daily.  Marland Kitchen azelastine (OPTIVAR) 0.05 % ophthalmic solution Place 1 drop into both eyes 2 (two) times daily.   Marland Kitchen dicyclomine (BENTYL) 10 MG capsule Take 1-2 capsules (10-20 mg total) by mouth every 8 (eight) hours as needed for spasms.  . famotidine (PEPCID) 40 MG tablet Take 40 mg by mouth daily.  . fexofenadine (ALLEGRA) 30 MG tablet Take 30 mg by mouth 2 (two) times daily.  . fluticasone (FLONASE) 50 MCG/ACT nasal spray Place into both nostrils daily.  Marland Kitchen gabapentin (NEURONTIN) 600 MG tablet Take 600 mg by mouth 3 (three) times daily.   . hydrochlorothiazide (HYDRODIURIL) 25 MG tablet Take 25 mg by mouth daily.  . Lactobacillus (ACIDOPHILUS) 100 MG CAPS Take by mouth.  . loratadine (CLARITIN) 10 MG tablet Take 10 mg by mouth daily.  Marland Kitchen losartan (COZAAR) 50 MG tablet Take 75 mg by mouth daily.  . Melatonin 5 MG TABS Take 1 tablet by mouth at bedtime.  . metFORMIN (GLUCOPHAGE) 500 MG tablet Take by mouth 2 (two) times daily with  a meal.  . ondansetron (ZOFRAN ODT) 4 MG disintegrating tablet Take 1 tablet (4 mg total) by mouth every 6 (six) hours as needed for nausea or vomiting.  . primidone (MYSOLINE) 50 MG tablet Take 25 mg  by mouth at bedtime.   . Probiotic Product (RISAQUAD-2 PO) Take 2 capsules by mouth daily.  . traMADol (ULTRAM) 50 MG tablet Take 50 mg by mouth 3 (three) times daily as needed.   No facility-administered encounter medications on file as of 09/04/2018.   :  Review of Systems:  Out of a complete 14 point review of systems, all are reviewed and negative with the exception of these symptoms as listed below:  Review of Systems  Neurological:       Pt presents today to discuss her occasionally jerking movements. They seem to occur at stressful times or when she has physically exerted herself. Pt is unsure of her medications. Pt has not received blood work results from [facility] yet.    Objective:  Neurological Exam  Physical Exam Physical Examination:   Vitals:   09/04/18 1022  BP: (!) 166/90  Pulse: 79   General Examination: The patient is a very pleasant 82 y.o. female in no acute distress. She appears well-developed and well-nourished and well groomed. Somewhat anxious and near-tears.   HEENT:Normocephalic, atraumatic, pupils are equal, round and reactive to light and accommodation. Extraocular tracking is good without limitation to gaze excursion or nystagmus noted. Hearing is grossly intact. Face is symmetric with normal facial animation and normal facial sensation. Speech is clear with no dysarthria noted. There is no hypophonia. Neck is supple with full range of passive and active motion. Oropharynx exam reveals: mildmouth dryness, adequatedental hygiene. Tongue protrudes centrally and palate elevates symmetrically.No orofacial dyskinesias. She has an intermittent lower lip tremor, no significant head tremor.no significant voice tremor.  Chest:Clear to auscultation without wheezing, rhonchi or crackles noted.  Heart:S1+S2+0, regular and normal without murmurs, rubs or gallops noted.   Abdomen:Soft, non-tender and non-distended with normal bowel sounds appreciated on  auscultation.  Extremities:There isnopitting edema in the distal lower extremities bilaterally.  Skin: Warm and dry without trophic changes noted.   Musculoskeletal: exam reveals no obvious joint deformities, tenderness or joint swelling or erythema, but reports lower back discomfort, some left neck pain, left hip pain.   Neurologically:  Mental status: The patient is awake, alert and oriented in all 4 spheres.Herimmediate and remote memory, attention, language skills and fund of knowledge are fairlyappropriate. There is no evidence of aphasia, agnosia, apraxia or anomia. Speech is clear with normal prosody and enunciation. Thought process is linear. Mood isnormaland affect is normal.  (On10/30/2017: MMSE:28/30, CDT:4/4, AFT:19/min.)  Cranial nerves II - XII are as described above under HEENT exam.  Motor exam: Normal bulk, global strength of 4+/5. Romberg is not tested due to safety concerns. Reflexes are 1+ in the UEs and absent in the lower extremities. Fine motor skills are globally mildly impaired but primarily slow. No obvious myoclonus, no athetoid movements, no obvious hand tremor today.  (On 07/04/2018: Archimedes spiral drawing she has no significant trembling with the right hand, slight insecurity noted, more insecurity but no trembling per se with the left hand. Handwriting with the right hand is minimally tremulous, not micrographic, fully legible.)  Cerebellar testing: No dysmetria or intention tremor. There is no truncal or gait ataxia.  Sensory exam: intact to light touch in the upper and lower extremities.  Gait, station and balance:Shestands with difficulty and has to  push her self. She stands naturally a little wider-based. She she walks with a rolling walker with seat. She has no trouble maneuvering her walker, she walks a little slowly but has no obvious involuntary movements while walking, no shuffling.  Assessmentand Plan:   In  summary,Sabriah Sheareris a very pleasant 89 year oldfemalewith an underlying complex medical history of arthritis, diabetes, hypertension, hyperlipidemia, NPH with status post VP shunt placement in 2011, low back pain with status post back surgery 10 years ago, who presents for evaluation of her jerk-like involuntary movements past month or so. The only change in her medication was an increase in my spleen and while myoclonus or involuntary movements are not a common side effect of Mysoline, it may be worthwhile reducing it back to 1 pill a day, she is encouraged to discuss this with her primary care physician. She has no obvious involuntary movements currently and her overall tremor seems to be improved on the current dose of Mysoline. There is no evident dyskinesia, no myoclonus, no athetoid movements and no significant tremor today. Blood test results from last week show slightly reduced sodium level and potassium level as well as chloride, she has had issues with electrolyte disturbance before, similar disturbance was noted and blood work from May 2019. She is encouraged to follow-up with her primary care physician and discuss possible reduction in Mysoline. I will go ahead and request an EEG for reassurance but the description of her movements are not in keeping with an underlying seizure disorder. We will call her with her EEG results. I will see her back as needed. I answered all their questions today and the patient and her daughter were in agreement.

## 2018-10-22 ENCOUNTER — Other Ambulatory Visit: Payer: Medicare (Managed Care)

## 2018-10-25 ENCOUNTER — Other Ambulatory Visit: Payer: Self-pay | Admitting: Internal Medicine

## 2018-10-29 ENCOUNTER — Other Ambulatory Visit: Payer: Self-pay | Admitting: Family Medicine

## 2018-10-30 ENCOUNTER — Other Ambulatory Visit: Payer: Self-pay | Admitting: Family Medicine

## 2018-10-30 DIAGNOSIS — M545 Low back pain, unspecified: Secondary | ICD-10-CM

## 2018-11-06 ENCOUNTER — Ambulatory Visit
Admission: RE | Admit: 2018-11-06 | Discharge: 2018-11-06 | Disposition: A | Discharge status: Home / Self Care | Payer: Medicare (Managed Care) | Source: Ambulatory Visit | Attending: Family Medicine | Admitting: Family Medicine

## 2018-11-06 DIAGNOSIS — M545 Low back pain, unspecified: Secondary | ICD-10-CM

## 2018-11-12 ENCOUNTER — Other Ambulatory Visit: Payer: Self-pay | Admitting: Internal Medicine

## 2018-11-12 ENCOUNTER — Other Ambulatory Visit (HOSPITAL_COMMUNITY): Payer: Self-pay | Admitting: Internal Medicine

## 2018-11-18 ENCOUNTER — Other Ambulatory Visit (HOSPITAL_COMMUNITY): Payer: Self-pay | Admitting: Internal Medicine

## 2018-11-18 DIAGNOSIS — M545 Low back pain, unspecified: Secondary | ICD-10-CM

## 2018-12-04 ENCOUNTER — Ambulatory Visit (HOSPITAL_COMMUNITY)
Admission: RE | Admit: 2018-12-04 | Discharge: 2018-12-04 | Disposition: A | Discharge status: Home / Self Care | Payer: Medicare (Managed Care) | Source: Ambulatory Visit | Attending: Internal Medicine | Admitting: Internal Medicine

## 2018-12-04 ENCOUNTER — Other Ambulatory Visit: Payer: Self-pay

## 2018-12-04 DIAGNOSIS — M545 Low back pain, unspecified: Secondary | ICD-10-CM

## 2019-07-03 ENCOUNTER — Other Ambulatory Visit: Payer: Self-pay | Admitting: Internal Medicine

## 2019-07-03 DIAGNOSIS — Z1231 Encounter for screening mammogram for malignant neoplasm of breast: Secondary | ICD-10-CM

## 2019-08-18 ENCOUNTER — Ambulatory Visit: Payer: Medicare (Managed Care)

## 2019-08-19 ENCOUNTER — Other Ambulatory Visit: Payer: Self-pay

## 2019-08-19 ENCOUNTER — Ambulatory Visit
Admission: RE | Admit: 2019-08-19 | Discharge: 2019-08-19 | Disposition: A | Discharge status: Home / Self Care | Payer: Medicare (Managed Care) | Source: Ambulatory Visit | Attending: Internal Medicine | Admitting: Internal Medicine

## 2019-08-19 DIAGNOSIS — Z1231 Encounter for screening mammogram for malignant neoplasm of breast: Secondary | ICD-10-CM

## 2019-08-22 ENCOUNTER — Emergency Department (HOSPITAL_BASED_OUTPATIENT_CLINIC_OR_DEPARTMENT_OTHER)
Admission: EM | Admit: 2019-08-22 | Discharge: 2019-08-22 | Disposition: A | Discharge status: Home / Self Care | Payer: Medicare (Managed Care) | Attending: Emergency Medicine | Admitting: Emergency Medicine

## 2019-08-22 ENCOUNTER — Emergency Department (HOSPITAL_BASED_OUTPATIENT_CLINIC_OR_DEPARTMENT_OTHER): Payer: Medicare (Managed Care)

## 2019-08-22 ENCOUNTER — Other Ambulatory Visit: Payer: Self-pay

## 2019-08-22 ENCOUNTER — Encounter (HOSPITAL_BASED_OUTPATIENT_CLINIC_OR_DEPARTMENT_OTHER): Payer: Self-pay | Admitting: *Deleted

## 2019-08-22 DIAGNOSIS — Z7982 Long term (current) use of aspirin: Secondary | ICD-10-CM | POA: Diagnosis not present

## 2019-08-22 DIAGNOSIS — R110 Nausea: Secondary | ICD-10-CM | POA: Insufficient documentation

## 2019-08-22 DIAGNOSIS — I129 Hypertensive chronic kidney disease with stage 1 through stage 4 chronic kidney disease, or unspecified chronic kidney disease: Secondary | ICD-10-CM | POA: Insufficient documentation

## 2019-08-22 DIAGNOSIS — Z8673 Personal history of transient ischemic attack (TIA), and cerebral infarction without residual deficits: Secondary | ICD-10-CM | POA: Insufficient documentation

## 2019-08-22 DIAGNOSIS — N189 Chronic kidney disease, unspecified: Secondary | ICD-10-CM | POA: Insufficient documentation

## 2019-08-22 DIAGNOSIS — Z7984 Long term (current) use of oral hypoglycemic drugs: Secondary | ICD-10-CM | POA: Insufficient documentation

## 2019-08-22 DIAGNOSIS — Z79899 Other long term (current) drug therapy: Secondary | ICD-10-CM | POA: Diagnosis not present

## 2019-08-22 DIAGNOSIS — R197 Diarrhea, unspecified: Secondary | ICD-10-CM | POA: Insufficient documentation

## 2019-08-22 DIAGNOSIS — R5383 Other fatigue: Secondary | ICD-10-CM | POA: Diagnosis not present

## 2019-08-22 DIAGNOSIS — J069 Acute upper respiratory infection, unspecified: Secondary | ICD-10-CM | POA: Diagnosis not present

## 2019-08-22 DIAGNOSIS — Z20828 Contact with and (suspected) exposure to other viral communicable diseases: Secondary | ICD-10-CM | POA: Diagnosis not present

## 2019-08-22 DIAGNOSIS — E876 Hypokalemia: Secondary | ICD-10-CM

## 2019-08-22 DIAGNOSIS — R0981 Nasal congestion: Secondary | ICD-10-CM | POA: Insufficient documentation

## 2019-08-22 DIAGNOSIS — R05 Cough: Secondary | ICD-10-CM | POA: Diagnosis present

## 2019-08-22 DIAGNOSIS — E1122 Type 2 diabetes mellitus with diabetic chronic kidney disease: Secondary | ICD-10-CM | POA: Insufficient documentation

## 2019-08-22 DIAGNOSIS — R0602 Shortness of breath: Secondary | ICD-10-CM | POA: Insufficient documentation

## 2019-08-22 LAB — CBC WITH DIFFERENTIAL/PLATELET
Abs Immature Granulocytes: 0.01 10*3/uL (ref 0.00–0.07)
Basophils Absolute: 0.1 10*3/uL (ref 0.0–0.1)
Basophils Relative: 1 %
Eosinophils Absolute: 0.1 10*3/uL (ref 0.0–0.5)
Eosinophils Relative: 1 %
HCT: 39 % (ref 36.0–46.0)
Hemoglobin: 12.9 g/dL (ref 12.0–15.0)
Immature Granulocytes: 0 %
Lymphocytes Relative: 32 %
Lymphs Abs: 1.9 10*3/uL (ref 0.7–4.0)
MCH: 29.9 pg (ref 26.0–34.0)
MCHC: 33.1 g/dL (ref 30.0–36.0)
MCV: 90.5 fL (ref 80.0–100.0)
Monocytes Absolute: 0.7 10*3/uL (ref 0.1–1.0)
Monocytes Relative: 11 %
Neutro Abs: 3.4 10*3/uL (ref 1.7–7.7)
Neutrophils Relative %: 55 %
Platelets: 186 10*3/uL (ref 150–400)
RBC: 4.31 MIL/uL (ref 3.87–5.11)
RDW: 13 % (ref 11.5–15.5)
WBC: 6.2 10*3/uL (ref 4.0–10.5)
nRBC: 0 % (ref 0.0–0.2)

## 2019-08-22 LAB — BASIC METABOLIC PANEL
Anion gap: 11 (ref 5–15)
BUN: 17 mg/dL (ref 8–23)
CO2: 28 mmol/L (ref 22–32)
Calcium: 9.2 mg/dL (ref 8.9–10.3)
Chloride: 92 mmol/L — ABNORMAL LOW (ref 98–111)
Creatinine, Ser: 0.84 mg/dL (ref 0.44–1.00)
GFR calc Af Amer: >60 mL/min (ref >60)
GFR calc non Af Amer: >60 mL/min (ref >60)
Glucose, Bld: 102 mg/dL — ABNORMAL HIGH (ref 70–99)
Potassium: 2.8 mmol/L — ABNORMAL LOW (ref 3.5–5.1)
Sodium: 131 mmol/L — ABNORMAL LOW (ref 135–145)

## 2019-08-22 LAB — SARS CORONAVIRUS 2 AG (30 MIN TAT): SARS Coronavirus 2 Ag: NEGATIVE

## 2019-08-22 MED ORDER — POTASSIUM CHLORIDE CRYS ER 20 MEQ PO TBCR: 20.0000 meq | EXTENDED_RELEASE_TABLET | Freq: Every day | ORAL | 0 refills | Status: DC

## 2019-08-22 MED ORDER — POTASSIUM CHLORIDE CRYS ER 20 MEQ PO TBCR
40.0000 meq | EXTENDED_RELEASE_TABLET | Freq: Once | ORAL | Status: AC
  Administered 2019-08-22: 40 meq via ORAL
  Filled 2019-08-22: qty 2

## 2019-08-22 NOTE — ED Provider Notes (Signed)
[facility] EMERGENCY DEPARTMENT Provider Note   CSN: [id number] Arrival date & time: 08/22/19  1629     History   Chief Complaint Chief Complaint  Patient presents with   Cough    HPI Brittany Atkins is a 83 y.o. female.     Patient is a 83 year old female who presents with cough and fatigue.  She started feeling bad yesterday with cough and some runny nose and nasal congestion.  She felt worse today with worsening cough and some minor shortness of breath.  She does have a history of asthma and uses inhalers at home.  She denies any fevers.  She has had some mild nausea and some loose stools.  She has some intermittent abdominal cramping.  No vomiting.  No known sick contacts.  She has a little bit of soreness in the center of her chest.     Past Medical History:  Diagnosis Date   Allergy    seasonal   Anxiety    Asthma with allergic rhinitis    Cataract    surgery   Cervical radiculopathy    Chronic kidney disease    ladder is not filling up/ usually incontinent   Chronic pain    Diabetes mellitus without complication (HCC)    Fibromyalgia    GERD (gastroesophageal reflux disease)    H/O TIA (transient ischemic attack) and stroke    Headache    HLD (hyperlipidemia)    Hypertension    IBS (irritable bowel syndrome)    Insomnia    OSA (obstructive sleep apnea)    Osteoarthritis    PAD (peripheral artery disease) ([place])    Positive PPD    Sleep apnea    doesn't use her CPAP   Stroke ([place])    TIA's; MAy 2017   Thyroid disease    Tuberculosis    positive tb tests all of her life/ 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    Patient Active Problem List   Diagnosis Date Noted   Acute appendicitis 02/06/2018   Memory loss 04/21/2016   Lumbar radiculopathy 123456   Diastolic dysfunction 99991111   History of peptic ulcer disease 02/14/2016   Chronic GERD 12/08/2015   Irritable bowel syndrome with constipation 12/08/2015    Diabetes mellitus type 2 in obese ([place]) 12/08/2015   Benign essential HTN 08/18/2015   Fibromyalgia 08/18/2015   Obstructive sleep apnea 08/18/2015   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 08/18/2014    Past Surgical History:  Procedure Laterality Date   brain shunt     hydrocephalis   CATARACT EXTRACTION, BILATERAL  2014   HIP SURGERY  2009   hip muscle   HYDROCELE EXCISION / REPAIR  07/2010   LAPAROSCOPIC ABDOMINAL EXPLORATION N/A 02/07/2018   Procedure: LAPAROSCOPIC ABDOMINAL EXPLORATION;  Surgeon: Alphonsa Overall, MD;  Location: [facility];  Service: General;  Laterality: N/A;  possible laparoscopic appendectomy, need pathology report   LUMBAR LAMINECTOMY  2009   OVARIAN CYST REMOVAL     with  fallopian tube   TOTAL ABDOMINAL HYSTERECTOMY       OB History   No obstetric history on file.      Home Medications    Prior to Admission medications   Medication Sig Start Date End Date Taking? Authorizing Provider  ACYCLOVIR PO Take 1 tablet by mouth daily as needed (fever blisters).    [provider]  albuterol (PROAIR HFA) 108 (90 Base) MCG/ACT inhaler Inhale 2 puffs  into the lungs every 6 (six) hours as needed for wheezing or shortness of breath.     [provider]  aspirin 81 MG tablet Take 81 mg by mouth daily.    [provider]  atorvastatin (LIPITOR) 40 MG tablet Take 40 mg by mouth daily.    [provider]  azelastine (OPTIVAR) 0.05 % ophthalmic solution Place 1 drop into both eyes 2 (two) times daily.     [provider]  dicyclomine (BENTYL) 10 MG capsule Take 1-2 capsules (10-20 mg total) by mouth every 8 (eight) hours as needed for spasms. 11/09/16   Yetta Flock, MD  famotidine (PEPCID) 40 MG tablet Take 40 mg by mouth daily.    [provider]  fexofenadine (ALLEGRA) 30 MG tablet Take 30 mg by mouth 2 (two) times daily.    [provider]  fluticasone (FLONASE) 50 MCG/ACT nasal spray Place into both nostrils daily.    [provider]  gabapentin (NEURONTIN) 600 MG tablet Take 600 mg by mouth 3 (three) times daily.     [provider]  hydrochlorothiazide (HYDRODIURIL) 25 MG tablet Take 25 mg by mouth daily.    [provider]  Lactobacillus (ACIDOPHILUS) 100 MG CAPS Take by mouth.    [provider]  loratadine (CLARITIN) 10 MG tablet Take 10 mg by mouth daily.    [provider]  losartan (COZAAR) 50 MG tablet Take 75 mg by mouth daily.    [provider]  Melatonin 5 MG TABS Take 1 tablet by mouth at bedtime.    [provider]  metFORMIN (GLUCOPHAGE) 500 MG tablet Take by mouth 2 (two) times daily with a meal.    [provider]  ondansetron (ZOFRAN ODT) 4 MG disintegrating tablet Take 1 tablet (4 mg total) by mouth every 6 (six) hours as needed for nausea or vomiting. 11/09/16   Armbruster, Carlota Raspberry, MD  potassium chloride SA (KLOR-CON) 20 MEQ tablet Take 1 tablet (20 mEq total) by mouth daily. 08/22/19   Malvin Johns, MD  primidone (MYSOLINE) 50 MG tablet Take 25 mg by mouth at bedtime.     [provider]  Probiotic Product (RISAQUAD-2 PO) Take 2 capsules by mouth daily.    [provider]  traMADol (ULTRAM) 50 MG tablet Take 50 mg by mouth 3 (three) times daily as needed.    [provider]    Family History Family History  Problem Relation Age of Onset   Hypertension Mother    Hyperlipidemia Mother    Heart disease Mother    Breast cancer Mother    Diabetes Father    Hypertension Father    Stroke Father    Prostate cancer Father    Diabetes Sister    Diabetes Brother        x 2   Heart attack Maternal Grandmother    Heart attack Maternal Grandfather    Thyroid cancer Paternal Grandmother    Hyperthyroidism Maternal Aunt        x 2    Social History Social History   Tobacco Use    Smoking status: Never Smoker   Smokeless tobacco: Never Used  Substance Use Topics   Alcohol use: No   Drug use: No     Allergies   Cefdinir, Clindamycin/lincomycin, Codeine, Cymbalta [duloxetine hcl], Sulfamethizole, Tuberculin tests, and Oxycodone   Review of Systems Review of Systems  Constitutional: Positive for fatigue. Negative for chills, diaphoresis and fever.  HENT: Positive for congestion and rhinorrhea. Negative for sneezing.   Eyes: Negative.   Respiratory: Positive for cough and shortness of breath. Negative for chest tightness.   Cardiovascular: Positive for chest pain. Negative for leg swelling.  Gastrointestinal: Positive for abdominal pain (Intermittent cramping), diarrhea and nausea. Negative for blood in stool and vomiting.  Genitourinary: Negative for difficulty urinating, flank pain, frequency and hematuria.  Musculoskeletal: Positive for myalgias. Negative for arthralgias and back pain.  Skin: Negative for rash.  Neurological: Negative for dizziness, speech difficulty, weakness, numbness and headaches.     Physical Exam Updated Vital Signs BP (!) 142/68    Pulse (!) 55    Temp 98.8 F (37.1 C) (Oral)    Resp 11    Ht 5' (1.524 m)    Wt 75.3 kg    SpO2 97%    BMI 32.42 kg/m   Physical Exam Constitutional:      Appearance: She is well-developed.  HENT:     Head: Normocephalic and atraumatic.  Eyes:     Pupils: Pupils are equal, round, and reactive to light.  Neck:     Musculoskeletal: Normal range of motion and neck supple.  Cardiovascular:     Rate and Rhythm: Normal rate and regular rhythm.     Heart sounds: Normal heart sounds.  Pulmonary:     Effort: Pulmonary effort is normal. No respiratory distress.     Breath sounds: Normal breath sounds. No wheezing or rales.  Chest:     Chest wall: No tenderness.  Abdominal:     General: Bowel sounds are normal.     Palpations: Abdomen is soft.     Tenderness: There is no abdominal tenderness.  There is no guarding or rebound.  Musculoskeletal: Normal range of motion.        General: No swelling or tenderness.  Lymphadenopathy:     Cervical: No cervical adenopathy.  Skin:    General: Skin is warm and dry.     Findings: No rash.  Neurological:     Mental Status: She is alert and oriented to person, place, and time.      ED Treatments / Results  Labs (all labs ordered are listed, but only abnormal results are displayed) Labs Reviewed  BASIC METABOLIC PANEL - Abnormal; Notable for the following components:      Result Value   Sodium 131 (*)    Potassium 2.8 (*)    Chloride 92 (*)    Glucose, Bld 102 (*)    All other components within normal limits  SARS CORONAVIRUS 2 AG (30 MIN TAT)  NOVEL CORONAVIRUS, NAA (HOSP ORDER, SEND-OUT TO REF LAB; TAT 18-24 HRS)  CBC WITH DIFFERENTIAL/PLATELET    EKG EKG Interpretation  Date/Time:  Friday August 22 2019 17:53:46 EST Ventricular Rate:  52 PR Interval:    QRS Duration: 87 QT Interval:  433 QTC Calculation: 403 R Axis:   41 Text Interpretation: Sinus rhythm Low voltage, precordial leads No old tracing to compare Confirmed by Malvin Johns [id number]) on 08/22/2019 6:50:20 PM   Radiology Dg Chest Port 1 View  Result Date: 08/22/2019 CLINICAL DATA:  Cough, shortness of breath. EXAM: PORTABLE CHEST 1 VIEW COMPARISON:  March 29, 2011. FINDINGS: The heart size and mediastinal contours are within normal limits. Both lungs are clear. No pneumothorax or pleural effusion is noted. The visualized skeletal structures are unremarkable. IMPRESSION: No active disease. Aortic Atherosclerosis (ICD10-I70.0). Electronically Signed   By: Bobbe Medico.D.  On: 08/22/2019 18:31    Procedures Procedures (including critical care time)  Medications Ordered in ED Medications  potassium chloride SA (KLOR-CON) CR tablet 40 mEq (40 mEq Oral Given 08/22/19 1906)     Initial Impression / Assessment and Plan / ED Course  I have reviewed  the triage vital signs and the nursing notes.  Pertinent labs & imaging results that were available during my care of the patient were reviewed by me and considered in my medical decision making (see chart for details).       Patient presents with upper respiratory symptoms.  She has some mild shortness of breath but no hypoxia.  Her lungs have some few wheezes but she is talking in full sentences and has no accessory muscle use.  Chest x-ray is clear without evidence of pneumonia.  She is otherwise well-appearing.  Her rapid Covid was negative but a follow-up Tat is pending.  Her labs show hypokalemia.  She has had similar findings in the past.  She was started on potassium replacement.  She was instructed that she will need to have her potassium rechecked in the next few days by her PCP.  She was advised to use Covid precautions until her other test comes back.  Return precautions were given.  Instructions were given to both the patient and her daughter.  Rumor Sirman was evaluated in Emergency Department on 08/22/2019 for the symptoms described in the history of present illness. She was evaluated in the context of the global COVID-19 pandemic, which necessitated consideration that the patient might be at risk for infection with the SARS-CoV-2 virus that causes COVID-19. Institutional protocols and algorithms that pertain to the evaluation of patients at risk for COVID-19 are in a state of rapid change based on information released by regulatory bodies including the CDC and federal and state organizations. These policies and algorithms were followed during the patient's care in the ED.   Final Clinical Impressions(s) / ED Diagnoses   Final diagnoses:  Viral URI with cough  Hypokalemia    ED Discharge Orders         Ordered    potassium chloride SA (KLOR-CON) 20 MEQ tablet  Daily     08/22/19 1923           Malvin Johns, MD 08/22/19 1925

## 2019-08-22 NOTE — Discharge Instructions (Addendum)
You need to have your potassium rechecked in the next few days.  Take your home potassium as directed.  You may need to use your inhaler more frequently while you are not feeling well.  Return to the emergency room if you have any worsening symptoms including more shortness of breath, vomiting or other worsening symptoms.

## 2019-08-22 NOTE — ED Notes (Signed)
RT at bedside to listen to pt.

## 2019-08-22 NOTE — ED Notes (Signed)
XR at bedside

## 2019-08-22 NOTE — ED Triage Notes (Addendum)
Pt reports cough x 1 day. Denies fever. Wants to be checkered out "to be safe". Reports last night her chest felt sore. States she gets out of breath with talking

## 2019-08-25 LAB — NOVEL CORONAVIRUS, NAA (HOSP ORDER, SEND-OUT TO REF LAB; TAT 18-24 HRS): SARS-CoV-2, NAA: NOT DETECTED

## 2019-12-17 ENCOUNTER — Other Ambulatory Visit: Payer: Self-pay | Admitting: Internal Medicine

## 2019-12-17 ENCOUNTER — Ambulatory Visit
Admission: RE | Admit: 2019-12-17 | Discharge: 2019-12-17 | Disposition: A | Discharge status: Home / Self Care | Payer: No Typology Code available for payment source | Source: Ambulatory Visit | Attending: Internal Medicine | Admitting: Internal Medicine

## 2019-12-17 DIAGNOSIS — G4489 Other headache syndrome: Secondary | ICD-10-CM

## 2020-02-26 ENCOUNTER — Other Ambulatory Visit (HOSPITAL_COMMUNITY): Payer: Self-pay | Admitting: Internal Medicine

## 2020-02-26 ENCOUNTER — Other Ambulatory Visit: Payer: Self-pay | Admitting: Internal Medicine

## 2020-02-26 DIAGNOSIS — R1310 Dysphagia, unspecified: Secondary | ICD-10-CM

## 2020-03-04 ENCOUNTER — Ambulatory Visit (HOSPITAL_COMMUNITY): Payer: Medicare (Managed Care) | Attending: Internal Medicine

## 2020-03-04 ENCOUNTER — Encounter (HOSPITAL_COMMUNITY): Payer: Self-pay

## 2020-03-22 ENCOUNTER — Other Ambulatory Visit: Payer: Self-pay

## 2020-03-22 ENCOUNTER — Ambulatory Visit (HOSPITAL_COMMUNITY)
Admission: RE | Admit: 2020-03-22 | Discharge: 2020-03-22 | Disposition: A | Discharge status: Home / Self Care | Payer: Medicare (Managed Care) | Source: Ambulatory Visit | Attending: Internal Medicine | Admitting: Internal Medicine

## 2020-03-22 DIAGNOSIS — R1310 Dysphagia, unspecified: Secondary | ICD-10-CM | POA: Diagnosis not present

## 2020-06-11 ENCOUNTER — Other Ambulatory Visit: Payer: Self-pay | Admitting: Internal Medicine

## 2020-07-05 ENCOUNTER — Encounter (HOSPITAL_COMMUNITY): Payer: Self-pay | Admitting: Emergency Medicine

## 2020-07-05 ENCOUNTER — Observation Stay (HOSPITAL_COMMUNITY)
Admission: EM | Admit: 2020-07-05 | Discharge: 2020-07-07 | Disposition: A | Discharge status: Home / Self Care | Payer: Medicare (Managed Care) | Attending: Internal Medicine | Admitting: Internal Medicine

## 2020-07-05 ENCOUNTER — Emergency Department (HOSPITAL_COMMUNITY): Payer: Medicare (Managed Care)

## 2020-07-05 DIAGNOSIS — G459 Transient cerebral ischemic attack, unspecified: Secondary | ICD-10-CM

## 2020-07-05 DIAGNOSIS — R2681 Unsteadiness on feet: Secondary | ICD-10-CM | POA: Insufficient documentation

## 2020-07-05 DIAGNOSIS — Z7984 Long term (current) use of oral hypoglycemic drugs: Secondary | ICD-10-CM | POA: Diagnosis not present

## 2020-07-05 DIAGNOSIS — E119 Type 2 diabetes mellitus without complications: Secondary | ICD-10-CM | POA: Diagnosis not present

## 2020-07-05 DIAGNOSIS — I129 Hypertensive chronic kidney disease with stage 1 through stage 4 chronic kidney disease, or unspecified chronic kidney disease: Secondary | ICD-10-CM | POA: Diagnosis not present

## 2020-07-05 DIAGNOSIS — R531 Weakness: Secondary | ICD-10-CM | POA: Diagnosis not present

## 2020-07-05 DIAGNOSIS — R4781 Slurred speech: Secondary | ICD-10-CM | POA: Diagnosis present

## 2020-07-05 DIAGNOSIS — F458 Other somatoform disorders: Secondary | ICD-10-CM

## 2020-07-05 DIAGNOSIS — R479 Unspecified speech disturbances: Secondary | ICD-10-CM

## 2020-07-05 DIAGNOSIS — G910 Communicating hydrocephalus: Secondary | ICD-10-CM | POA: Diagnosis not present

## 2020-07-05 DIAGNOSIS — Z79899 Other long term (current) drug therapy: Secondary | ICD-10-CM | POA: Diagnosis not present

## 2020-07-05 DIAGNOSIS — G629 Polyneuropathy, unspecified: Secondary | ICD-10-CM

## 2020-07-05 DIAGNOSIS — I10 Essential (primary) hypertension: Secondary | ICD-10-CM

## 2020-07-05 DIAGNOSIS — Z20822 Contact with and (suspected) exposure to covid-19: Secondary | ICD-10-CM | POA: Diagnosis not present

## 2020-07-05 DIAGNOSIS — Z7982 Long term (current) use of aspirin: Secondary | ICD-10-CM | POA: Diagnosis not present

## 2020-07-05 DIAGNOSIS — N189 Chronic kidney disease, unspecified: Secondary | ICD-10-CM | POA: Diagnosis not present

## 2020-07-05 DIAGNOSIS — Z8673 Personal history of transient ischemic attack (TIA), and cerebral infarction without residual deficits: Secondary | ICD-10-CM

## 2020-07-05 DIAGNOSIS — R29818 Other symptoms and signs involving the nervous system: Secondary | ICD-10-CM | POA: Diagnosis present

## 2020-07-05 DIAGNOSIS — Z982 Presence of cerebrospinal fluid drainage device: Secondary | ICD-10-CM

## 2020-07-05 DIAGNOSIS — G912 (Idiopathic) normal pressure hydrocephalus: Secondary | ICD-10-CM

## 2020-07-05 LAB — URINALYSIS, ROUTINE W REFLEX MICROSCOPIC
Bacteria, UA: NONE SEEN
Bilirubin Urine: NEGATIVE
Glucose, UA: NEGATIVE mg/dL
Hgb urine dipstick: NEGATIVE
Ketones, ur: NEGATIVE mg/dL
Nitrite: NEGATIVE
Protein, ur: NEGATIVE mg/dL
Specific Gravity, Urine: 1.008 (ref 1.005–1.030)
pH: 7 (ref 5.0–8.0)

## 2020-07-05 LAB — RAPID URINE DRUG SCREEN, HOSP PERFORMED
Amphetamines: NOT DETECTED
Barbiturates: POSITIVE — AB
Benzodiazepines: NOT DETECTED
Cocaine: NOT DETECTED
Opiates: NOT DETECTED
Tetrahydrocannabinol: NOT DETECTED

## 2020-07-05 LAB — DIFFERENTIAL
Abs Immature Granulocytes: 0.01 10*3/uL (ref 0.00–0.07)
Basophils Absolute: 0 10*3/uL (ref 0.0–0.1)
Basophils Relative: 1 %
Eosinophils Absolute: 0.1 10*3/uL (ref 0.0–0.5)
Eosinophils Relative: 1 %
Immature Granulocytes: 0 %
Lymphocytes Relative: 28 %
Lymphs Abs: 1.5 10*3/uL (ref 0.7–4.0)
Monocytes Absolute: 0.5 10*3/uL (ref 0.1–1.0)
Monocytes Relative: 9 %
Neutro Abs: 3.2 10*3/uL (ref 1.7–7.7)
Neutrophils Relative %: 61 %

## 2020-07-05 LAB — CBC
HCT: 43 % (ref 36.0–46.0)
Hemoglobin: 13.9 g/dL (ref 12.0–15.0)
MCH: 30.2 pg (ref 26.0–34.0)
MCHC: 32.3 g/dL (ref 30.0–36.0)
MCV: 93.3 fL (ref 80.0–100.0)
Platelets: 155 10*3/uL (ref 150–400)
RBC: 4.61 MIL/uL (ref 3.87–5.11)
RDW: 13.2 % (ref 11.5–15.5)
WBC: 5.3 10*3/uL (ref 4.0–10.5)
nRBC: 0 % (ref 0.0–0.2)

## 2020-07-05 LAB — COMPREHENSIVE METABOLIC PANEL
ALT: 18 U/L (ref 0–44)
AST: 15 U/L (ref 15–41)
Albumin: 4.1 g/dL (ref 3.5–5.0)
Alkaline Phosphatase: 83 U/L (ref 38–126)
Anion gap: 13 (ref 5–15)
BUN: 18 mg/dL (ref 8–23)
CO2: 27 mmol/L (ref 22–32)
Calcium: 9.9 mg/dL (ref 8.9–10.3)
Chloride: 98 mmol/L (ref 98–111)
Creatinine, Ser: 0.79 mg/dL (ref 0.44–1.00)
GFR, Estimated: >60 mL/min (ref >60)
Glucose, Bld: 95 mg/dL (ref 70–99)
Potassium: 4 mmol/L (ref 3.5–5.1)
Sodium: 138 mmol/L (ref 135–145)
Total Bilirubin: 0.6 mg/dL (ref 0.3–1.2)
Total Protein: 7.2 g/dL (ref 6.5–8.1)

## 2020-07-05 LAB — PROTIME-INR
INR: 1 (ref 0.8–1.2)
Prothrombin Time: 12.6 seconds (ref 11.4–15.2)

## 2020-07-05 LAB — APTT: aPTT: 26 seconds (ref 24–36)

## 2020-07-05 MED ORDER — ACETAMINOPHEN 325 MG PO TABS
650.0000 mg | ORAL_TABLET | Freq: Four times a day (QID) | ORAL | Status: DC | PRN
  Administered 2020-07-06: 650 mg via ORAL
  Filled 2020-07-05: qty 2

## 2020-07-05 MED ORDER — PRIMIDONE 50 MG PO TABS
50.0000 mg | ORAL_TABLET | Freq: Every day | ORAL | Status: DC
  Administered 2020-07-06 (×2): 50 mg via ORAL
  Filled 2020-07-05 (×3): qty 1

## 2020-07-05 MED ORDER — CITALOPRAM HYDROBROMIDE 10 MG PO TABS
10.0000 mg | ORAL_TABLET | Freq: Every day | ORAL | Status: DC
  Administered 2020-07-06 – 2020-07-07 (×2): 10 mg via ORAL
  Filled 2020-07-05: qty 1

## 2020-07-05 MED ORDER — ACETAMINOPHEN 325 MG PO TABS
650.0000 mg | ORAL_TABLET | Freq: Once | ORAL | Status: AC
  Administered 2020-07-05: 650 mg via ORAL
  Filled 2020-07-05: qty 2

## 2020-07-05 MED ORDER — ACETAMINOPHEN 650 MG RE SUPP: 650.0000 mg | Freq: Four times a day (QID) | RECTAL | Status: DC | PRN

## 2020-07-05 MED ORDER — MELATONIN 5 MG PO TABS
10.0000 mg | ORAL_TABLET | Freq: Every day | ORAL | Status: DC
  Administered 2020-07-06 (×2): 10 mg via ORAL
  Filled 2020-07-05 (×3): qty 2

## 2020-07-05 MED ORDER — LOSARTAN POTASSIUM-HCTZ 100-25 MG PO TABS: 1.0000 | ORAL_TABLET | Freq: Every evening | ORAL | Status: DC

## 2020-07-05 MED ORDER — ENOXAPARIN SODIUM 40 MG/0.4ML ~~LOC~~ SOLN
40.0000 mg | SUBCUTANEOUS | Status: DC
  Administered 2020-07-06 – 2020-07-07 (×2): 40 mg via SUBCUTANEOUS
  Filled 2020-07-05 (×2): qty 0.4

## 2020-07-05 MED ORDER — ASPIRIN EC 81 MG PO TBEC
81.0000 mg | DELAYED_RELEASE_TABLET | Freq: Every day | ORAL | Status: DC
  Administered 2020-07-06 – 2020-07-07 (×2): 81 mg via ORAL
  Filled 2020-07-05 (×2): qty 1

## 2020-07-05 MED ORDER — ATORVASTATIN CALCIUM 40 MG PO TABS
40.0000 mg | ORAL_TABLET | Freq: Every day | ORAL | Status: DC
  Administered 2020-07-06 – 2020-07-07 (×2): 40 mg via ORAL
  Filled 2020-07-05 (×2): qty 1

## 2020-07-05 MED ORDER — GABAPENTIN 300 MG PO CAPS: 300.0000 mg | ORAL_CAPSULE | Freq: Two times a day (BID) | ORAL | Status: DC

## 2020-07-05 MED ORDER — LORAZEPAM 2 MG/ML IJ SOLN
0.5000 mg | Freq: Once | INTRAMUSCULAR | Status: AC | PRN
  Administered 2020-07-05: 0.5 mg via INTRAVENOUS
  Filled 2020-07-05: qty 1

## 2020-07-05 NOTE — Progress Notes (Signed)
Pt has reprogrammable shunt model#: [id number] and catheters [id number] and [id number]. Per our radiologist Dr. Posey Pronto, pt would need an x-ray shunt series prior and post MRI scan.

## 2020-07-05 NOTE — ED Triage Notes (Signed)
[facility] , pt has been having slurred speech and left side weakness since sat , hx of stroke , pt still here with left side facial drop and words slurred at times , no arm drift

## 2020-07-05 NOTE — H&P (Signed)
NAME:  Brittany Atkins, MRN:  [id number], DOB:  July 05, 1936, LOS: 0 ADMISSION DATE:  07/05/2020, Primary: Patient, No Pcp Per  CHIEF COMPLAINT:  Slurred speech   Medical Service: Internal Medicine Teaching Service         Attending Physician: Dr. Dorie Rank, MD    First Contact: Dr. Johnney Ou Pager: [phone]  Second Contact: Dr. Gilford Rile Pager: [phone]       After Hours (After 5p/  First Contact Pager: [phone]  weekends / holidays): Second Contact Pager: [phone]    History of present illness   Brittany Atkins is 84 year old female past medical history of hypertension, type 2 diabetes, obstructive sleep apnea, and normal pressure hydrocephalus status post VP shunt placement greater than 3 years ago. She presented to [facility] emergency department on 07/05/2020 for evaluation of slurred speech.  Initial symptoms presented on Saturday with some right-sided facial numbness.  She relates that this somewhat improved however she awoke today with a right-sided facial droop.  She presented to her 41 office for this at which time she developed right upper and lower extremity weakness.  She was instructed to seek evaluation in the emergency department. She has a past medical history of a TIA in 2017. She denies any other associated symptoms including fever, chills, syncope, lightheadedness, headache.  She reports intermittent blurry vision that is been going on for about the last 2 years.  Chart review indicates last office visit with neurosurgery for shunt evaluation was in March 2020.  Chart review indicates a history of psychogenic movement disorder which she was seen for last at [facility] in July 2021.  She has been maintained on primidone for this with minimal improvement.  ED course  Head CT was obtained in the emergency department and was unremarkable for acute changes.  Since presenting to the ED, she feels like her symptoms have improved.  Past Medical History    She,  has a past medical history of Allergy, Anxiety, Asthma with allergic rhinitis, Cataract, Cervical radiculopathy, Chronic kidney disease, Chronic pain, Diabetes mellitus without complication ([place]), Fibromyalgia, GERD (gastroesophageal reflux disease), H/O TIA (transient ischemic attack) and stroke, Headache, HLD (hyperlipidemia), Hypertension, IBS (irritable bowel syndrome), Insomnia, OSA (obstructive sleep apnea), Osteoarthritis, PAD (peripheral artery disease) ([place]), Positive PPD, Sleep apnea, Stroke ([place]), Thyroid disease, and Tuberculosis.   Home Medications     Prior to Admission medications   Medication Sig Start Date End Date Taking? Authorizing Provider  acetaminophen (TYLENOL) 500 MG tablet Take 500-1,000 mg by mouth every 6 (six) hours as needed for moderate pain.   Yes [provider]  acyclovir (ZOVIRAX) 400 MG tablet Take 400 mg by mouth 5 (five) times daily as needed (fever blisters).   Yes [provider]  Artificial Saliva (BIOTENE MOISTURIZING MOUTH MT) Use as directed 1 spray in the mouth or throat every 4 (four) hours as needed (dry mouth).   Yes [provider]  aspirin 81 MG tablet Take 81 mg by mouth daily.   Yes [provider]  atorvastatin (LIPITOR) 40 MG tablet Take 40 mg by mouth daily.   Yes [provider]  cetirizine (ZYRTEC) 10 MG tablet Take 10 mg by mouth daily.   Yes [provider]  citalopram (CELEXA) 10 MG tablet Take 10 mg by mouth daily.   Yes [provider]  dicyclomine (BENTYL) 10 MG capsule Take 1-2 capsules (10-20 mg total) by mouth every 8 (eight) hours as needed for spasms. Patient taking differently: Take 10-20  mg by mouth in the morning, at noon, in the evening, and at bedtime.  11/09/16  Yes Armbruster, Carlota Raspberry, MD  fluticasone (FLONASE) 50 MCG/ACT nasal spray Place into both nostrils daily.   Yes [provider]  gabapentin (NEURONTIN) 300 MG capsule Take 300-600 mg by  mouth 2 (two) times daily. Take 2 capsules (600 mg) in the morning and Take 1 capsule (300 mg) in the evening   Yes [provider]  guaiFENesin (ROBITUSSIN) 100 MG/5ML liquid Take 200 mg by mouth 3 (three) times daily as needed for cough.   Yes [provider]  ketotifen (ZADITOR) 0.025 % ophthalmic solution Place 1 drop into both eyes every 8 (eight) hours as needed (irritation).   Yes [provider]  Lactase (LACTAID FAST ACT) 9000 units TABS Take 1 tablet by mouth 3 (three) times daily as needed (lactose intolerance).   Yes [provider]  Lactobacillus (ACIDOPHILUS) CAPS capsule Take 2 capsules by mouth daily.   Yes [provider]  Lidocaine HCl (ASPERCREME LIDOCAINE) 4 % CREA Apply 1 application topically 3 (three) times daily as needed (pain).   Yes [provider]  losartan-hydrochlorothiazide (HYZAAR) 100-25 MG tablet Take 1 tablet by mouth every evening.   Yes [provider]  Melatonin 10 MG CAPS Take 1 capsule by mouth at bedtime.   Yes [provider]  nystatin (NYSTATIN) powder Apply 1 application topically 3 (three) times daily as needed (skin irritation).   Yes [provider]  primidone (MYSOLINE) 50 MG tablet Take 50 mg by mouth at bedtime.    Yes [provider]  sennosides-docusate sodium (SENOKOT-S) 8.6-50 MG tablet Take 2 tablets by mouth 2 (two) times daily as needed for constipation.   Yes [provider]  traMADol (ULTRAM) 50 MG tablet Take 50 mg by mouth 3 (three) times daily as needed for moderate pain.    Yes [provider]  ondansetron (ZOFRAN ODT) 4 MG disintegrating tablet Take 1 tablet (4 mg total) by mouth every 6 (six) hours as needed for nausea or vomiting. Patient not taking: Reported on 07/05/2020 11/09/16   Yetta Flock, MD  potassium chloride SA (KLOR-CON) 20 MEQ tablet Take 1 tablet (20 mEq total) by mouth daily. Patient not taking: Reported on  07/05/2020 08/22/19   Malvin Johns, MD    Allergies    Allergies as of 07/05/2020 - Review Complete 07/05/2020  Allergen Reaction Noted  . Cefdinir  07/24/2016  . Clindamycin/lincomycin  07/24/2016  . Codeine Nausea Only 07/24/2016  . Cymbalta [duloxetine hcl]  11/09/2016  . Sulfamethizole  07/24/2016  . Tuberculin tests  11/09/2016  . Oxycodone Rash 02/06/2018    Social History   reports that she has never smoked. She has never used smokeless tobacco. She reports that she does not drink alcohol and does not use drugs.   Family History   Her family history includes Breast cancer in her mother; Diabetes in her brother, father, and sister; Heart attack in her maternal grandfather and maternal grandmother; Heart disease in her mother; Hyperlipidemia in her mother; Hypertension in her father and mother; Hyperthyroidism in her maternal aunt; Prostate cancer in her father; Stroke in her father; Thyroid cancer in her paternal grandmother.    ROS  Review of Systems  Constitutional: Negative for chills and fever.  HENT: Negative.   Eyes: Positive for blurred vision. Negative for pain.  Respiratory: Negative for cough and shortness of breath.   Cardiovascular: Negative for chest pain  and palpitations.  Gastrointestinal: Positive for constipation and diarrhea.  Musculoskeletal: Negative.   Neurological: Positive for sensory change, speech change, focal weakness and weakness. Negative for dizziness, tremors, loss of consciousness and headaches.  Psychiatric/Behavioral: Negative for depression.    Objective   Blood pressure (!) 151/74, pulse (!) 54, temperature 98.6 F (37 C), temperature source Oral, resp. rate 14, SpO2 96 %.      Examination: GENERAL: in no acute distress HEENT: no drooling. VP shunt palpable in occipital region and posterior SCM. No pain on palpation CARDIAC: heart RRR. No peripheral edema.  PULMONARY: acyanotic. Lung sounds clear to auscultation. ABDOMEN:  soft. Nontender to palpation.  Nondistended.  NEURO: a/ox3. Intermittent speech stuttering that is reduced with distraction. PERRL. EOMs intact. Diminished sensation to right lower face. Left facial droop limited to lower face. Hearing intact. Tongue midline. Swallows small sips without coughing. Motor strength 1/5 on RUE and RLE. Motor strength 5/5 on left upper and lower extremities. Tongue and uvula midline. Diminished sensation to her bilateral lower extremities extrending from feet to ankles.  SKIN: no rash or lesions on limited exam  PSYCH: normal affect    Consults:  neurosurgery  Significant Diagnostic Tests:  EKG: sinus bradycardic. Isolated TWI in V1.  Head CT: No evidence of acute intracranial abnormality.  Stable position of right parietal approach ventricular catheter terminating in the left lateral ventricle.  Unchanged size and configuration of the ventricular system as compared to 03/26/2018.  Stable mild generalized cerebral atrophy and chronic small vessel ischemic disease.  Skull, chest, abdomen x-rays: Intact shunt catheter  Labs    CBC Latest Ref Rng & Units 07/05/2020 08/22/2019 02/06/2018  WBC 4.0 - 10.5 K/uL 5.3 6.2 10.0  Hemoglobin 12.0 - 15.0 g/dL 13.9 12.9 12.9  Hematocrit 36 - 46 % 43.0 39.0 37.7  Platelets 150 - 400 K/uL 155 186 190   BMP Latest Ref Rng & Units 07/05/2020 08/22/2019 02/08/2018  Glucose 70 - 99 mg/dL 95 102(H) 140(H)  BUN 8 - 23 mg/dL 18 17 10   Creatinine 0.44 - 1.00 mg/dL 0.79 0.84 0.73  Sodium 135 - 145 mmol/L 138 131(L) 129(L)  Potassium 3.5 - 5.1 mmol/L 4.0 2.8(L) 4.3  Chloride 98 - 111 mmol/L 98 92(L) 96(L)  CO2 22 - 32 mmol/L 27 28 23   Calcium 8.9 - 10.3 mg/dL 9.9 9.2 8.3(L)     Summary  24 yof with hypertension, NPH s/p VP shunt placement 2011, and history of TIA in 2017 who presented to [facility] on 07/05/20 for 3d history of acute neurological changes. Primary dx is TIA vs CVA. MRI has been ordered however will need VP shunt  reprogramming after the procedure by neurosurgery and thus has been delayed until the morning (EDP spoke with neurosurgery). Suspect she will be stable for discharge after the MRI if wnl and able to ambulate safely with PT  Assessment & Plan:  Active Problems:   * No active hospital problems. *  TIA vs CVA. Favor TIA as initial symptoms on Saturday had improved and then worsened this morning. ABCD2 score=6. Symptoms not completely consistent with single vascular infarct. Outside the window for tPA.  ddx includes psychogenic vs reconstitution in the setting of ischemic disease. Currently in sinus rhthym but can not rule out embolic source.  Plan --brain MRI in AM followed by VP shunt reprogramming by neurosurgery --echocardiogram to r/o mural thrombus --consider event monitor --continue aspirin, statin --PT/ST evals --meds with sips ok  Chronic medical problems  Hypertension. Continue  losartan-hctz 100-25mg  daily. Psychogenic movement disorder. Continue primidone 50mg  daily, celexa 10mg  daily Peripheral neuropathy. Continue gabapentin.  Best practice:  CODE STATUS: DNR Diet: NPO except sips with meds DVT for prophylaxis: lovenox Social considerations/Family communication: per patient Dispo: Admit patient to Observation with expected length of stay less than 2 midnights.   Mitzi Hansen, MD Internal Medicine Resident PGY-2 [facility] Pager: [phone] 07/06/2020 1:00 AM

## 2020-07-05 NOTE — ED Provider Notes (Signed)
[facility] EMERGENCY DEPARTMENT Provider Note   CSN: [id number] Arrival date & time: 07/05/20  1407     History Chief Complaint  Patient presents with  . Aphasia  . Weakness    Brittany Atkins is a 84 y.o. female.  HPI   Initially she had some trouble on Saturday but it improved.  She felt like her mouth was dry and she had some trouble with her speech however pt states she woke up this morning feeling like her speech was very slurred. It has continued today off and on.  Pt also feels like her right leg is weak.  She is having to drag her right leg.  Her arm feels weak as well.   She saw her doctor today who sent her to the ED for evaluation.  Past Medical History:  Diagnosis Date  . Allergy    seasonal  . Anxiety   . Asthma with allergic rhinitis   . Cataract    surgery  . Cervical radiculopathy   . Chronic kidney disease    ladder is not filling up/ usually incontinent  . Chronic pain   . Diabetes mellitus without complication ([place])   . Fibromyalgia   . GERD (gastroesophageal reflux disease)   . H/O TIA (transient ischemic attack) and stroke   . Headache   . HLD (hyperlipidemia)   . Hypertension   . IBS (irritable bowel syndrome)   . Insomnia   . OSA (obstructive sleep apnea)   . Osteoarthritis   . PAD (peripheral artery disease) ([place])   . Positive PPD   . Sleep apnea    doesn't use her CPAP  . Stroke [facility])    TIA's; MAy 2017  . Thyroid disease   . 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 Acute appendicitis 02/06/2018  . Memory loss 04/21/2016  . Lumbar radiculopathy 02/29/2016  . Diastolic dysfunction 03/50/0938  . History of peptic ulcer disease 02/14/2016  . Chronic GERD 12/08/2015  . Irritable bowel syndrome with constipation 12/08/2015  . Diabetes mellitus type 2 in obese ([place]) 12/08/2015  . Benign essential HTN 08/18/2015  .  Fibromyalgia 08/18/2015  . Obstructive sleep apnea 08/18/2015  . 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 08/18/2014    Past Surgical History:  Procedure Laterality Date  . brain shunt     hydrocephalis  . CATARACT EXTRACTION, BILATERAL  2014  . HIP SURGERY  2009   hip muscle  . HYDROCELE EXCISION / REPAIR  07/2010  . LAPAROSCOPIC ABDOMINAL EXPLORATION N/A 02/07/2018   Procedure: LAPAROSCOPIC ABDOMINAL EXPLORATION;  Surgeon: Alphonsa Overall, MD;  Location: [facility] ORS;  Service: General;  Laterality: N/A;  possible laparoscopic appendectomy, need pathology report  . LUMBAR LAMINECTOMY  2009  . OVARIAN CYST REMOVAL     with  fallopian tube  . TOTAL ABDOMINAL HYSTERECTOMY       OB History   No obstetric history on file.     Family History  Problem Relation Age of Onset  . Hypertension Mother   . Hyperlipidemia Mother   . Heart disease Mother   . Breast cancer Mother   . Diabetes Father   . Hypertension Father   . Stroke Father   . Prostate cancer Father   . Diabetes Sister   . Diabetes Brother  x 2  . Heart attack Maternal Grandmother   . Heart attack Maternal Grandfather   . Thyroid cancer Paternal Grandmother   . Hyperthyroidism Maternal Aunt        x 2    Social History   Tobacco Use  . Smoking status: Never Smoker  . Smokeless tobacco: Never Used  Vaping Use  . Vaping Use: Never used  Substance Use Topics  . Alcohol use: No  . Drug use: No    Home Medications Prior to Admission medications   Medication Sig Start Date End Date Taking? Authorizing Provider  ACYCLOVIR PO Take 1 tablet by mouth daily as needed (fever blisters).    [provider]  albuterol (PROAIR HFA) 108 (90 Base) MCG/ACT inhaler Inhale 2 puffs into the lungs every 6 (six) hours as needed for wheezing or shortness of breath.     [provider]  aspirin 81 MG tablet Take 81 mg by mouth daily.    [provider]  atorvastatin (LIPITOR) 40 MG tablet Take 40 mg by mouth daily.    [provider]  azelastine (OPTIVAR) 0.05 % ophthalmic solution Place 1 drop into both eyes 2 (two) times daily.     [provider]  dicyclomine (BENTYL) 10 MG capsule Take 1-2 capsules (10-20 mg total) by mouth every 8 (eight) hours as needed for spasms. 11/09/16   Yetta Flock, MD  famotidine (PEPCID) 40 MG tablet Take 40 mg by mouth daily.    [provider]  fexofenadine (ALLEGRA) 30 MG tablet Take 30 mg by mouth 2 (two) times daily.    [provider]  fluticasone (FLONASE) 50 MCG/ACT nasal spray Place into both nostrils daily.    [provider]  gabapentin (NEURONTIN) 600 MG tablet Take 600 mg by mouth 3 (three) times daily.     [provider]  hydrochlorothiazide (HYDRODIURIL) 25 MG tablet Take 25 mg by mouth daily.    [provider]  Lactobacillus (ACIDOPHILUS) 100 MG CAPS Take by mouth.    [provider]  loratadine (CLARITIN) 10 MG tablet Take 10 mg by mouth daily.    [provider]  losartan (COZAAR) 50 MG tablet Take 75 mg by mouth daily.    [provider]  Melatonin 5 MG TABS Take 1 tablet by mouth at bedtime.    [provider]  metFORMIN (GLUCOPHAGE) 500 MG tablet Take by mouth 2 (two) times daily with a meal.    [provider]  ondansetron (ZOFRAN ODT) 4 MG disintegrating tablet Take 1 tablet (4 mg total) by mouth every 6 (six) hours as needed for nausea or vomiting. 11/09/16   Armbruster, Carlota Raspberry, MD  potassium chloride SA (KLOR-CON) 20 MEQ tablet Take 1 tablet (20 mEq total) by mouth daily. 08/22/19   Malvin Johns, MD  primidone (MYSOLINE) 50 MG tablet Take 25 mg by mouth at bedtime.     [provider]  Probiotic Product (RISAQUAD-2 PO) Take 2 capsules by mouth daily.    [provider]  traMADol (ULTRAM) 50 MG tablet Take 50 mg by mouth 3 (three) times  daily as needed.    [provider]    Allergies    Cefdinir, Clindamycin/lincomycin, Codeine, Cymbalta [duloxetine hcl], Sulfamethizole, Tuberculin tests, and Oxycodone  Review of Systems   Review of Systems  Constitutional: Negative for fever.  Respiratory: Negative for cough.   Cardiovascular: Negative for chest pain.  Gastrointestinal: Negative for abdominal pain.  All  other systems reviewed and are negative.   Physical Exam Updated Vital Signs BP (!) 173/74 (BP Location: Right Arm)   Pulse (!) 55   Temp 98.6 F (37 C) (Oral)   Resp 19   SpO2 99%   Physical Exam Vitals and nursing note reviewed.  Constitutional:      General: She is not in acute distress.    Appearance: She is well-developed.  HENT:     Head: Normocephalic and atraumatic.     Right Ear: External ear normal.     Left Ear: External ear normal.  Eyes:     General: No scleral icterus.       Right eye: No discharge.        Left eye: No discharge.     Conjunctiva/sclera: Conjunctivae normal.  Neck:     Trachea: No tracheal deviation.  Cardiovascular:     Rate and Rhythm: Normal rate and regular rhythm.  Pulmonary:     Effort: Pulmonary effort is normal. No respiratory distress.     Breath sounds: Normal breath sounds. No stridor. No wheezing or rales.  Abdominal:     General: Bowel sounds are normal. There is no distension.     Palpations: Abdomen is soft.     Tenderness: There is no abdominal tenderness. There is no guarding or rebound.  Musculoskeletal:        General: No tenderness.     Cervical back: Neck supple.  Skin:    General: Skin is warm and dry.     Findings: No rash.  Neurological:     Mental Status: She is alert and oriented to person, place, and time.     Cranial Nerves: No cranial nerve deficit (No facial droop, extraocular movements intact, tongue midline  ).     Sensory: No sensory deficit.     Motor: No abnormal muscle tone or seizure activity.     Coordination:  Coordination normal.     Comments: pronator drift bilateral right upper extrem, unable to hold righ legs off bed for 5 seconds, able to hold it off the bed about 1-2 inches but cannot lift it higher, sensation intact in all extremities, no visual field cuts, no left or right sided neglect, normal finger-nose exam left arm some tremor, no nystagmus noted  Speech in a halting stuttering pattern      ED Results / Procedures / Treatments   Labs (all labs ordered are listed, but only abnormal results are displayed) Labs Reviewed  PROTIME-INR  APTT  ETHANOL  CBC  DIFFERENTIAL  COMPREHENSIVE METABOLIC PANEL  RAPID URINE DRUG SCREEN, HOSP PERFORMED  URINALYSIS, ROUTINE W REFLEX MICROSCOPIC  I-STAT CHEM 8, ED    EKG None  Radiology No results found.  Procedures Procedures (including critical care time)  Medications Ordered in ED Medications - No data to display  ED Course  I have reviewed the triage vital signs and the nursing notes.  Pertinent labs & imaging results that were available during my care of the patient were reviewed by me and considered in my medical decision making (see chart for details).  Clinical Course as of Jul 09 911  Mon Jul 05, 2020  2952 Case discussed with Dr Sandre Kitty.  Pt would benefit from MRI.  If negative no further workup needed.  Pt does have  shunt though.  Per MRI ok to proceed but will need to have shunt series before and after and neurosurgery will need to be involved to re program.   [  JK]  1939 Discussed with MRI team and neurosurgery.  Pt can have her MRI done in the AM.  Neurosurgery will be able to re program the device.  They will need to be notified.   [JK]    Clinical Course User Index [JK] Dorie Rank, MD   MDM Rules/Calculators/A&P                        Pt presented with slurred speech.  Exam not consistent with clear aphasia or dysarthria.  Almost a stuttering type pattern.  Stroke a concern.  Soubt related to her NPH.   ?functional.  CT scan without acute findings.  Plan is for MRI however pt has a VP shunt and will need reprogramming.  Plan is for admission, MRI.   Neuro surg consulted to reprogram shunt.  Pt will be admitted to medical service. Final Clinical Impression(s) / ED Diagnoses Slurred speech  Rx / DC Orders ED Discharge Orders    None       Dorie Rank, MD 07/08/20 [id number]

## 2020-07-05 NOTE — Hospital Course (Signed)
Stroke r/o  Acute neurologic deficit--CT neg. Has VP shunt so MRI being delayed until tomorrow when neurosurg can reprogram shunt afterwards.

## 2020-07-06 ENCOUNTER — Observation Stay (HOSPITAL_COMMUNITY): Payer: Medicare (Managed Care)

## 2020-07-06 ENCOUNTER — Observation Stay (HOSPITAL_BASED_OUTPATIENT_CLINIC_OR_DEPARTMENT_OTHER): Payer: Medicare (Managed Care)

## 2020-07-06 DIAGNOSIS — G459 Transient cerebral ischemic attack, unspecified: Secondary | ICD-10-CM | POA: Diagnosis not present

## 2020-07-06 DIAGNOSIS — R5381 Other malaise: Secondary | ICD-10-CM

## 2020-07-06 DIAGNOSIS — R29818 Other symptoms and signs involving the nervous system: Secondary | ICD-10-CM

## 2020-07-06 DIAGNOSIS — H538 Other visual disturbances: Secondary | ICD-10-CM

## 2020-07-06 DIAGNOSIS — E785 Hyperlipidemia, unspecified: Secondary | ICD-10-CM

## 2020-07-06 DIAGNOSIS — R531 Weakness: Secondary | ICD-10-CM | POA: Diagnosis not present

## 2020-07-06 DIAGNOSIS — I10 Essential (primary) hypertension: Secondary | ICD-10-CM | POA: Diagnosis not present

## 2020-07-06 DIAGNOSIS — G910 Communicating hydrocephalus: Secondary | ICD-10-CM

## 2020-07-06 LAB — LIPID PANEL
Cholesterol: 157 mg/dL (ref 0–200)
HDL: 43 mg/dL (ref >40)
LDL Cholesterol: 93 mg/dL (ref 0–99)
Total CHOL/HDL Ratio: 3.7 RATIO
Triglycerides: 106 mg/dL (ref <150)
VLDL: 21 mg/dL (ref 0–40)

## 2020-07-06 LAB — HEMOGLOBIN A1C
Hgb A1c MFr Bld: 6.4 % — ABNORMAL HIGH (ref 4.8–5.6)
Mean Plasma Glucose: 136.98 mg/dL

## 2020-07-06 LAB — CBC
HCT: 39 % (ref 36.0–46.0)
Hemoglobin: 12.6 g/dL (ref 12.0–15.0)
MCH: 29.9 pg (ref 26.0–34.0)
MCHC: 32.3 g/dL (ref 30.0–36.0)
MCV: 92.6 fL (ref 80.0–100.0)
Platelets: 145 10*3/uL — ABNORMAL LOW (ref 150–400)
RBC: 4.21 MIL/uL (ref 3.87–5.11)
RDW: 13.2 % (ref 11.5–15.5)
WBC: 6.8 10*3/uL (ref 4.0–10.5)
nRBC: 0 % (ref 0.0–0.2)

## 2020-07-06 LAB — ECHOCARDIOGRAM COMPLETE
Area-P 1/2: 3.27 cm2
S' Lateral: 2 cm

## 2020-07-06 LAB — CREATININE, SERUM
Creatinine, Ser: 0.87 mg/dL (ref 0.44–1.00)
GFR, Estimated: >60 mL/min (ref >60)

## 2020-07-06 LAB — GLUCOSE, CAPILLARY: Glucose-Capillary: 80 mg/dL (ref 70–99)

## 2020-07-06 LAB — RESPIRATORY PANEL BY RT PCR (FLU A&B, COVID)
Influenza A by PCR: NEGATIVE
Influenza B by PCR: NEGATIVE
SARS Coronavirus 2 by RT PCR: NEGATIVE

## 2020-07-06 MED ORDER — GABAPENTIN 300 MG PO CAPS
300.0000 mg | ORAL_CAPSULE | Freq: Every evening | ORAL | Status: DC
  Administered 2020-07-06: 300 mg via ORAL
  Filled 2020-07-06: qty 1

## 2020-07-06 MED ORDER — LOSARTAN POTASSIUM 50 MG PO TABS
100.0000 mg | ORAL_TABLET | Freq: Every day | ORAL | Status: DC
  Administered 2020-07-06 – 2020-07-07 (×2): 100 mg via ORAL
  Filled 2020-07-06 (×2): qty 2

## 2020-07-06 MED ORDER — GABAPENTIN 300 MG PO CAPS
600.0000 mg | ORAL_CAPSULE | Freq: Every day | ORAL | Status: DC
  Administered 2020-07-06 – 2020-07-07 (×2): 600 mg via ORAL
  Filled 2020-07-06 (×2): qty 2

## 2020-07-06 MED ORDER — HYDROCHLOROTHIAZIDE 25 MG PO TABS
25.0000 mg | ORAL_TABLET | Freq: Every day | ORAL | Status: DC
  Administered 2020-07-06 – 2020-07-07 (×2): 25 mg via ORAL
  Filled 2020-07-06 (×2): qty 1

## 2020-07-06 NOTE — Progress Notes (Signed)
Carotid study completed.   See CVProc for preliminary results.   Mccauley Diehl, RDMS, RVT 

## 2020-07-06 NOTE — ED Notes (Signed)
Pt is going to MRI.

## 2020-07-06 NOTE — Progress Notes (Signed)
HD#0 Subjective:   Overnight Events: Admitted yesterday.  Patient states she is feeling well this morning. Last MRI here ~1.5 years ago. At that time, was having head pain and pain down the right side of her neck.  Weakness and tingling have improved. Couldn't use R arm yesterday or raise R leg, however can do both today. Also does not feel like R side of face was pulling down.  Had "wet, wobbly, and wacky" symptoms previously. Dr. Jimmye Norman was previous PCP, says to tell her hello.  Discussed obtaining carotid ultrasound and echocardiogram as well as MRI today. Patient agreeable with plan.   Objective:   Vital signs in last 24 hours: Vitals:   07/06/20 0400 07/06/20 0500 07/06/20 0700 07/06/20 0800  BP: 130/60 (!) 131/57 125/73 (!) 154/57  Pulse: (!) 50 (!) 50 (!) 52 63  Resp: 12 13 18  (!) 21  Temp:      TempSrc:      SpO2: 92% 92% 95% 97%   Supplemental O2: Room Air SpO2: 97 %  Physical Exam Physical Exam Vitals and nursing note reviewed.  Constitutional:      General: She is not in acute distress.    Appearance: Normal appearance. She is not ill-appearing, toxic-appearing or diaphoretic.  Cardiovascular:     Rate and Rhythm: Normal rate and regular rhythm.     Pulses: Normal pulses.     Heart sounds: Normal heart sounds. No murmur heard.  No friction rub. No gallop.   Pulmonary:     Effort: Pulmonary effort is normal. No respiratory distress.  Skin:    General: Skin is warm and dry.  Neurological:     General: No focal deficit present.     Mental Status: She is alert and oriented to person, place, and time.     Comments: 3/5 strength right side, improved from night teams exam of 1/5.   Patient with minimal facial droop. Normal sensation  Psychiatric:        Mood and Affect: Mood normal.        Behavior: Behavior normal.      There were no vitals filed for this visit.  No intake or output data in the 24 hours ending 07/06/20 0901 Net IO Since  Admission: No IO data has been entered for this period [07/06/20 0901]  Pertinent Labs: CBC Latest Ref Rng & Units 07/06/2020 07/05/2020 08/22/2019  WBC 4.0 - 10.5 K/uL 6.8 5.3 6.2  Hemoglobin 12.0 - 15.0 g/dL 12.6 13.9 12.9  Hematocrit 36 - 46 % 39.0 43.0 39.0  Platelets 150 - 400 K/uL 145(L) 155 186    CMP Latest Ref Rng & Units 07/06/2020 07/05/2020 08/22/2019  Glucose 70 - 99 mg/dL - 95 102(H)  BUN 8 - 23 mg/dL - 18 17  Creatinine 0.44 - 1.00 mg/dL 0.87 0.79 0.84  Sodium 135 - 145 mmol/L - 138 131(L)  Potassium 3.5 - 5.1 mmol/L - 4.0 2.8(L)  Chloride 98 - 111 mmol/L - 98 92(L)  CO2 22 - 32 mmol/L - 27 28  Calcium 8.9 - 10.3 mg/dL - 9.9 9.2  Total Protein 6.5 - 8.1 g/dL - 7.2 -  Total Bilirubin 0.3 - 1.2 mg/dL - 0.6 -  Alkaline Phos 38 - 126 U/L - 83 -  AST 15 - 41 U/L - 15 -  ALT 0 - 44 U/L - 18 -    Imaging: DG Skull 1-3 Views  Result Date: 07/05/2020 CLINICAL DATA:  Evaluate for possible shunt malfunction  EXAM: SKULL - 1-3 VIEW COMPARISON:  12/17/2019 FINDINGS: Right-sided shunt catheter is again identified and stable. Shunt catheter is visualized is intact. IMPRESSION: Intact shunt catheter in the neck and skull. Electronically Signed   By: Inez Catalina M.D.   On: 07/05/2020 23:40   DG Chest 1 View  Result Date: 07/05/2020 CLINICAL DATA:  Evaluate shunt catheter integrity EXAM: CHEST  1 VIEW COMPARISON:  12/17/2019 FINDINGS: Shunt catheter is well visualized on the right and within normal limits. Cardiac shadow is unremarkable. Lungs are clear. Degenerative changes of the thoracic spine are noted. IMPRESSION: Intact shunt catheter.  No acute abnormality noted. Electronically Signed   By: Inez Catalina M.D.   On: 07/05/2020 23:41   DG Abd 1 View  Result Date: 07/05/2020 CLINICAL DATA:  Evaluation catheter integrity EXAM: ABDOMEN - 1 VIEW COMPARISON:  12/17/2019 FINDINGS: Shunt catheter is noted with the tip deep in the pelvis. The positioning is slightly different than  that seen on the prior exam although no other focal abnormality is noted. Scattered large and small bowel gas are seen. Degenerative changes of the lumbar spine are noted. IMPRESSION: Intact shunt catheter. Electronically Signed   By: Inez Catalina M.D.   On: 07/05/2020 23:42   CT HEAD WO CONTRAST  Result Date: 07/05/2020 CLINICAL DATA:  Neuro deficit, acute, stroke suspected. Aphasia/weakness. EXAM: CT HEAD WITHOUT CONTRAST TECHNIQUE: Contiguous axial images were obtained from the base of the skull through the vertex without intravenous contrast. COMPARISON:  Prior head CT 03/26/2018 FINDINGS: Brain: Stable, mild generalized cerebral atrophy. Unchanged position of a right parietal approach ventricular catheter which terminates within the left lateral ventricle. As before, there is mild encephalomalacia/gliosis surrounding the catheter tract. Unchanged size and configuration of the ventricular system as compared to the prior examination of 03/26/2018. There is no acute intracranial hemorrhage. No demarcated cortical infarct. No extra-axial fluid collection. No evidence of intracranial mass. No midline shift. Vascular: No hyperdense vessel.  Atherosclerotic calcifications. Skull: Right parietal burr hole. No calvarial fracture or focal suspicious osseous lesion. Sinuses/Orbits: Visualized orbits show no acute finding. No significant paranasal sinus disease or mastoid effusion at the imaged levels. IMPRESSION: No evidence of acute intracranial abnormality. Stablea position of a right parietal approach ventricular catheter terminating in the left lateral ventricle. Unchanged size and configuration of the ventricular system as compared to 03/26/2018. Stable mild generalized cerebral atrophy and chronic small vessel ischemic disease. 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 F with a pertinent PMH of hypertension, normal pressure hydrocephalus with VP shunt placed in 2011, and TIA in 2017 who presented with acute weakness and was admitted for possible TIA vs CVA. CT Head negative for acute changes. Patient will need MRI for further evaluated and will be admitted for further workup of her acute neurological changes and neurosurgical consult.   Acute Neurological Changes Patient with acute onset slurred speech, right sided facial numbness right upper and lower extremity weakness. Markedly improved upon examination. No longer complaining of neurological deficits.  Favor TIA as initial symptoms on Saturday had improved and then worsened this morning. ABCD2 score=6. Symptoms not completely consistent with single vascular infarct. Outside the window for tPA upon admission Currently in sinus rhthym but can not rule out embolic source.   --  brain MRI today followed by VP shunt reprogramming by neurosurgery --echocardiogram and carotid ultrasound to rule out possible etiology for possible ischemic event --consider event monitor --continue aspirin, statin --PT/ST evals --meds with sips ok  History of Hypertension Continue home regimen of losartan 100 mg and HCTZ 25 mg  History of HLD Continue home regimen of atorvastatin 40 mg  History of Neuropathy and Pyschogenic movement disorder - Continue home regimen of primidone 50 mg daily, gabapentin 600 mg morning 300 mg evening, and celexa 10 mg daily.  Diet: Carb-Modified IVF: None,None VTE: Enoxaparin Code: DNR PT/OT recs: None, none.  Dispo: Anticipated discharge to Home in 1 days pending Neurosurgery evaluation and reprogramming of shunt.   Please contact the on call pager after 5 pm and on weekends at [phone].  Sanjuana Letters DO PGY-1 Internal Medicine Teaching Service Pager: [phone] 07/06/2020

## 2020-07-06 NOTE — ED Notes (Signed)
[employer] was called. This Probation officer spoke with Brittany Atkins, was told the doctor will call this Probation officer.

## 2020-07-06 NOTE — Evaluation (Addendum)
Physical Therapy Treatment Patient Details Name: Brittany Atkins MRN: [id number] DOB: 24-Jan-1936 Today's Date: 07/06/2020    History of Present Illness Brittany Atkins is an 84 year old female past medical history of hypertension, type 2 diabetes, obstructive sleep apnea, and normal pressure hydrocephalus status post VP shunt placement greater than 3 years ago.She presented to [facility] emergency department on 07/05/2020 for evaluation of slurred speech.  Initial symptoms presented on Saturday with some right-sided facial numbness.  She relates that this somewhat improved however she awoke today with a right-sided facial droop.  She presented to her 88 office for this at which time she developed right upper and lower extremity weakness.  She was instructed to seek evaluation in the emergency department.    PT Comments    Patient received in dark room on stretcher, pleasant. Agrees to PT assessment. She reports headache, but states this is usual for her. Requires min assist with supine><sit.   She is able to transfer with min assist and ambulated in room x 2 lap for a total of 30 feet with RW and min assist. Her initial standing balance is poor with posterior leaning. She will continue to benefit from skilled PT while here to improve activity tolerance and strength.    Follow Up Recommendations  No PT follow up;Other (comment) (patient goes to [facility] program)     Equipment Recommendations  None recommended by PT    Recommendations for Other Services       Precautions / Restrictions Precautions Precautions: Fall Restrictions Weight Bearing Restrictions: No    Mobility  Bed Mobility Overal bed mobility: Needs Assistance Bed Mobility: Supine to Sit;Sit to Supine     Supine to sit: Min assist Sit to supine: Min assist   General bed mobility comments: requires min assist to raise trunk and to bring LEs back up onto stretcher.  Transfers Overall transfer level:  Needs assistance Equipment used: Rolling walker (2 wheeled) Transfers: Sit to/from Stand Sit to Stand: Min assist            Ambulation/Gait Ambulation/Gait assistance: Min guard Gait Distance (Feet): 30 Feet Assistive device: Rolling walker (2 wheeled) Gait Pattern/deviations: Step-through pattern;Decreased stride length;Decreased step length - right;Decreased step length - left;Narrow base of support;Shuffle Gait velocity: decreased   General Gait Details: reliant on RW, posterior lean with initial standing balance   Stairs             Wheelchair Mobility    Modified Rankin (Stroke Patients Only)       Balance Overall balance assessment: Needs assistance Sitting-balance support: Feet supported Sitting balance-Leahy Scale: Good     Standing balance support: Bilateral upper extremity supported;During functional activity Standing balance-Leahy Scale: Poor Standing balance comment: leaning posteriorly with initail standing, actually lifting front of walker off the ground.                            Cognition Arousal/Alertness: Awake/alert Behavior During Therapy: WFL for tasks assessed/performed Overall Cognitive Status: Within Functional Limits for tasks assessed                                        Exercises      General Comments        Pertinent Vitals/Pain Pain Assessment: No/denies pain    Home Living Family/patient expects to be discharged to:: Private residence Living  Arrangements: Spouse/significant other Available Help at Discharge: Family;Available 24 hours/day Type of Home: House Home Access: Level entry   Home Layout: One level        Prior Function Level of Independence: Independent with assistive device(s)      Comments: reports she walks with walker at baseline, drives   PT Goals (current goals can now be found in the care plan section) Acute Rehab PT Goals Patient Stated Goal: to return home PT  Goal Formulation: With patient Time For Goal Achievement: 07/13/20 Potential to Achieve Goals: Good    Frequency    Min 3X/week      PT Plan      Co-evaluation              AM-PAC PT "6 Clicks" Mobility   Outcome Measure  Help needed turning from your back to your side while in a flat bed without using bedrails?: A Little Help needed moving from lying on your back to sitting on the side of a flat bed without using bedrails?: A Little Help needed moving to and from a bed to a chair (including a wheelchair)?: A Little Help needed standing up from a chair using your arms (e.g., wheelchair or bedside chair)?: A Little Help needed to walk in hospital room?: A Little Help needed climbing 3-5 steps with a railing? : A Little 6 Click Score: 18    End of Session Equipment Utilized During Treatment: Gait belt Activity Tolerance: Patient limited by fatigue Patient left: in bed;with call bell/phone within reach Nurse Communication: Mobility status PT Visit Diagnosis: Unsteadiness on feet (R26.81);Muscle weakness (generalized) (M62.81);Difficulty in walking, not elsewhere classified (R26.2)     Time: 1000-1025 PT Time Calculation (min) (ACUTE ONLY): 25 min  Charges:  $Gait Training: 8-22 mins                     Levada Bowersox, PT, GCS 07/06/20,12:04 PM

## 2020-07-06 NOTE — ED Notes (Signed)
Spoke with [employer], Intern regarding the inablilty to get in touch with neurosurgery. Intern states they will speak with their attending and call me back.

## 2020-07-06 NOTE — Consult Note (Signed)
   Providing Compassionate, Quality Care - Together  Neurosurgery Consult  Referring physician: Dr. Heber Baldwyn Reason for referral: VPS reprogramming  Chief Complaint: Slurred speech  History of Present Illness: This is an 84 year old female with a past medical history of hypertension diabetes sleep apnea and NPH status post VP shunt placed in [place] years ago, presents with slurred speech.  She is currently being worked up for strokelike symptoms which have resolved.  At this time she denies any focal weakness, numbness, tingling, speech challenges. She does complain of a slight headache.   Medications: I have reviewed the patient's current medications. Allergies: No Known Allergies  History reviewed. No pertinent family history. Social History:  has no history on file for tobacco use, alcohol use, and drug use.  ROS: 14 point review of systems was obtained in which all pertinent positives and negatives are listed in HPI above  Physical Exam:  Vital signs in last 24 hours: Temp:  [98 F (36.7 C)-98.3 F (36.8 C)] 98 F (36.7 C) (07/25 1814) Pulse Rate:  [58-128] 65 (07/26 0746) Resp:  [11-18] 14 (07/26 0217) BP: (138-182)/(65-125) 153/88 (07/26 0700) SpO2:  [91 %-98 %] 96 % (07/26 0746) PE:  AOx3 PERRLA EOMI Strength 5/5 throughout Right occipital VP shunt depresses and refills briskly SI LT  Impression/Assessment:  84 year old female with  1.  NPH, strata valve set at 1.5  Plan:  -I reprogrammed her strata valve shunt to 1.5 and confirmed its setting -Follow-up as needed -Remainder of care per primary team -We will sign off at this time, please call with questions or concerns  Thank you for allowing me to participate in this patient's care.  Please do not hesitate to call with questions or concerns.   Elwin Sleight, [facility] Cell: [phone]

## 2020-07-06 NOTE — ED Notes (Signed)
Pt has returned from MRI. Pending Shunt reacessed by neurosurgery. Paged admitting for primary RN Alexis.

## 2020-07-06 NOTE — ED Notes (Signed)
Called Neurosurgery on call. Spoke with the office who stated the on call will get in touch with the provider.

## 2020-07-06 NOTE — ED Notes (Addendum)
Spoke with Dr. Isabella Bowens from [place]. He states he will come reset shunt.

## 2020-07-06 NOTE — Progress Notes (Signed)
  Echocardiogram 2D Echocardiogram has been performed.  Brittany Atkins 07/06/2020, 8:57 AM

## 2020-07-06 NOTE — ED Notes (Addendum)
Attempted to call report to 3W 

## 2020-07-06 NOTE — ED Notes (Signed)
MRI has called for pt and requested pt be medicated per notes from MD. MD Tamela Oddi paged , MD called back RN and states MRI will need to be scheduled later in the day, d/t Neurosurgery needing to be available to redo VP Shunt right after. RN will call MRI back to informed them , RN will continue to monitor pt

## 2020-07-06 NOTE — Procedures (Signed)
   Providing Compassionate, Quality Care - Together  DATE OF SERVICE: 07/06/2020  PREOP DIAGNOSIS:  1. NPH  POSTOP DIAGNOSIS: Same  PROCEDURE: 1. Right parieto-occipital VP strata valve shunt reprogramming  SURGEON: Dr. Pieter Partridge C. Camran Keady, DO  ASSISTANT: None  ANESTHESIA: None  EBL: N/A  SPECIMENS: N/A  DRAINS: N/A  COMPLICATIONS: None  CONDITION: Stable  HISTORY: Brittany Atkins is a 84 y.o. female who has a history of NPH status post right VP shunt, strata valve set at 1.5.  She had an MRI of her brain and therefore needed a reprogramming of her shunt.  Shunt series from yesterday shows the strata valve set at 1.5.  PROCEDURE IN DETAIL: Using the [employer] reprogrammer, I adjusted the shunt from 2.5 TO 1.5 which the patient states that is her normal setting, and her pre-MRI imaging confirmed this.  I then confirmed the setting with the reprogrammer to be 1.5.  There were no complicating factors.

## 2020-07-06 NOTE — ED Notes (Signed)
Called report to Quillian Quince, RN on 3W. Report given. Quillian Quince had some questions about if pt to be discharged due to PT evaluating and MRI results back. This RN spoke with IM Resident and states that the plan is for neuro surgery to come and reset shunt and pt will be discharged home. This RN made Kennon Portela aware. Pt being transported to 3W room 18 at this time.

## 2020-07-06 NOTE — ED Notes (Signed)
Spoke with MRI and they will go to MRI and neruo will come after.

## 2020-07-07 DIAGNOSIS — G43909 Migraine, unspecified, not intractable, without status migrainosus: Secondary | ICD-10-CM

## 2020-07-07 DIAGNOSIS — R531 Weakness: Secondary | ICD-10-CM | POA: Diagnosis not present

## 2020-07-07 DIAGNOSIS — I10 Essential (primary) hypertension: Secondary | ICD-10-CM | POA: Diagnosis not present

## 2020-07-07 DIAGNOSIS — R5381 Other malaise: Secondary | ICD-10-CM | POA: Diagnosis not present

## 2020-07-07 DIAGNOSIS — G459 Transient cerebral ischemic attack, unspecified: Secondary | ICD-10-CM | POA: Diagnosis not present

## 2020-07-07 LAB — GLUCOSE, CAPILLARY
Glucose-Capillary: 87 mg/dL (ref 70–99)
Glucose-Capillary: 92 mg/dL (ref 70–99)

## 2020-07-07 MED ORDER — TRAMADOL HCL 50 MG PO TABS
50.0000 mg | ORAL_TABLET | Freq: Once | ORAL | Status: AC
  Administered 2020-07-07: 50 mg via ORAL
  Filled 2020-07-07: qty 1

## 2020-07-07 NOTE — Evaluation (Signed)
Clinical/Bedside Swallow Evaluation Patient Details  Name: Brittany Atkins MRN: [id number] Date of Birth: [birth year]  Today's Date: 07/07/2020 Time: SLP Start Time (ACUTE ONLY): 1024 SLP Stop Time (ACUTE ONLY): 1031 SLP Time Calculation (min) (ACUTE ONLY): 7 min  Past Medical History:  Past Medical History:  Diagnosis Date  . Allergy    seasonal  . Anxiety   . Asthma with allergic rhinitis   . Cataract    surgery  . Cervical radiculopathy   . Chronic kidney disease    ladder is not filling up/ usually incontinent  . Chronic pain   . Diabetes mellitus without complication ([place])   . Fibromyalgia   . GERD (gastroesophageal reflux disease)   . H/O TIA (transient ischemic attack) and stroke   . Headache   . HLD (hyperlipidemia)   . Hypertension   . IBS (irritable bowel syndrome)   . Insomnia   . OSA (obstructive sleep apnea)   . Osteoarthritis   . PAD (peripheral artery disease) ([place])   . Positive PPD   . Sleep apnea    doesn't use her CPAP  . Stroke [facility])    TIA's; MAy 2017  . Thyroid disease   . 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 brain shunt     hydrocephalis  . CATARACT EXTRACTION, BILATERAL  2014  . HIP SURGERY  2009   hip muscle  . HYDROCELE EXCISION / REPAIR  07/2010  . LAPAROSCOPIC ABDOMINAL EXPLORATION N/A 02/07/2018   Procedure: LAPAROSCOPIC ABDOMINAL EXPLORATION;  Surgeon: Alphonsa Overall, MD;  Location: [facility] ORS;  Service: General;  Laterality: N/A;  possible laparoscopic appendectomy, need pathology report  . LUMBAR LAMINECTOMY  2009  . OVARIAN CYST REMOVAL     with  fallopian tube  . TOTAL ABDOMINAL HYSTERECTOMY     HPI:  Brittany Atkins is 84 year old female past medical history of hypertension, type 2 diabetes, obstructive sleep apnea, and normal pressure hydrocephalus status post VP shunt placement greater than 3 years ago.She  presented to [facility] emergency department on 07/05/2020 for evaluation of slurred speech.  Initial symptoms presented on Saturday with some right-sided facial numbness.  She relates that this somewhat improved however she awoke today with a right-sided facial droop.  MRI 10/12 with no acute findings.  VP shunt setting adjusted by neurosurgery   Assessment / Plan / Recommendation Clinical Impression  Pt presents with functional swallowing as asssessed clinically.  Pt tolerated all consistencies trialed with no clinical s/s of aspiration and good oral clearance of solids. Pt notes that her facial droop has resolved and feels that she has returned to baseline.  She notes that the adjustment of VP shunt setting improved symptoms. Pt reports some coughing with liquids, but this was not observed today.  Pt denies hx of pneumonia, suggesting cough is protective.  Pt also states she has increased mucus production and some coughing after meals as well.  These symptoms may indicate GERD.  Pt states she is not on any medications for reflux at present.  Consider GI workup if symptoms persist.  Pt may consider following up with primary physician after discharge.  Pt has no ST needs at this time.  SLP will sign off.    SLP Visit Diagnosis: Dysphagia, unspecified (R13.10)    Aspiration Risk  No limitations    Diet Recommendation Regular;Thin liquid   Liquid Administration via:  Cup;Straw Medication Administration: Whole meds with liquid Supervision: Patient able to self feed Compensations: Slow rate;Small sips/bites Postural Changes: Seated upright at 90 degrees    Other  Recommendations Recommended Consults: Consider GI evaluation;Consider esophageal assessment (possible GERD) Oral Care Recommendations: Oral care BID   Follow up Recommendations None      Frequency and Duration  (N/A)          Prognosis Prognosis for Safe Diet Advancement:  (N/A)      Swallow Study   General Date of Onset:  08/06/20 HPI: Brittany Atkins is 84 year old female past medical history of hypertension, type 2 diabetes, obstructive sleep apnea, and normal pressure hydrocephalus status post VP shunt placement greater than 3 years ago.She presented to [facility] emergency department on 07/05/2020 for evaluation of slurred speech.  Initial symptoms presented on Saturday with some right-sided facial numbness.  She relates that this somewhat improved however she awoke today with a right-sided facial droop.  MRI 10/12 with no acute findings.  VP shunt setting adjusted by neurosurgery Type of Study: Bedside Swallow Evaluation Previous Swallow Assessment: none Diet Prior to this Study: Regular;Thin liquids Temperature Spikes Noted: No Respiratory Status: Room air History of Recent Intubation: No Behavior/Cognition: Alert;Cooperative;Pleasant mood Oral Cavity Assessment: Within Functional Limits Oral Care Completed by SLP: No Oral Cavity - Dentition: Adequate natural dentition Vision: Functional for self-feeding Self-Feeding Abilities: Able to feed self Patient Positioning: Upright in bed Baseline Vocal Quality: Normal Volitional Cough: Strong Volitional Swallow: Able to elicit    Oral/Motor/Sensory Function Overall Oral Motor/Sensory Function: Within functional limits Facial ROM: Within Functional Limits Facial Symmetry: Within Functional Limits Lingual ROM: Within Functional Limits Lingual Symmetry: Within Functional Limits Lingual Strength: Within Functional Limits Velum: Within Functional Limits Mandible: Within Functional Limits   Ice Chips Ice chips: Not tested   Thin Liquid Presentation: Cup;Straw    Nectar Thick Nectar Thick Liquid: Not tested   Honey Thick Honey Thick Liquid: Not tested   Puree Puree: Within functional limits Presentation: Spoon   Solid     Solid: Within functional limits Presentation: [place], [place], [place] Office:  [phone] 07/07/2020,11:28 AM

## 2020-07-07 NOTE — Discharge Instructions (Signed)
Brittany Atkins, thank you for allowing [place] to care for you during your admission. Your neurological symptoms have resolved and I am glad you are feeling better. No active stroke was seen on your imaging and it is suspected that you had a transient ischemic attack (TIA/"mini-stroke") Please follow up with your primary care provider within the next week.

## 2020-07-07 NOTE — Progress Notes (Signed)
HD#0 Subjective:   Overnight Events: No acute events  Evaluated at bedside during rounds this morning. Patient states she is doing well, "like I was never sick." Discussed results of MRI with the patient who expressed understanding. Shared with her that her LDL cholesterol was a little elevated but that we would defer to her primary care physician for management.   Patient shares how sad she was to lose Dr. 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  1300 Patient evaluated at bedside, patient's daughter with questions about patient's hospital course and diagnosis. Explained in detail patient's hospital course and discussed likely cause of symptoms was TIA. Patient's daughter also informed me that patient has had headache since admission. Headache is similar to prior migraines in the past well controlled with tramadol.   Objective:   Vital signs in last 24 hours: Vitals:   07/06/20 2305 07/07/20 0304 07/07/20 0807 07/07/20 1215  BP: (!) 144/84 (!) 157/67 135/61 (!) 135/57  Pulse: 69 65 65 (!) 58  Resp: 18 16 18 18   Temp: 98.4 F (36.9 C) 97.8 F (36.6 C) 98.1 F (36.7 C) 97.9 F (36.6 C)  TempSrc: Oral Oral Oral Oral  SpO2: 91% 95% 94% 92%   Supplemental O2: Room Air SpO2: 92 %  Physical Exam Physical Exam Vitals and nursing note reviewed.  Constitutional:      General: She is not in acute distress.    Appearance: Normal appearance. She is not ill-appearing, toxic-appearing or diaphoretic.  Cardiovascular:     Rate and Rhythm: Normal rate and regular rhythm.     Pulses: Normal pulses.     Heart sounds: Normal heart sounds. No murmur heard.  No friction rub. No gallop.   Pulmonary:     Effort: Pulmonary effort is normal. No respiratory distress.  Skin:    General: Skin is warm and dry.  Neurological:     General: No focal deficit present.     Mental Status: She is alert and oriented to person, place, and time.     Comments:  4/5 strength right side, improved Normal sensation  Psychiatric:        Mood and Affect: Mood normal.        Behavior: Behavior normal.     There were no vitals filed for this visit.   Intake/Output Summary (Last 24 hours) at 07/07/2020 1553 Last data filed at 07/07/2020 0809 Gross per 24 hour  Intake 320 ml  Output 400 ml  Net -80 ml   Net IO Since Admission: -80 mL [07/07/20 1553]  Pertinent Labs: CBC Latest Ref Rng & Units 07/06/2020 07/05/2020 08/22/2019  WBC 4.0 - 10.5 K/uL 6.8 5.3 6.2  Hemoglobin 12.0 - 15.0 g/dL 12.6 13.9 12.9  Hematocrit 36 - 46 % 39.0 43.0 39.0  Platelets 150 - 400 K/uL 145(L) 155 186    CMP Latest Ref Rng & Units 07/06/2020 07/05/2020 08/22/2019  Glucose 70 - 99 mg/dL - 95 102(H)  BUN 8 - 23 mg/dL - 18 17  Creatinine 0.44 - 1.00 mg/dL 0.87 0.79 0.84  Sodium 135 - 145 mmol/L - 138 131(L)  Potassium 3.5 - 5.1 mmol/L - 4.0 2.8(L)  Chloride 98 - 111 mmol/L - 98 92(L)  CO2 22 - 32 mmol/L - 27 28  Calcium 8.9 - 10.3 mg/dL - 9.9 9.2  Total Protein 6.5 - 8.1 g/dL - 7.2 -  Total Bilirubin 0.3 - 1.2 mg/dL - 0.6 -  Alkaline Phos 38 - 126 U/L - 83 -  AST 15 - 41 U/L - 15 -  ALT 0 - 44 U/L - 18 -    Imaging: No results found.  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 F with a pertinent PMH of hypertension, normal pressure hydrocephalus with VP shunt placed in 2011, and TIA in 2017 who presented with acute weakness and was admitted for possible TIA vs CVA. CT Head negative for acute changes. Patient will need MRI for further evaluated and will be admitted for further workup of her acute neurological changes and neurosurgical consult.   Acute Neurological Changes Secondary to Suspected TIA Neurological symptoms resolved.Shunt reset by neurosurgery yesterday evening after MRI. Echocardiogram negative for  thrombus present. Carotid ultrasound with right ICA - 1-39% stenosis, left ICA 1-39% stenosis. Do not suspect patienit had emoboli from cardiac origin or stenosed carotids Discharged home follow up with patient's neurologist.  --CT/MRI Head negative for acute disease, chronic vascular disease noted.  --echocardiogram and carotid ultrasound negative.  --continue aspirin, statin --follow up with neurology on outpatient basis  Acute Migraine Patient with history of migraines, notes pain today is similar to migraines in the past. Patient given one dose tramadol home dose at discharge.  Diet: Carb-Modified IVF: None,None VTE: Enoxaparin Code: DNR PT/OT recs: None, none.  Dispo: Anticipated discharge to Home in 1 days pending Neurosurgery evaluation and reprogramming of shunt.   Please contact the on call pager after 5 pm and on weekends at [phone].  Sanjuana Letters DO PGY-1 Internal Medicine Teaching Service Pager: [phone] 07/07/2020

## 2020-07-08 NOTE — Discharge Summary (Signed)
Name: Brittany Atkins MRN: [id number] DOB: 1935-09-26 84 y.o. PCP: Patient, No Pcp Per  Date of Admission: 07/05/2020  2:12 PM Date of Discharge: 07/07/2020 Attending Physician: Dr. 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   Acidophilus Caps capsule Take 2 capsules by mouth daily.   acyclovir 400 MG tablet Commonly known as: ZOVIRAX Take 400 mg by mouth 5 (five) times daily as needed (fever blisters).   Aspercreme Lidocaine 4 % Crea Generic drug: Lidocaine HCl Apply 1 application topically 3 (three) times daily as needed (pain).   aspirin 81 MG tablet Take 81 mg by mouth daily.   atorvastatin 40 MG tablet Commonly known as: LIPITOR Take 40 mg by mouth daily.   BIOTENE MOISTURIZING MOUTH MT Use as directed 1 spray in the mouth or throat every 4 (four) hours as needed (dry mouth).   cetirizine 10 MG tablet Commonly known as: ZYRTEC Take 10 mg by mouth daily.   citalopram 10 MG tablet Commonly known as: CELEXA Take 10 mg by mouth daily.   dicyclomine 10 MG capsule Commonly known as: BENTYL Take 1-2 capsules (10-20 mg total) by mouth every 8 (eight) hours as needed for spasms. What changed: when to take this   fluticasone 50 MCG/ACT nasal spray Commonly known as: FLONASE Place into both nostrils daily.   gabapentin 300 MG capsule Commonly known as: NEURONTIN Take 300-600 mg by mouth 2 (two) times daily. Take 2 capsules (600 mg)  in the morning and Take 1 capsule (300 mg) in the evening   guaiFENesin 100 MG/5ML liquid Commonly known as: ROBITUSSIN Take 200 mg by mouth 3 (three) times daily as needed for cough.   ketotifen 0.025 % ophthalmic solution Commonly known as: ZADITOR Place 1 drop into both eyes every 8 (eight) hours as needed (irritation).   Lactaid Fast Act 9000 units Tabs Generic drug: Lactase Take 1 tablet by mouth 3 (three) times daily as needed (lactose intolerance).   losartan-hydrochlorothiazide 100-25 MG tablet Commonly known as: HYZAAR Take 1 tablet by mouth every evening.   Melatonin 10 MG Caps Take 1 capsule by mouth at bedtime.   nystatin powder Generic drug: nystatin Apply 1 application topically 3 (three) times daily as needed (skin irritation).   ondansetron 4 MG disintegrating tablet Commonly known as: Zofran ODT Take 1 tablet (4 mg total) by mouth every 6 (six) hours as needed for nausea or vomiting.   potassium chloride SA 20 MEQ tablet Commonly known as: KLOR-CON Take 1 tablet (20 mEq total) by mouth daily.   primidone 50 MG tablet Commonly known as: MYSOLINE Take 50 mg by mouth at bedtime.   sennosides-docusate sodium 8.6-50 MG tablet Commonly known as: SENOKOT-S Take 2 tablets by mouth 2 (two) times daily as needed for constipation.   traMADol 50 MG tablet Commonly known as: ULTRAM Take 50 mg by mouth 3 (three) times daily  as needed for moderate pain.       Disposition and follow-up:   [name] was discharged from [facility] in Good condition.  At the hospital follow up visit please address:  1.  Follow-up:  A. Primary care provider, continue high intensity statin and aspirin    B. Follow up neurologist  2.  Labs / imaging needed at time of follow-up: None  3.  Pending labs/ test needing follow-up: None  Follow-up Appointments:  Follow-up Information    Dawley, Troy C, DO Follow up in 1 week(s).   Why: make an  appointment as needed Contact information: [address] [place] [address] [phone]               Hospital Course by problem list:  Transient Weakness, Suspected Transient Ischemic Attack  Patient presented with acute weakness, right sided facial numbness, intermittent blurry vision, and change in speech was admitted for possible TIA vs CVA. Initial examination patient had 1/5 RUE/RLE weakness. CT Head negative for acute changes. Carotid ultrasound as well as echocardiogram were performed, did Patient had MRI performed that was negative for acute changes. The patient's shunt was reset by neurosurgery. The patient became less weak during her hospitalization and at time of discharge, had 4/5 strength in the right upper and lower extremity. All of her other neurological symptoms resolved as well. She progressed well and will follow up with her primary care provider and neurologist.   Discharge Vitals:   BP (!) 135/57 (BP Location: Left Arm)   Pulse (!) 58   Temp 97.9 F (36.6 C) (Oral)   Resp 18   SpO2 92%   Pertinent Labs, Studies, and Procedures:  CBC Latest Ref Rng & Units 07/06/2020 07/05/2020 08/22/2019  WBC 4.0 - 10.5 K/uL 6.8 5.3 6.2  Hemoglobin 12.0 - 15.0 g/dL 12.6 13.9 12.9  Hematocrit 36 - 46 % 39.0 43.0 39.0  Platelets 150 - 400 K/uL 145(L) 155 186    CMP Latest Ref Rng & Units 07/06/2020 07/05/2020 08/22/2019  Glucose 70 - 99 mg/dL - 95 102(H)  BUN 8 - 23 mg/dL - 18 17  Creatinine 0.44 - 1.00 mg/dL 0.87 0.79 0.84  Sodium 135 - 145 mmol/L - 138 131(L)  Potassium 3.5 - 5.1 mmol/L - 4.0 2.8(L)  Chloride 98 - 111 mmol/L - 98 92(L)  CO2 22 - 32 mmol/L - 27 28  Calcium 8.9 - 10.3 mg/dL - 9.9 9.2  Total Protein 6.5 - 8.1 g/dL - 7.2 -  Total Bilirubin 0.3 - 1.2 mg/dL - 0.6 -  Alkaline Phos 38 - 126 U/L - 83 -  AST 15 - 41 U/L - 15 -  ALT 0 - 44 U/L - 18 -    DG Skull 1-3 Views  Result Date: 07/05/2020 CLINICAL DATA:  Evaluate for possible  shunt malfunction EXAM: SKULL - 1-3 VIEW COMPARISON:  12/17/2019 FINDINGS: Right-sided shunt catheter is again identified and stable. Shunt catheter is visualized is intact. IMPRESSION: Intact shunt catheter in the neck and skull. Electronically Signed   By: Inez Catalina M.D.   On: 07/05/2020 23:40   DG Chest 1 View  Result Date: 07/05/2020 CLINICAL DATA:  Evaluate shunt catheter integrity EXAM: CHEST  1 VIEW COMPARISON:  12/17/2019 FINDINGS: Shunt catheter is well visualized on the right and within normal limits. Cardiac shadow is unremarkable. Lungs are clear. Degenerative changes of the thoracic spine are noted. IMPRESSION: Intact shunt catheter.  No acute  abnormality noted. Electronically Signed   By: Inez Catalina M.D.   On: 07/05/2020 23:41   DG Abd 1 View  Result Date: 07/05/2020 CLINICAL DATA:  Evaluation catheter integrity EXAM: ABDOMEN - 1 VIEW COMPARISON:  12/17/2019 FINDINGS: Shunt catheter is noted with the tip deep in the pelvis. The positioning is slightly different than that seen on the prior exam although no other focal abnormality is noted. Scattered large and small bowel gas are seen. Degenerative changes of the lumbar spine are noted. IMPRESSION: Intact shunt catheter. Electronically Signed   By: Inez Catalina M.D.   On: 07/05/2020 23:42   CT HEAD WO CONTRAST  Result Date: 07/05/2020 CLINICAL DATA:  Neuro deficit, acute, stroke suspected. Aphasia/weakness. EXAM: CT HEAD WITHOUT CONTRAST TECHNIQUE: Contiguous axial images were obtained from the base of the skull through the vertex without intravenous contrast. COMPARISON:  Prior head CT 03/26/2018 FINDINGS: Brain: Stable, mild generalized cerebral atrophy. Unchanged position of a right parietal approach ventricular catheter which terminates within the left lateral ventricle. As before, there is mild encephalomalacia/gliosis surrounding the catheter tract. Unchanged size and configuration of the ventricular system as compared to the  prior examination of 03/26/2018. There is no acute intracranial hemorrhage. No demarcated cortical infarct. No extra-axial fluid collection. No evidence of intracranial mass. No midline shift. Vascular: No hyperdense vessel.  Atherosclerotic calcifications. Skull: Right parietal burr hole. No calvarial fracture or focal suspicious osseous lesion. Sinuses/Orbits: Visualized orbits show no acute finding. No significant paranasal sinus disease or mastoid effusion at the imaged levels. IMPRESSION: No evidence of acute intracranial abnormality. Stablea position of a right parietal approach ventricular catheter terminating in the left lateral ventricle. Unchanged size and configuration of the ventricular system as compared to 03/26/2018. Stable mild generalized cerebral atrophy and chronic small vessel ischemic disease. Electronically Signed   By: Kellie Simmering DO   On: 07/05/2020 15:14   MR BRAIN WO CONTRAST  Result Date: 07/06/2020 CLINICAL DATA:  Slurred speech and left-sided weakness EXAM: MRI HEAD WITHOUT CONTRAST TECHNIQUE: Multiplanar, multiecho pulse sequences of the brain and surrounding structures were obtained without intravenous contrast. COMPARISON:  2017 FINDINGS: Susceptibility artifact from shunt apparatus and the right parieto-occipital region. Below findings within this limitation. Brain: Right parietal approach shunt catheter traverses midline with tip in the body of the left lateral ventricle. Ventricle caliber has mildly increased since 2017. However, there may not be a change since more recent 2019 head CT. There is no acute infarction or intracranial hemorrhage. Patchy and confluent areas T2 hyperintensity in the supratentorial white matter are nonspecific but probably reflect moderate chronic microvascular ischemic changes. Unchanged encephalomalacia/gliosis adjacent to the catheter tract. There is no intracranial mass or mass effect. No extra-axial fluid collection. Vascular: Major vessel flow  voids at the skull base are preserved. Skull and upper cervical spine: Normal marrow signal is preserved. Sinuses/Orbits: Paranasal sinuses are aerated. Bilateral lens replacements. Other: Sella is unremarkable.  Mastoid air cells are clear. IMPRESSION: No evidence of recent infarction, hemorrhage mass. Shunt catheter remains present. Ventricles have increased in size since 2017 MRI but are probably similar to more recent 2019 head CT. Moderate chronic microvascular ischemic changes. Electronically Signed   By: Macy Mis M.D.   On: 07/06/2020 15:47   ECHOCARDIOGRAM COMPLETE  Result Date: 07/06/2020    ECHOCARDIOGRAM REPORT   Patient Name:   HOSANNA BETLEY Date of Exam: 07/06/2020 Medical Rec #:  [id number]  Height:       60.0 in Accession #:    [id number]               Weight:       166.0 lb Date of Birth:  10-Jan-1936                 BSA:          1.724 m Patient Age:    20 years                 BP:           154/57 mmHg Patient Gender: F                        HR:           63 bpm. Exam Location:  Inpatient Procedure: 2D Echo, Cardiac Doppler and Color Doppler Indications:    TIA  History:        Patient has no prior history of Echocardiogram examinations.                 TIA, Stroke and PAD; Risk Factors:Hypertension, Dyslipidemia and                 Diabetes. CKD.  Sonographer:    Dustin Flock Referring Phys: [place]  1. Left ventricular ejection fraction, by estimation, is 60 to 65%. The left ventricle has normal function. The left ventricle has no regional wall motion abnormalities. Diastolic function is indeterminate due to marked bradycardia.  2. Right ventricular systolic function is normal. The right ventricular size is normal. There is normal pulmonary artery systolic pressure. The estimated right ventricular systolic pressure is 19.5 mmHg.  3. The mitral valve is normal in structure. Trivial mitral valve regurgitation. No evidence of mitral  stenosis.  4. The aortic valve is normal in structure. Aortic valve regurgitation is not visualized. Mild aortic valve sclerosis is present, with no evidence of aortic valve stenosis.  5. The inferior vena cava is normal in size with greater than 50% respiratory variability, suggesting right atrial pressure of 3 mmHg. FINDINGS  Left Ventricle: Left ventricular ejection fraction, by estimation, is 60 to 65%. The left ventricle has normal function. The left ventricle has no regional wall motion abnormalities. The left ventricular internal cavity size was normal in size. There is  no left ventricular hypertrophy. Diastolic function is indeterminate due to marked bradycardia. Right Ventricle: The right ventricular size is normal. No increase in right ventricular wall thickness. Right ventricular systolic function is normal. There is normal pulmonary artery systolic pressure. The tricuspid regurgitant velocity is 2.19 m/s, and  with an assumed right atrial pressure of 3 mmHg, the estimated right ventricular systolic pressure is 09.3 mmHg. Left Atrium: Left atrial size was normal in size. Right Atrium: Right atrial size was normal in size. Pericardium: There is no evidence of pericardial effusion. Mitral Valve: The mitral valve is normal in structure. Mild to moderate mitral annular calcification. Trivial mitral valve regurgitation. No evidence of mitral valve stenosis. Tricuspid Valve: The tricuspid valve is normal in structure. Tricuspid valve regurgitation is trivial. No evidence of tricuspid stenosis. Aortic Valve: The aortic valve is normal in structure. Aortic valve regurgitation is not visualized. Mild aortic valve sclerosis is present, with no evidence of aortic valve stenosis. Pulmonic Valve: The pulmonic valve was normal in structure. Pulmonic valve regurgitation is not visualized. No evidence of pulmonic stenosis. Aorta: The  aortic root is normal in size and structure. Venous: The inferior vena cava is normal  in size with greater than 50% respiratory variability, suggesting right atrial pressure of 3 mmHg. IAS/Shunts: No atrial level shunt detected by color flow Doppler.  LEFT VENTRICLE PLAX 2D LVIDd:         3.50 cm  Diastology LVIDs:         2.00 cm  LV e' medial:    5.77 cm/s LV PW:         1.10 cm  LV E/e' medial:  15.8 LV IVS:        1.20 cm  LV e' lateral:   7.40 cm/s LVOT diam:     1.90 cm  LV E/e' lateral: 12.3 LV SV:         83 LV SV Index:   48 LVOT Area:     2.84 cm  RIGHT VENTRICLE RV Basal diam:  2.60 cm RV S prime:     15.00 cm/s TAPSE (M-mode): 3.0 cm LEFT ATRIUM             Index       RIGHT ATRIUM           Index LA diam:        3.30 cm 1.91 cm/m  RA Area:     11.20 cm LA Vol (A2C):   39.3 ml 22.79 ml/m RA Volume:   24.50 ml  14.21 ml/m LA Vol (A4C):   24.5 ml 14.21 ml/m LA Biplane Vol: 32.2 ml 18.67 ml/m  AORTIC VALVE LVOT Vmax:   114.00 cm/s LVOT Vmean:  84.100 cm/s LVOT VTI:    0.292 m  AORTA Ao Root diam: 2.70 cm MITRAL VALVE               TRICUSPID VALVE MV Area (PHT): 3.27 cm    TR Peak grad:   19.2 mmHg MV Decel Time: 232 msec    TR Vmax:        219.00 cm/s MV E velocity: 91.30 cm/s MV A velocity: 91.90 cm/s  SHUNTS MV E/A ratio:  0.99        Systemic VTI:  0.29 m                            Systemic Diam: 1.90 cm Dani Gobble Croitoru MD Electronically signed by Sanda Klein MD Signature Date/Time: 07/06/2020/10:24:03 AM    Final    VAS US CAROTID  Result Date: 07/06/2020 Carotid Arterial Duplex Study Indications:       TIA and Syncope. Risk Factors:      Hypertension, hyperlipidemia, PAD. Comparison Study:  None Performing Technologist: Griffin Basil RCT RDMS  Examination Guidelines: A complete evaluation includes B-mode imaging, spectral Doppler, color Doppler, and power Doppler as needed of all accessible portions of each vessel. Bilateral testing is considered an integral part of a complete examination. Limited examinations for reoccurring indications may be performed as noted.   Right Carotid Findings: +----------+--------+--------+--------+------------------+------------------+           PSV cm/sEDV cm/sStenosisPlaque DescriptionComments           +----------+--------+--------+--------+------------------+------------------+ CCA Prox  96      22                                                   +----------+--------+--------+--------+------------------+------------------+  CCA Distal80      19                                intimal thickening +----------+--------+--------+--------+------------------+------------------+ ICA Prox  119     24      1-39%                     intimal thickening +----------+--------+--------+--------+------------------+------------------+ ICA Distal111     28                                                   +----------+--------+--------+--------+------------------+------------------+ ECA       81      0                                                    +----------+--------+--------+--------+------------------+------------------+ +----------+--------+-------+--------+-------------------+           PSV cm/sEDV cmsDescribeArm Pressure (mmHG) +----------+--------+-------+--------+-------------------+ [id number]     4                                  +----------+--------+-------+--------+-------------------+ +---------+--------+--+--------+--+---------+ VertebralPSV cm/s57EDV cm/s16Antegrade +---------+--------+--+--------+--+---------+  Left Carotid Findings: +----------+--------+--------+--------+------------------+------------------+           PSV cm/sEDV cm/sStenosisPlaque DescriptionComments           +----------+--------+--------+--------+------------------+------------------+ CCA Prox  82      18                                                   +----------+--------+--------+--------+------------------+------------------+ CCA Distal100     18                                 intimal thickening +----------+--------+--------+--------+------------------+------------------+ ICA Prox  111     33      1-39%                     intimal thickening +----------+--------+--------+--------+------------------+------------------+ ICA Distal117     34                                                   +----------+--------+--------+--------+------------------+------------------+ ECA       104     3                                                    +----------+--------+--------+--------+------------------+------------------+ +----------+--------+--------+--------+-------------------+           PSV cm/sEDV cm/sDescribeArm Pressure (mmHG) +----------+--------+--------+--------+-------------------+ [id number]     4                                   +----------+--------+--------+--------+-------------------+ +---------+--------+--+--------+--+---------+  VertebralPSV cm/s59EDV cm/s17Antegrade +---------+--------+--+--------+--+---------+   Summary: Right Carotid: Velocities in the right ICA are consistent with a 1-39% stenosis. Left Carotid: Velocities in the left ICA are consistent with a 1-39% stenosis. Vertebrals: Bilateral vertebral arteries demonstrate antegrade flow. *See table(s) above for measurements and observations.  Electronically signed by Deitra Mayo MD on 07/06/2020 at 2:17:16 PM.    Final      Discharge Instructions: Discharge Instructions    Call MD for:  difficulty breathing, headache or visual disturbances   Complete by: As directed    Call MD for:  persistant dizziness or light-headedness   Complete by: As directed    Call MD for:  persistant nausea and vomiting   Complete by: As directed    Call MD for:  redness, tenderness, or signs of infection (pain, swelling, redness, odor or green/yellow discharge around incision site)   Complete by: As directed    Call MD for:  severe uncontrolled pain   Complete by: As directed     Call MD for:  temperature >100.4   Complete by: As directed    Diet - low sodium heart healthy   Complete by: As directed    Increase activity slowly   Complete by: As directed      Signed: Riesa Pope, MD 07/08/2020, 10:39 AM   Pager: [phone]

## 2021-03-16 ENCOUNTER — Other Ambulatory Visit: Payer: Self-pay | Admitting: Family Medicine

## 2021-03-17 ENCOUNTER — Other Ambulatory Visit: Payer: Self-pay | Admitting: Family Medicine

## 2021-03-17 ENCOUNTER — Other Ambulatory Visit: Payer: Self-pay | Admitting: Vascular Surgery

## 2021-03-17 DIAGNOSIS — N200 Calculus of kidney: Secondary | ICD-10-CM

## 2021-04-06 ENCOUNTER — Other Ambulatory Visit: Payer: Medicare (Managed Care)

## 2021-05-23 ENCOUNTER — Other Ambulatory Visit: Payer: Medicare (Managed Care)

## 2021-06-09 ENCOUNTER — Ambulatory Visit
Admission: RE | Admit: 2021-06-09 | Discharge: 2021-06-09 | Disposition: A | Discharge status: Home / Self Care | Payer: Medicare (Managed Care) | Source: Ambulatory Visit | Attending: Vascular Surgery | Admitting: Vascular Surgery

## 2021-06-09 ENCOUNTER — Other Ambulatory Visit: Payer: Self-pay

## 2021-06-09 DIAGNOSIS — N200 Calculus of kidney: Secondary | ICD-10-CM

## 2021-12-04 ENCOUNTER — Emergency Department (HOSPITAL_BASED_OUTPATIENT_CLINIC_OR_DEPARTMENT_OTHER): Payer: Medicare (Managed Care)

## 2021-12-04 ENCOUNTER — Other Ambulatory Visit: Payer: Self-pay

## 2021-12-04 ENCOUNTER — Emergency Department (HOSPITAL_BASED_OUTPATIENT_CLINIC_OR_DEPARTMENT_OTHER)
Admission: EM | Admit: 2021-12-04 | Discharge: 2021-12-04 | Disposition: A | Discharge status: Home / Self Care | Payer: Medicare (Managed Care) | Attending: Emergency Medicine | Admitting: Emergency Medicine

## 2021-12-04 ENCOUNTER — Encounter (HOSPITAL_BASED_OUTPATIENT_CLINIC_OR_DEPARTMENT_OTHER): Payer: Self-pay

## 2021-12-04 DIAGNOSIS — R519 Headache, unspecified: Secondary | ICD-10-CM | POA: Insufficient documentation

## 2021-12-04 DIAGNOSIS — N189 Chronic kidney disease, unspecified: Secondary | ICD-10-CM | POA: Insufficient documentation

## 2021-12-04 DIAGNOSIS — R110 Nausea: Secondary | ICD-10-CM | POA: Diagnosis not present

## 2021-12-04 DIAGNOSIS — Z79899 Other long term (current) drug therapy: Secondary | ICD-10-CM | POA: Insufficient documentation

## 2021-12-04 DIAGNOSIS — E1122 Type 2 diabetes mellitus with diabetic chronic kidney disease: Secondary | ICD-10-CM | POA: Insufficient documentation

## 2021-12-04 DIAGNOSIS — Z20822 Contact with and (suspected) exposure to covid-19: Secondary | ICD-10-CM | POA: Diagnosis not present

## 2021-12-04 DIAGNOSIS — I129 Hypertensive chronic kidney disease with stage 1 through stage 4 chronic kidney disease, or unspecified chronic kidney disease: Secondary | ICD-10-CM | POA: Diagnosis not present

## 2021-12-04 DIAGNOSIS — Z982 Presence of cerebrospinal fluid drainage device: Secondary | ICD-10-CM | POA: Insufficient documentation

## 2021-12-04 DIAGNOSIS — J45909 Unspecified asthma, uncomplicated: Secondary | ICD-10-CM | POA: Diagnosis not present

## 2021-12-04 DIAGNOSIS — R1084 Generalized abdominal pain: Secondary | ICD-10-CM | POA: Diagnosis present

## 2021-12-04 DIAGNOSIS — N390 Urinary tract infection, site not specified: Secondary | ICD-10-CM | POA: Diagnosis not present

## 2021-12-04 DIAGNOSIS — Z7982 Long term (current) use of aspirin: Secondary | ICD-10-CM | POA: Diagnosis not present

## 2021-12-04 LAB — URINALYSIS, ROUTINE W REFLEX MICROSCOPIC
Bilirubin Urine: NEGATIVE
Glucose, UA: 100 mg/dL — AB
Hgb urine dipstick: NEGATIVE
Ketones, ur: NEGATIVE mg/dL
Leukocytes,Ua: NEGATIVE
Nitrite: POSITIVE — AB
Protein, ur: 30 mg/dL — AB
Specific Gravity, Urine: 1.015 (ref 1.005–1.030)
pH: 5 (ref 5.0–8.0)

## 2021-12-04 LAB — CBC WITH DIFFERENTIAL/PLATELET
Abs Immature Granulocytes: 0.02 10*3/uL (ref 0.00–0.07)
Basophils Absolute: 0.1 10*3/uL (ref 0.0–0.1)
Basophils Relative: 1 %
Eosinophils Absolute: 0.2 10*3/uL (ref 0.0–0.5)
Eosinophils Relative: 3 %
HCT: 39.3 % (ref 36.0–46.0)
Hemoglobin: 12.9 g/dL (ref 12.0–15.0)
Immature Granulocytes: 0 %
Lymphocytes Relative: 26 %
Lymphs Abs: 1.6 10*3/uL (ref 0.7–4.0)
MCH: 29.3 pg (ref 26.0–34.0)
MCHC: 32.8 g/dL (ref 30.0–36.0)
MCV: 89.3 fL (ref 80.0–100.0)
Monocytes Absolute: 0.5 10*3/uL (ref 0.1–1.0)
Monocytes Relative: 8 %
Neutro Abs: 4 10*3/uL (ref 1.7–7.7)
Neutrophils Relative %: 62 %
Platelets: 193 10*3/uL (ref 150–400)
RBC: 4.4 MIL/uL (ref 3.87–5.11)
RDW: 13.2 % (ref 11.5–15.5)
WBC: 6.4 10*3/uL (ref 4.0–10.5)
nRBC: 0 % (ref 0.0–0.2)

## 2021-12-04 LAB — COMPREHENSIVE METABOLIC PANEL
ALT: 17 U/L (ref 0–44)
AST: 17 U/L (ref 15–41)
Albumin: 4.2 g/dL (ref 3.5–5.0)
Alkaline Phosphatase: 85 U/L (ref 38–126)
Anion gap: 8 (ref 5–15)
BUN: 23 mg/dL (ref 8–23)
CO2: 27 mmol/L (ref 22–32)
Calcium: 9.3 mg/dL (ref 8.9–10.3)
Chloride: 103 mmol/L (ref 98–111)
Creatinine, Ser: 0.82 mg/dL (ref 0.44–1.00)
GFR, Estimated: >60 mL/min (ref >60)
Glucose, Bld: 112 mg/dL — ABNORMAL HIGH (ref 70–99)
Potassium: 3.9 mmol/L (ref 3.5–5.1)
Sodium: 138 mmol/L (ref 135–145)
Total Bilirubin: 0.5 mg/dL (ref 0.3–1.2)
Total Protein: 7.4 g/dL (ref 6.5–8.1)

## 2021-12-04 LAB — RESP PANEL BY RT-PCR (FLU A&B, COVID) ARPGX2
Influenza A by PCR: NEGATIVE
Influenza B by PCR: NEGATIVE
SARS Coronavirus 2 by RT PCR: NEGATIVE

## 2021-12-04 LAB — URINALYSIS, MICROSCOPIC (REFLEX)

## 2021-12-04 LAB — TROPONIN I (HIGH SENSITIVITY)
Troponin I (High Sensitivity): 6 ng/L (ref <18)
Troponin I (High Sensitivity): 7 ng/L (ref <18)

## 2021-12-04 LAB — LIPASE, BLOOD: Lipase: 50 U/L (ref 11–51)

## 2021-12-04 MED ORDER — SODIUM CHLORIDE 0.9 % IV BOLUS
1000.0000 mL | Freq: Once | INTRAVENOUS | Status: AC
  Administered 2021-12-04: 1000 mL via INTRAVENOUS

## 2021-12-04 MED ORDER — LIDOCAINE VISCOUS HCL 2 % MT SOLN
15.0000 mL | Freq: Once | OROMUCOSAL | Status: AC
Start: 2021-12-04 — End: 2021-12-04
  Administered 2021-12-04: 15 mL via ORAL
  Filled 2021-12-04: qty 15

## 2021-12-04 MED ORDER — FOSFOMYCIN TROMETHAMINE 3 G PO PACK
3.0000 g | PACK | Freq: Once | ORAL | Status: AC
  Administered 2021-12-04: 3 g via ORAL
  Filled 2021-12-04: qty 3

## 2021-12-04 MED ORDER — FAMOTIDINE IN NACL 20-0.9 MG/50ML-% IV SOLN
20.0000 mg | Freq: Once | INTRAVENOUS | Status: AC
  Administered 2021-12-04: 20 mg via INTRAVENOUS
  Filled 2021-12-04: qty 50

## 2021-12-04 MED ORDER — ALUM & MAG HYDROXIDE-SIMETH 200-200-20 MG/5ML PO SUSP
30.0000 mL | Freq: Once | ORAL | Status: AC
  Administered 2021-12-04: 30 mL via ORAL
  Filled 2021-12-04: qty 30

## 2021-12-04 MED ORDER — ONDANSETRON HCL 4 MG/2ML IJ SOLN
4.0000 mg | Freq: Once | INTRAMUSCULAR | Status: AC
  Administered 2021-12-04: 4 mg via INTRAVENOUS
  Filled 2021-12-04: qty 2

## 2021-12-04 MED ORDER — MORPHINE SULFATE (PF) 2 MG/ML IV SOLN
2.0000 mg | Freq: Once | INTRAVENOUS | Status: AC
Start: 2021-12-04 — End: 2021-12-04
  Administered 2021-12-04: 2 mg via INTRAVENOUS
  Filled 2021-12-04: qty 1

## 2021-12-04 MED ORDER — IOHEXOL 300 MG/ML  SOLN
100.0000 mL | Freq: Once | INTRAMUSCULAR | Status: AC | PRN
  Administered 2021-12-04: 100 mL via INTRAVENOUS

## 2021-12-04 NOTE — Discharge Instructions (Addendum)
Please consider resuming your home medications ? ?You are given a one-time antibiotic to treat your ongoing infection in the emergency department. ? ?It was a pleasure caring for you today in the emergency department. ? ?Please return to the emergency department for any worsening or worrisome symptoms. ? ?

## 2021-12-04 NOTE — ED Triage Notes (Signed)
Patient presents with complaint of abdominal pain and L flank pain.  She states she had a UTI last week and finished regimen of Cipro on Friday.  Pain has gotten progressively worse over last 4 hours.  Patient states she has also had constipation and intermittent nausea over the last week.  Around 0200 she took tylenol and [place] poppy seed for pain.  Patient also has pmh of hypertension, and was previously taking losartan but she states she is trying to transition to more holistic medicine.  Not currently taking the losartan. ?

## 2021-12-04 NOTE — ED Notes (Signed)
ED Provider at bedside. 

## 2021-12-04 NOTE — ED Notes (Signed)
First contact with patient. Pt. denies shob, is A&OX 4, resp. even/unlabored. Pt on cardiac monitor, call light within reach. Patient updated on plan of care. Will continue to monitor patient.   ?

## 2021-12-04 NOTE — ED Notes (Signed)
Pt ambulatory, d/c to lobby awaiting cab. Cab voucher with security ?

## 2021-12-04 NOTE — ED Notes (Signed)
Patient transported to X-ray & CT °

## 2021-12-04 NOTE — ED Notes (Signed)
Pt ambulated with stand by assist to restroom 

## 2021-12-04 NOTE — ED Provider Notes (Signed)
[facility]PART[facility] Provider Note   CSN: [id number] Arrival date & time: 12/04/21  6761     History  Chief Complaint  Patient presents with   Flank Pain    left    Brittany Atkins is a 86 y.o. female.  This is a 86 y.o. fem  with significant medical history as below, including OSA, HLD, HTN, TIA, CVA, CKD, Thyroid disease who presents to the ED with complaint of flank pain, nausea, ?UTI. Pt reports was recently treated for UTI w/ CIPRO. Pain/nausea worsened last few hours, taking tylenol and ?poppy seed for pain control. Stopped taking anti-hypertensive because she felt it wasn't helping.  She is taking her daily aspirin, 325 mg.  Hx VP shunt while in [place], last f/u around 2 yrs ago.  Patient does report headache, chronic difficulty with her balance.  Nausea without vomiting. Occ blurry vision last 1-2 weeks, chronic problem for her, No vision changes in last 24 hours. No numbness, tingling, dysarthria, falls, or acute gait disturbance   Having diffuse abdominal pain, left greater than right.  No hematuria but having increased urinary frequency despite finishing ciprofloxacin.  Intermittent constipation but had large bowel movement this morning.  Pain left flank, epigastrium, radiation to left shoulder.    Past Medical History: No date: Allergy     Comment:  seasonal No date: Anxiety No date: Asthma with allergic rhinitis No date: Cataract     Comment:  surgery No date: Cervical radiculopathy No date: Chronic kidney disease     Comment:  ladder is not filling up/ usually incontinent No date: Chronic pain No date: Diabetes mellitus without complication (HCC) No date: Fibromyalgia No date: GERD (gastroesophageal reflux disease) No date: H/O TIA (transient ischemic attack) and stroke No date: Headache No date: HLD (hyperlipidemia) No date: Hypertension No date: IBS (irritable bowel syndrome) No date: Insomnia No date: OSA (obstructive sleep  apnea) No date: Osteoarthritis No date: PAD (peripheral artery disease) ([place]) No date: Positive PPD No date: Sleep apnea     Comment:  doesn't use her CPAP No date: Stroke [facility])     Comment:  TIA's; MAy 2017 No date: Thyroid disease No date: Tuberculosis     Comment:  positive tb tests all of her life/ 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  Past Surgical History: No date: brain shunt     Comment:  hydrocephalis 2014: CATARACT EXTRACTION, BILATERAL 2009: HIP SURGERY     Comment:  hip muscle 07/2010: HYDROCELE EXCISION / REPAIR 02/07/2018: LAPAROSCOPIC ABDOMINAL EXPLORATION; N/A     Comment:  Procedure: LAPAROSCOPIC ABDOMINAL EXPLORATION;  Surgeon:              Alphonsa Overall, MD;  Location: [facility] ORS;  Service: General;               Laterality: N/A;  possible laparoscopic appendectomy,               need pathology report 2009: LUMBAR LAMINECTOMY No date: OVARIAN CYST REMOVAL     Comment:  with  fallopian tube No date: TOTAL ABDOMINAL HYSTERECTOMY    The history is provided by the patient. No language interpreter was used.  Flank Pain Associated symptoms include chest pain and headaches.      Home Medications Prior to Admission medications   Medication Sig Start Date End Date Taking? Authorizing Provider  acetaminophen (TYLENOL) 500 MG tablet Take 500-1,000 mg by mouth every 6 (six) hours as needed for moderate pain.    [provider]  acyclovir (ZOVIRAX) 400 MG tablet Take 400 mg by mouth 5 (five) times daily as needed (fever blisters).    [provider]  Artificial Saliva (BIOTENE MOISTURIZING MOUTH MT) Use as directed 1 spray in the mouth or throat every 4 (four) hours as needed (dry mouth).    [provider]  aspirin 81 MG tablet Take 81 mg by mouth daily.    [provider]  atorvastatin (LIPITOR) 40 MG tablet Take 40 mg by mouth daily.    [provider]  cetirizine (ZYRTEC) 10 MG tablet Take 10 mg by mouth daily.    [provider]   citalopram (CELEXA) 10 MG tablet Take 10 mg by mouth daily.    [provider]  dicyclomine (BENTYL) 10 MG capsule Take 1-2 capsules (10-20 mg total) by mouth every 8 (eight) hours as needed for spasms. Patient taking differently: Take 10-20 mg by mouth in the morning, at noon, in the evening, and at bedtime.  11/09/16   Armbruster, Carlota Raspberry, MD  fluticasone (FLONASE) 50 MCG/ACT nasal spray Place into both nostrils daily.    [provider]  gabapentin (NEURONTIN) 300 MG capsule Take 300-600 mg by mouth 2 (two) times daily. Take 2 capsules (600 mg) in the morning and Take 1 capsule (300 mg) in the evening    [provider]  guaiFENesin (ROBITUSSIN) 100 MG/5ML liquid Take 200 mg by mouth 3 (three) times daily as needed for cough.    [provider]  ketotifen (ZADITOR) 0.025 % ophthalmic solution Place 1 drop into both eyes every 8 (eight) hours as needed (irritation).    [provider]  Lactase (LACTAID FAST ACT) 9000 units TABS Take 1 tablet by mouth 3 (three) times daily as needed (lactose intolerance).    [provider]  Lactobacillus (ACIDOPHILUS) CAPS capsule Take 2 capsules by mouth daily.    [provider]  Lidocaine HCl (ASPERCREME LIDOCAINE) 4 % CREA Apply 1 application topically 3 (three) times daily as needed (pain).    [provider]  losartan-hydrochlorothiazide (HYZAAR) 100-25 MG tablet Take 1 tablet by mouth every evening.    [provider]  Melatonin 10 MG CAPS Take 1 capsule by mouth at bedtime.    [provider]  nystatin (NYSTATIN) powder Apply 1 application topically 3 (three) times daily as needed (skin irritation).    [provider]  ondansetron (ZOFRAN ODT) 4 MG disintegrating tablet Take 1 tablet (4 mg total) by mouth every 6 (six) hours as needed for nausea or vomiting. Patient not taking: Reported on 07/05/2020 11/09/16   Yetta Flock, MD  potassium chloride SA  (KLOR-CON) 20 MEQ tablet Take 1 tablet (20 mEq total) by mouth daily. Patient not taking: Reported on 07/05/2020 08/22/19   Malvin Johns, MD  primidone (MYSOLINE) 50 MG tablet Take 50 mg by mouth at bedtime.     [provider]  sennosides-docusate sodium (SENOKOT-S) 8.6-50 MG tablet Take 2 tablets by mouth 2 (two) times daily as needed for constipation.    [provider]  traMADol (ULTRAM) 50 MG tablet Take 50 mg by mouth 3 (three) times daily as needed for moderate pain.     [provider]      Allergies    Cefdinir, Clindamycin/lincomycin, Codeine, Cymbalta [duloxetine hcl], Sulfamethizole, and Oxycodone    Review of Systems   Review of Systems  Cardiovascular:  Positive for chest pain.  Gastrointestinal:  Positive for constipation and nausea.  Genitourinary:  Positive for flank pain and frequency.  Musculoskeletal:  Positive for arthralgias.  Neurological:  Positive for headaches.  All other systems reviewed and are negative.  Physical Exam Updated Vital Signs BP 128/67    Pulse (!) 57    Temp 98 F (36.7 C) (Oral)    Resp 12    Ht 5' (1.524 m)    Wt 67.1 kg    SpO2 95%    BMI 28.90 kg/m  Physical Exam Vitals and nursing note reviewed.  Constitutional:      General: She is not in acute distress.    Appearance: Normal appearance. She is not ill-appearing.  HENT:     Head: Normocephalic and atraumatic.     Right Ear: External ear normal.     Left Ear: External ear normal.     Nose: Nose normal.     Mouth/Throat:     Mouth: Mucous membranes are moist.  Eyes:     General: No scleral icterus.       Right eye: No discharge.        Left eye: No discharge.     Extraocular Movements: Extraocular movements intact.     Conjunctiva/sclera: Conjunctivae normal.     Pupils: Pupils are equal, round, and reactive to light.  Cardiovascular:     Rate and Rhythm: Normal rate and regular rhythm.     Pulses: Normal pulses.     Heart sounds: Normal heart  sounds.  Pulmonary:     Effort: Pulmonary effort is normal. No respiratory distress.     Breath sounds: Normal breath sounds.  Abdominal:     General: Abdomen is flat.     Palpations: Abdomen is soft.     Tenderness: There is abdominal tenderness.  Musculoskeletal:        General: Normal range of motion.     Cervical back: Normal range of motion.     Right lower leg: No edema.     Left lower leg: No edema.  Skin:    General: Skin is warm and dry.     Capillary Refill: Capillary refill takes less than 2 seconds.  Neurological:     General: No focal deficit present.     Mental Status: She is alert and oriented to person, place, and time.     GCS: GCS eye subscore is 4. GCS verbal subscore is 5. GCS motor subscore is 6.     Gait: Gait is intact.  Psychiatric:        Mood and Affect: Mood normal.        Behavior: Behavior normal.    ED Results / Procedures / Treatments   Labs (all labs ordered are listed, but only abnormal results are displayed) Labs Reviewed  COMPREHENSIVE METABOLIC PANEL - Abnormal; Notable for the following components:      Result Value   Glucose, Bld 112 (*)    All other components within normal limits  URINALYSIS, ROUTINE W REFLEX MICROSCOPIC - Abnormal; Notable for the following components:   Color, Urine ORANGE (*)    Glucose, UA 100 (*)    Protein, ur 30 (*)    Nitrite POSITIVE (*)    All other components within normal limits  URINALYSIS, MICROSCOPIC (REFLEX) - Abnormal; Notable for the following components:   Bacteria, UA RARE (*)    All other components within normal limits  RESP PANEL BY RT-PCR (FLU A&B, COVID) [id number]  URINE CULTURE  CBC WITH DIFFERENTIAL/PLATELET  LIPASE, BLOOD  TROPONIN  I (HIGH SENSITIVITY)  TROPONIN I (HIGH SENSITIVITY)    EKG EKG Interpretation  Date/Time:  Sunday December 04 2021 07:49:35 EDT Ventricular Rate:  65 PR Interval:  149 QRS Duration: 85 QT Interval:  413 QTC Calculation: 430 R Axis:   36 Text  Interpretation: Sinus rhythm similar to prior 11/20 no stemi Confirmed by Wynona Dove ([id number]) on 12/04/2021 7:57:06 AM  Radiology DG Skull 1-3 Views  Result Date: 12/04/2021 CLINICAL DATA:  Evaluation malfunction EXAM: SKULL - 1 VIEW COMPARISON:  07/05/2020 FINDINGS: Right parietal approach shunt catheter, programmable. No visible catheter continuity seen over the skull and visualized neck. IMPRESSION: Intact shunt catheter over the skull and visible neck. Electronically Signed   By: Jorje Guild M.D.   On: 12/04/2021 08:55   DG Chest 1 View  Result Date: 12/04/2021 CLINICAL DATA:  Evaluate for shunt malfunction EXAM: CHEST  1 VIEW COMPARISON:  07/05/2020 FINDINGS: Unremarkable shunt catheter traversing the midline chest. No evidence of discontinuity. Mild scarring in the left mid lung. There is no edema, consolidation, effusion, or pneumothorax. Normal heart size and mediastinal contours. IMPRESSION: Intact shunt catheter over the chest. Electronically Signed   By: Jorje Guild M.D.   On: 12/04/2021 08:54   DG Cervical Spine 1 View  Result Date: 12/04/2021 CLINICAL DATA:  86 year old female with history of abdominal pain and left flank pain. VP shunt. EXAM: DG CERVICAL SPINE - 1 VIEW COMPARISON:  Cervical spine radiograph 12/17/2019. FINDINGS: Right-sided VP shunt catheter is noted. The visualized portions of the catheter appear intact. Visualized bony structures and upper thorax are generally unremarkable, with exception for atherosclerotic calcifications in the thoracic aorta. IMPRESSION: 1. Visualized portions of the right VP shunt catheter are intact. Electronically Signed   By: Vinnie Langton M.D.   On: 12/04/2021 08:52   DG Abd 1 View  Result Date: 12/04/2021 CLINICAL DATA:  Evaluate for shunt malfunction EXAM: ABDOMEN - 1 VIEW COMPARISON:  Abdominal CT 06/09/2021 FINDINGS: Shunt tubing loops over the abdomen with tip left of midline, orientation similar to before. No mass effect seen  around the catheter. The bowel gas pattern is nonobstructive. IMPRESSION: Unremarkable shunt tubing looping over the abdomen. Electronically Signed   By: Jorje Guild M.D.   On: 12/04/2021 08:52   CT Head Wo Contrast  Result Date: 12/04/2021 CLINICAL DATA:  86 year old female with history of intracranial shunt placement presenting with abdominal pain and left-sided flank pain. EXAM: CT HEAD WITHOUT CONTRAST TECHNIQUE: Contiguous axial images were obtained from the base of the skull through the vertex without intravenous contrast. RADIATION DOSE REDUCTION: This exam was performed according to the departmental dose-optimization program which includes automated exposure control, adjustment of the mA and/or kV according to patient size and/or use of iterative reconstruction technique. COMPARISON:  Head CT 07/05/2020. FINDINGS: Brain: Right parieto-occipital ventriculostomy shunt catheter with tip extending across the midline into the anterior aspect of the left lateral ventricle, similar to the prior study. No hydrocephalus, ventricular size is stable compared to the prior study. Mild cerebral atrophy. Patchy and confluent areas of decreased attenuation are noted throughout the deep and periventricular white matter of the cerebral hemispheres bilaterally, compatible with chronic microvascular ischemic disease. No evidence of acute infarction, hemorrhage, extra-axial collection or mass lesion/mass effect. Vascular: No hyperdense vessel or unexpected calcification. Skull: Normal. Negative for fracture or focal lesion. Sinuses/Orbits: No acute finding. Other: None. IMPRESSION: 1. No acute intracranial abnormalities. 2. Right ventriculostomy shunt catheter appears stable in position. No hydrocephalus. 3. Mild cerebral  atrophy with chronic microvascular ischemic changes in the cerebral white matter, similar to the prior study, as above. Electronically Signed   By: Vinnie Langton M.D.   On: 12/04/2021 08:40   CT  ABDOMEN PELVIS W CONTRAST  Result Date: 12/04/2021 CLINICAL DATA:  86 year old female with history of abdominal pain. EXAM: CT ABDOMEN AND PELVIS WITH CONTRAST TECHNIQUE: Multidetector CT imaging of the abdomen and pelvis was performed using the standard protocol following bolus administration of intravenous contrast. RADIATION DOSE REDUCTION: This exam was performed according to the departmental dose-optimization program which includes automated exposure control, adjustment of the mA and/or kV according to patient size and/or use of iterative reconstruction technique. CONTRAST:  147m OMNIPAQUE IOHEXOL 300 MG/ML  SOLN COMPARISON:  CT the abdomen and pelvis 06/09/2021. FINDINGS: Lower chest: Atherosclerotic calcifications are noted in the descending thoracic aorta as well as the right coronary artery. Calcifications of the mitral annulus. Hepatobiliary: No suspicious cystic or solid hepatic lesions. No intra or extrahepatic biliary ductal dilatation. Gallbladder is normal in appearance. Pancreas: No pancreatic mass. No pancreatic ductal dilatation. No pancreatic or peripancreatic fluid collections or inflammatory changes. Spleen: Unremarkable. Adrenals/Urinary Tract: Bilateral kidneys and bilateral adrenal glands are normal in appearance. No hydroureteronephrosis. Urinary bladder is normal in appearance. Stomach/Bowel: Normal appearance of the stomach. No pathologic dilatation of small bowel or colon. Numerous colonic diverticulae are noted, without surrounding inflammatory changes to suggest an acute diverticulitis at this time. Status post appendectomy. Vascular/Lymphatic: Aortic atherosclerosis, without evidence of aneurysm or dissection in the abdominal or pelvic vasculature. No lymphadenopathy noted in the abdomen or pelvis. Reproductive: Status post hysterectomy. Ovaries are not confidently identified may be surgically absent or atrophic. Other: Ventriculostomy shunt catheter coiled in the right-side of the  abdomen, with tip terminating near the midline. No unexpected fluid collection associated with the tip of the catheter. No significant volume of ascites. No pneumoperitoneum. Musculoskeletal: There are no aggressive appearing lytic or blastic lesions noted in the visualized portions of the skeleton. IMPRESSION: 1. No acute findings are noted in the abdomen or pelvis to account for the patient's symptoms. 2. Aortic atherosclerosis. 3. Colonic diverticulosis without evidence of acute diverticulitis at this time. 4. Incidental findings, as above. Electronically Signed   By: DVinnie LangtonM.D.   On: 12/04/2021 08:45    Procedures Procedures    Medications Ordered in ED Medications  sodium chloride 0.9 % bolus 1,000 mL (0 mLs Intravenous Stopped 12/04/21 0954)  famotidine (PEPCID) IVPB 20 mg premix (0 mg Intravenous Stopped 12/04/21 0921)  alum & mag hydroxide-simeth (MAALOX/MYLANTA) 200-200-20 MG/5ML suspension 30 mL (30 mLs Oral Given 12/04/21 0748)    And  lidocaine (XYLOCAINE) 2 % viscous mouth solution 15 mL (15 mLs Oral Given 12/04/21 0748)  morphine (PF) 2 MG/ML injection 2 mg (2 mg Intravenous Given 12/04/21 0748)  ondansetron (ZOFRAN) injection 4 mg (4 mg Intravenous Given 12/04/21 0747)  iohexol (OMNIPAQUE) 300 MG/ML solution 100 mL (100 mLs Intravenous Contrast Given 12/04/21 0808)  fosfomycin (MONUROL) packet 3 g (3 g Oral Given 12/04/21 1025)    ED Course/ Medical Decision Making/ A&P Clinical Course as of 12/04/21 1056  Sun Dec 04, 2021  0817 Nitrite(!): POSITIVE [SG]  0818 Bacteria, UA(!): RARE [SG]  0818 WBC, UA: 6-10 UA remains boirderline positive w/ nitrites, rare bacateria, only 6-10 WBC but in setting of +frequency and +nitrites, recent completion of oral abx; would presume ongoing infection; recently completed CIPRO, no recent ctx.  [SG]  [id number] she took asa  this am '325mg'$   [SG]  0858 Shunt series reviewed, shunt intact without noted obstruction. CT head/CT a/p stable. Shows  some diverticulosis but no diverticulitis. No obstructive nephropathy. Agree with radiologist interpretation.  [SG]  [id number] Patient reassessed, reports that she is feeling significantly better.  She has multiple drug allergies, question of angioedema associated with cephalosporins although she has taken Keflex in the past.  No evidence of pyelo, not septic, renal function WNL, will give Fosfomycin x1.  [SG]    Clinical Course User Index [SG] Jeanell Sparrow, DO                           Medical Decision Making Amount and/or Complexity of Data Reviewed Labs: ordered. Decision-making details documented in ED Course. Radiology: ordered.  Risk OTC drugs. Prescription drug management.   Initial Impression and Ddx This patient presents to the Emergency Department for the above complaint. This involves an extensive number of treatment options and is a complaint that carries with it a high risk of complications and morbidity. Vital signs were reviewed. Serious etiologies considered. Ddx includes but is not limited to: UTI, kidney stone, pyelo, acs, shunt malfunction  Patient PMH that increases complexity of ED encounter:  med non-compliance, HTN, HLD, CVA  Previous records obtained and reviewed  -Echo 10/21, EF 60-65%, indeterminate diastolic dysfunction  Given patient's extensive medical history and noncompliance of medications will obtain broad work-up including CT imaging, shunt series, cardiac work-up  Interpretation of Diagnostics Labs & imaging results that were available during my care of the patient were visualized by me and considered in my medical decision making.   I ordered imaging studies which included CT a/p, shunt series w/ CTH and I visualized the imaging and I agree with radiologist interpretation. No acute process  EKG w/o STEMI, CXR neg, no chest pain or dib ongoing, no trop elevation. ACS is unlikely at this time.   Cardiac monitoring reviewed and interpreted personally  which shows NSR  Social determinants of health include - med non comp  Clinical Course as of 12/04/21 1056  Sun Dec 04, 2021  0817 Nitrite(!): POSITIVE [SG]  0818 Bacteria, UA(!): RARE [SG]  0818 WBC, UA: 6-10 UA remains boirderline positive w/ nitrites, rare bacateria, only 6-10 WBC but in setting of +frequency and +nitrites, recent completion of oral abx; would presume ongoing infection; recently completed CIPRO, no recent ctx.  [SG]  [id number] Rpts she took asa this am '325mg'$   [SG]  0858 Shunt series reviewed, shunt intact without noted obstruction. CT head/CT a/p stable. Shows some diverticulosis but no diverticulitis. No obstructive nephropathy. Agree with radiologist interpretation.  [SG]  [phone] Patient reassessed, reports that she is feeling significantly better.  She has multiple drug allergies, question of angioedema associated with cephalosporins although she has taken Keflex in the past.  No evidence of pyelo, not septic, renal function WNL, will give Fosfomycin x1.  [SG]    Clinical Course User Index [SG] Jeanell Sparrow, DO      Patient Reassessment and Ultimate Disposition/Management   Patient management required discussion with the following services or consulting groups:  None  Complexity of Problems Addressed Acute illness or injury that poses threat of life of bodily function  Additional Data Reviewed and Analyzed Further history obtained from: EMS on arrival, Past medical history and medications listed in the EMR, Prior ED visit notes, and Care Everywhere  Patient Encounter Risk Assessment Consideration of hospitalization   The  patient improved significantly and was discharged in stable condition. Detailed discussions were had with the patient regarding current findings, and need for close f/u with PCP or on call doctor. The patient has been instructed to return immediately if the symptoms worsen in any way for re-evaluation. Patient verbalized understanding and is in  agreement with current care plan. All questions answered prior to discharge.     This chart was dictated using voice recognition software.  Despite best efforts to proofread,  errors can occur which can change the documentation meaning.         Final Clinical Impression(s) / ED Diagnoses Final diagnoses:  VP shunt present  Urinary tract infection without hematuria, site unspecified    Rx / DC Orders ED Discharge Orders     None         Jeanell Sparrow, DO 12/04/21 1056

## 2021-12-04 NOTE — ED Notes (Signed)
Pt denies CP at this time 

## 2021-12-04 NOTE — ED Notes (Signed)
Patient ambulatory to restroom  ?

## 2021-12-05 LAB — URINE CULTURE: Culture: <10000 — AB

## 2022-01-20 ENCOUNTER — Ambulatory Visit: Payer: Medicare (Managed Care) | Admitting: Gastroenterology

## 2022-03-21 ENCOUNTER — Ambulatory Visit: Payer: Medicare (Managed Care) | Admitting: Gastroenterology

## 2022-06-17 ENCOUNTER — Emergency Department (HOSPITAL_BASED_OUTPATIENT_CLINIC_OR_DEPARTMENT_OTHER): Payer: Medicare (Managed Care)

## 2022-06-17 ENCOUNTER — Emergency Department (HOSPITAL_BASED_OUTPATIENT_CLINIC_OR_DEPARTMENT_OTHER)
Admission: EM | Admit: 2022-06-17 | Discharge: 2022-06-17 | Disposition: A | Discharge status: Home / Self Care | Payer: Medicare (Managed Care) | Attending: Emergency Medicine | Admitting: Emergency Medicine

## 2022-06-17 ENCOUNTER — Emergency Department (HOSPITAL_COMMUNITY): Payer: Medicare (Managed Care)

## 2022-06-17 ENCOUNTER — Encounter (HOSPITAL_BASED_OUTPATIENT_CLINIC_OR_DEPARTMENT_OTHER): Payer: Self-pay | Admitting: Emergency Medicine

## 2022-06-17 DIAGNOSIS — R109 Unspecified abdominal pain: Secondary | ICD-10-CM | POA: Diagnosis present

## 2022-06-17 DIAGNOSIS — N189 Chronic kidney disease, unspecified: Secondary | ICD-10-CM | POA: Diagnosis not present

## 2022-06-17 DIAGNOSIS — Z7982 Long term (current) use of aspirin: Secondary | ICD-10-CM | POA: Diagnosis not present

## 2022-06-17 DIAGNOSIS — I129 Hypertensive chronic kidney disease with stage 1 through stage 4 chronic kidney disease, or unspecified chronic kidney disease: Secondary | ICD-10-CM | POA: Diagnosis not present

## 2022-06-17 DIAGNOSIS — M25512 Pain in left shoulder: Secondary | ICD-10-CM | POA: Insufficient documentation

## 2022-06-17 DIAGNOSIS — Z20822 Contact with and (suspected) exposure to covid-19: Secondary | ICD-10-CM | POA: Diagnosis not present

## 2022-06-17 DIAGNOSIS — E1122 Type 2 diabetes mellitus with diabetic chronic kidney disease: Secondary | ICD-10-CM | POA: Insufficient documentation

## 2022-06-17 DIAGNOSIS — Z79899 Other long term (current) drug therapy: Secondary | ICD-10-CM | POA: Insufficient documentation

## 2022-06-17 LAB — URINALYSIS, ROUTINE W REFLEX MICROSCOPIC
Bilirubin Urine: NEGATIVE
Glucose, UA: NEGATIVE mg/dL
Ketones, ur: NEGATIVE mg/dL
Nitrite: NEGATIVE
Protein, ur: NEGATIVE mg/dL
Specific Gravity, Urine: 1.015 (ref 1.005–1.030)
pH: 7 (ref 5.0–8.0)

## 2022-06-17 LAB — CBC WITH DIFFERENTIAL/PLATELET
Abs Immature Granulocytes: 0.02 10*3/uL (ref 0.00–0.07)
Basophils Absolute: 0.1 10*3/uL (ref 0.0–0.1)
Basophils Relative: 1 %
Eosinophils Absolute: 0.2 10*3/uL (ref 0.0–0.5)
Eosinophils Relative: 2 %
HCT: 40.9 % (ref 36.0–46.0)
Hemoglobin: 13.5 g/dL (ref 12.0–15.0)
Immature Granulocytes: 0 %
Lymphocytes Relative: 22 %
Lymphs Abs: 1.5 10*3/uL (ref 0.7–4.0)
MCH: 29.3 pg (ref 26.0–34.0)
MCHC: 33 g/dL (ref 30.0–36.0)
MCV: 88.9 fL (ref 80.0–100.0)
Monocytes Absolute: 0.5 10*3/uL (ref 0.1–1.0)
Monocytes Relative: 8 %
Neutro Abs: 4.5 10*3/uL (ref 1.7–7.7)
Neutrophils Relative %: 67 %
Platelets: 151 10*3/uL (ref 150–400)
RBC: 4.6 MIL/uL (ref 3.87–5.11)
RDW: 13.2 % (ref 11.5–15.5)
WBC: 6.7 10*3/uL (ref 4.0–10.5)
nRBC: 0 % (ref 0.0–0.2)

## 2022-06-17 LAB — URINALYSIS, MICROSCOPIC (REFLEX)

## 2022-06-17 LAB — COMPREHENSIVE METABOLIC PANEL
ALT: 12 U/L (ref 0–44)
AST: 16 U/L (ref 15–41)
Albumin: 3.9 g/dL (ref 3.5–5.0)
Alkaline Phosphatase: 81 U/L (ref 38–126)
Anion gap: 9 (ref 5–15)
BUN: 25 mg/dL — ABNORMAL HIGH (ref 8–23)
CO2: 25 mmol/L (ref 22–32)
Calcium: 9.2 mg/dL (ref 8.9–10.3)
Chloride: 105 mmol/L (ref 98–111)
Creatinine, Ser: 0.79 mg/dL (ref 0.44–1.00)
GFR, Estimated: >60 mL/min (ref >60)
Glucose, Bld: 132 mg/dL — ABNORMAL HIGH (ref 70–99)
Potassium: 3.6 mmol/L (ref 3.5–5.1)
Sodium: 139 mmol/L (ref 135–145)
Total Bilirubin: 0.4 mg/dL (ref 0.3–1.2)
Total Protein: 6.9 g/dL (ref 6.5–8.1)

## 2022-06-17 LAB — RESP PANEL BY RT-PCR (FLU A&B, COVID) ARPGX2
Influenza A by PCR: NEGATIVE
Influenza B by PCR: NEGATIVE
SARS Coronavirus 2 by RT PCR: NEGATIVE

## 2022-06-17 LAB — LIPASE, BLOOD: Lipase: 40 U/L (ref 11–51)

## 2022-06-17 LAB — TROPONIN I (HIGH SENSITIVITY)
Troponin I (High Sensitivity): 7 ng/L (ref <18)
Troponin I (High Sensitivity): 7 ng/L (ref <18)

## 2022-06-17 MED ORDER — HYDROCODONE-ACETAMINOPHEN 5-325 MG PO TABS
1.0000 | ORAL_TABLET | Freq: Once | ORAL | Status: DC
  Filled 2022-06-17: qty 1

## 2022-06-17 MED ORDER — KETOROLAC TROMETHAMINE 15 MG/ML IJ SOLN
15.0000 mg | Freq: Once | INTRAMUSCULAR | Status: AC
  Administered 2022-06-17: 15 mg via INTRAMUSCULAR
  Filled 2022-06-17: qty 1

## 2022-06-17 MED ORDER — LOSARTAN POTASSIUM 25 MG PO TABS
100.0000 mg | ORAL_TABLET | Freq: Once | ORAL | Status: AC
  Administered 2022-06-17: 100 mg via ORAL
  Filled 2022-06-17: qty 4

## 2022-06-17 MED ORDER — ACETAMINOPHEN 325 MG PO TABS
650.0000 mg | ORAL_TABLET | Freq: Once | ORAL | Status: AC
  Administered 2022-06-17: 650 mg via ORAL
  Filled 2022-06-17: qty 2

## 2022-06-17 MED ORDER — IOHEXOL 300 MG/ML  SOLN
80.0000 mL | Freq: Once | INTRAMUSCULAR | Status: AC | PRN
  Administered 2022-06-17: 80 mL via INTRAVENOUS

## 2022-06-17 MED ORDER — LIDOCAINE 5 % EX PTCH
1.0000 | MEDICATED_PATCH | CUTANEOUS | Status: DC
  Administered 2022-06-17: 1 via TRANSDERMAL
  Filled 2022-06-17: qty 1

## 2022-06-17 NOTE — ED Triage Notes (Signed)
Pt reports LT flank pain that started this morning after cleaning bathroom; pain has worsened since then and now is also in LUQ

## 2022-06-17 NOTE — Discharge Instructions (Addendum)
As we discussed, symptoms seem more musculoskeletal in nature.  Look out for rash in your left flank/abdomen area given area where you are tender.  Concern for possible shingles.  Continue take at home Tylenol/ibuprofen as needed for pain.  Follow-up with PCP at the beginning next week for reevaluation.  Please not hesitate to return to the emergency department the worrisome signs symptoms we discussed become apparent.

## 2022-06-17 NOTE — ED Provider Notes (Signed)
[facility] DEPARTMENT Provider Note   CSN: [id number] Arrival date & time: 06/17/22  1253     History  Chief Complaint  Patient presents with   Flank Pain    Brittany Atkins is a 86 y.o. female.   Flank Pain   86 year old female presents emergency department with complaints of left-sided flank pain, left-sided abdominal pain, left shoulder pain that all began earlier this morning after cleaning the bathroom.  Patient notes symptoms initially started with left-sided flank pain that then migrated underneath her left breast and separately left shoulder.  Denies associated shortness of breath, chest pain, fever, chills, cough, congestion, vomiting, urinary symptoms, vaginal symptoms, change in bowel habits, headache, dizziness, lightheadedness.  Patient states that she has had symptoms similar in the past with reported urinary tract infection and was seen on 12/04/2021.  Patient states she is not taking her at home antihypertensive medications today.  Past medical history significant for IBS, hypertension, CKD, diabetes mellitus, TIA, CVA, OSA, thyroid disease, tuberculosis, VP shunt 2 years ago.  Home Medications Prior to Admission medications   Medication Sig Start Date End Date Taking? Authorizing Provider  acetaminophen (TYLENOL) 500 MG tablet Take 500-1,000 mg by mouth every 6 (six) hours as needed for moderate pain.    [provider]  acyclovir (ZOVIRAX) 400 MG tablet Take 400 mg by mouth 5 (five) times daily as needed (fever blisters).    [provider]  Artificial Saliva (BIOTENE MOISTURIZING MOUTH MT) Use as directed 1 spray in the mouth or throat every 4 (four) hours as needed (dry mouth).    [provider]  aspirin 81 MG tablet Take 81 mg by mouth daily.    [provider]  atorvastatin (LIPITOR) 40 MG tablet Take 40 mg by mouth daily.    [provider]  cetirizine (ZYRTEC) 10 MG tablet Take 10 mg by  mouth daily.    [provider]  citalopram (CELEXA) 10 MG tablet Take 10 mg by mouth daily.    [provider]  dicyclomine (BENTYL) 10 MG capsule Take 1-2 capsules (10-20 mg total) by mouth every 8 (eight) hours as needed for spasms. Patient taking differently: Take 10-20 mg by mouth in the morning, at noon, in the evening, and at bedtime.  11/09/16   Armbruster, Carlota Raspberry, MD  fluticasone (FLONASE) 50 MCG/ACT nasal spray Place into both nostrils daily.    [provider]  gabapentin (NEURONTIN) 300 MG capsule Take 300-600 mg by mouth 2 (two) times daily. Take 2 capsules (600 mg) in the morning and Take 1 capsule (300 mg) in the evening    [provider]  guaiFENesin (ROBITUSSIN) 100 MG/5ML liquid Take 200 mg by mouth 3 (three) times daily as needed for cough.    [provider]  ketotifen (ZADITOR) 0.025 % ophthalmic solution Place 1 drop into both eyes every 8 (eight) hours as needed (irritation).    [provider]  Lactase (LACTAID FAST ACT) 9000 units TABS Take 1 tablet by mouth 3 (three) times daily as needed (lactose intolerance).    [provider]  Lactobacillus (ACIDOPHILUS) CAPS capsule Take 2 capsules by mouth daily.    [provider]  Lidocaine HCl (ASPERCREME LIDOCAINE) 4 % CREA Apply 1 application topically 3 (three) times daily as needed (pain).    [provider]  losartan-hydrochlorothiazide (HYZAAR) 100-25 MG tablet Take 1 tablet by mouth every evening.    [provider]  Melatonin 10 MG  CAPS Take 1 capsule by mouth at bedtime.    [provider]  nystatin (NYSTATIN) powder Apply 1 application topically 3 (three) times daily as needed (skin irritation).    [provider]  ondansetron (ZOFRAN ODT) 4 MG disintegrating tablet Take 1 tablet (4 mg total) by mouth every 6 (six) hours as needed for nausea or vomiting. Patient not taking: Reported on 07/05/2020 11/09/16    Yetta Flock, MD  potassium chloride SA (KLOR-CON) 20 MEQ tablet Take 1 tablet (20 mEq total) by mouth daily. Patient not taking: Reported on 07/05/2020 08/22/19   Malvin Johns, MD  primidone (MYSOLINE) 50 MG tablet Take 50 mg by mouth at bedtime.     [provider]  sennosides-docusate sodium (SENOKOT-S) 8.6-50 MG tablet Take 2 tablets by mouth 2 (two) times daily as needed for constipation.    [provider]  traMADol (ULTRAM) 50 MG tablet Take 50 mg by mouth 3 (three) times daily as needed for moderate pain.     [provider]      Allergies    Cefdinir, Clindamycin/lincomycin, Codeine, Cymbalta [duloxetine hcl], Sulfamethizole, and Oxycodone    Review of Systems   Review of Systems  Genitourinary:  Positive for flank pain.  All other systems reviewed and are negative.   Physical Exam Updated Vital Signs BP (!) 145/87   Pulse 61   Temp 98.3 F (36.8 C)   Resp 13   Ht '5\' 2"'$  (1.575 m)   Wt 68 kg   SpO2 96%   BMI 27.44 kg/m  Physical Exam Vitals and nursing note reviewed.  Constitutional:      General: She is not in acute distress.    Appearance: She is well-developed.  HENT:     Head: Normocephalic and atraumatic.  Eyes:     Conjunctiva/sclera: Conjunctivae normal.  Cardiovascular:     Rate and Rhythm: Normal rate and regular rhythm.     Pulses: Normal pulses.     Comments: Radial posterior tibial pulses full and intact bilaterally. Pulmonary:     Effort: Pulmonary effort is normal. No respiratory distress.     Breath sounds: Normal breath sounds.  Abdominal:     Palpations: Abdomen is soft.     Tenderness: There is abdominal tenderness. There is left CVA tenderness. There is no right CVA tenderness or guarding. Negative signs include Murphy's sign and McBurney's sign.     Comments: Left-sided CVA tenderness that wraps around in dermatomal fashion up to left upper quadrant.  No obvious overlying skin abnormalities noted.   Musculoskeletal:        General: No swelling.     Cervical back: Normal range of motion and neck supple. No rigidity or tenderness.     Comments: Patient has tenderness to palpation of left trapezial ridge.  Pain is exacerbated with resisted horizontal AB duction of left shoulder.  No observable skin abnormalities noted.  Muscle strength 5 out of 5 for upper and lower extremities.  Patient complaining no sensory deficits along major nerve distributions lower extremities.  Skin:    General: Skin is warm and dry.     Capillary Refill: Capillary refill takes less than 2 seconds.  Neurological:     Mental Status: She is alert.     GCS: GCS eye subscore is 4. GCS verbal subscore is 5. GCS motor subscore is 6.  Psychiatric:        Mood and Affect: Mood normal.     ED Results /  Procedures / Treatments   Labs (all labs ordered are listed, but only abnormal results are displayed) Labs Reviewed  COMPREHENSIVE METABOLIC PANEL - Abnormal; Notable for the following components:      Result Value   Glucose, Bld 132 (*)    BUN 25 (*)    All other components within normal limits  URINALYSIS, ROUTINE W REFLEX MICROSCOPIC - Abnormal; Notable for the following components:   Hgb urine dipstick TRACE (*)    Leukocytes,Ua SMALL (*)    All other components within normal limits  URINALYSIS, MICROSCOPIC (REFLEX) - Abnormal; Notable for the following components:   Bacteria, UA FEW (*)    All other components within normal limits  RESP PANEL BY RT-PCR (FLU A&B, COVID) [id number]  URINE CULTURE  CBC WITH DIFFERENTIAL/PLATELET  LIPASE, BLOOD  OCCULT BLOOD X 1 CARD TO LAB, STOOL  TROPONIN I (HIGH SENSITIVITY)  TROPONIN I (HIGH SENSITIVITY)    EKG EKG Interpretation  Date/Time:  Saturday June 17 2022 12:56:59 EDT Ventricular Rate:  65 PR Interval:  157 QRS Duration: 89 QT Interval:  386 QTC Calculation: 402 R Axis:   24 Text Interpretation: Sinus rhythm Abnormal R-wave progression, early  transition No acute changes Confirmed by Georgina Snell [phone]) on 06/17/2022 1:01:50 PM  Radiology CT Abdomen Pelvis W Contrast  Result Date: 06/17/2022 CLINICAL DATA:  Left lower quadrant pain. EXAM: CT ABDOMEN AND PELVIS WITH CONTRAST TECHNIQUE: Multidetector CT imaging of the abdomen and pelvis was performed using the standard protocol following bolus administration of intravenous contrast. RADIATION DOSE REDUCTION: This exam was performed according to the departmental dose-optimization program which includes automated exposure control, adjustment of the mA and/or kV according to patient size and/or use of iterative reconstruction technique. CONTRAST:  16m OMNIPAQUE IOHEXOL 300 MG/ML  SOLN COMPARISON:  CT abdomen and pelvis 12/04/2021 FINDINGS: Lower chest: No acute abnormality. Hepatobiliary: No focal liver abnormality is seen. No gallstones, gallbladder wall thickening, or biliary dilatation. Pancreas: Unremarkable. No pancreatic ductal dilatation or surrounding inflammatory changes. Spleen: Normal in size without focal abnormality. Adrenals/Urinary Tract: Adrenal glands are unremarkable. Kidneys are normal, without renal calculi, focal lesion, or hydronephrosis. Bladder is unremarkable. Stomach/Bowel: There is a small hiatal hernia. Stomach is otherwise within normal limits. Appendix is not seen. No evidence of bowel wall thickening, distention, or inflammatory changes. Colonic diverticulosis is present. Vascular/Lymphatic: Aortic atherosclerosis. No enlarged abdominal or pelvic lymph nodes. Reproductive: Status post hysterectomy. No adnexal masses. Other: Small fat containing umbilical hernia. No free fluid. VP shunt catheter is present with distal tip ending in the central abdomen. The visualized portion of the catheter appears intact. Musculoskeletal: Degenerative changes affect the spine. Orthopedic screw fragment seen in the left greater trochanter, unchanged. IMPRESSION: 1. No acute localizing  process in the abdomen or pelvis. 2. Colonic diverticulosis. 3. VP shunt catheter. 4.  Aortic Atherosclerosis (ICD10-I70.0). 5. Small hiatal hernia Electronically Signed   By: ARonney AstersM.D.   On: 06/17/2022 15:29   DG Chest Port 1 View  Result Date: 06/17/2022 CLINICAL DATA:  Chest pain, left flank pain EXAM: PORTABLE CHEST 1 VIEW COMPARISON:  12/04/2021 FINDINGS: Cardiac size is within normal limits. Lung fields are clear of any infiltrates or pulmonary edema. There is no pleural effusion or pneumothorax. Ventriculoperitoneal shunt is noted on the right side. Degenerative changes are noted in right AC and left shoulder joints. IMPRESSION: There are no signs of pulmonary edema or focal pulmonary consolidation. Electronically Signed   By: PElmer PickerM.D.   On:  06/17/2022 13:41    Procedures Procedures    Medications Ordered in ED Medications  lidocaine (LIDODERM) 5 % 1 patch (1 patch Transdermal Patch Applied 06/17/22 1702)  ketorolac (TORADOL) 15 MG/ML injection 15 mg (has no administration in time range)  losartan (COZAAR) tablet 100 mg (100 mg Oral Given 06/17/22 1339)  iohexol (OMNIPAQUE) 300 MG/ML solution 80 mL (80 mLs Intravenous Contrast Given 06/17/22 1449)  acetaminophen (TYLENOL) tablet 650 mg (650 mg Oral Given 06/17/22 1702)    ED Course/ Medical Decision Making/ A&P Clinical Course as of 06/17/22 1705  Sat Jun 17, 2022  1605 Troponin I (High Sensitivity) [VB]    Clinical Course User Index [VB] Elgie Congo, MD                           Medical Decision Making Amount and/or Complexity of Data Reviewed Labs: ordered. Decision-making details documented in ED Course. Radiology: ordered.  Risk OTC drugs. Prescription drug management.   This patient presents to the ED for concern of flank pain, this involves an extensive number of treatment options, and is a complaint that carries with it a high risk of complications and morbidity.  The differential  diagnosis includes nephrolithiasis, AAA, aortic dissection, pneumothorax, ACS, pancreatitis, small bowel obstruction, splenic infarction   Co morbidities that complicate the patient evaluation  See HPI   Additional history obtained:  Additional history obtained from EMR External records from outside source obtained and reviewed including prior CT abdomen pelvis from 12/04/2021 indicating no acute findings.   Lab Tests:  I Ordered, and personally interpreted labs.  The pertinent results include: No leukocytosis noted.  No diabetes or anemia.  Platelets with normal range.  Electrolytes within normal range.  Slight elevation of BUN.  Patient not actively anemic or complaining of hematochezia/melena.  Follow-up with PCP regarding findings today.  No transaminitis noted.  Renal function within normal limits.  UA significant for small leukocytes and few bacteria; patient not currently complaining of urinary symptoms so will not treat at this time -culture pending.  Lipase negative.  Rest provided.  Negative.  Initial troponin 7 with repeat 7 with no new EKG findings indicative of ischemia.  Doubt ACS.   Imaging Studies ordered:  I ordered imaging studies including chest x-ray, CT abdomen pelvis I independently visualized and interpreted imaging which showed  Chest x-ray: No acute cardiopulmonary abnormalities CT abdomen pelvis: No acute localizing process in the abdomen or pelvis.  Colonic diverticulosis.  VP shunt catheter.  Mechanical sclerosis.  Small hiatal hernia. I agree with the radiologist interpretation   Cardiac Monitoring: / EKG:  The patient was maintained on a cardiac monitor.  I personally viewed and interpreted the cardiac monitored which showed an underlying rhythm of: Sinus rhythm with no acute changes.   Consultations Obtained:  Attending attending physician Dr. Nechama Guard was consulted regarding the patient agreed with treatment plan going forward.  Problem List / ED  Course / Critical interventions / Medication management  Left flank pain left shoulder pain I ordered medication including Lidoderm, Toradol, Tylenol for pain.  Cozaar for antihypertensive   Reevaluation of the patient after these medicines showed that the patient improved I have reviewed the patients home medicines and have made adjustments as needed   Social Determinants of Health:  Denies tobacco, illicit drug use   Test / Admission - Considered:  Left shoulder pain, left flank pain Vitals signs significant for initial hypertension with blood pressure  189/71 which decreased with administration of at home antihypertensives.  Patient recommended close follow-up with PCP regarding elevation of blood pressure. Otherwise within normal range and stable throughout visit. Laboratory/imaging studies significant for: See above Patient's left shoulder pain seemed muscular in nature given area of tenderness as well as reproducibility on exam.  Patient's flank pain is dermatomal in fashion without associated cardiac or abdominal features such as shortness of breath, chest pain, nausea, vomiting, diarrhea.  Imaging was negative for any acute abnormalities.  Doubt ACS given delta negative troponin and no new EKG findings indicative of ischemia.  Patient is recommended continued at home therapy with Tylenol/Motrin as needed for pain/inflammation.  No observable rash at this time but concern for possible varicella.  Pain could be coming peripheral nerve impingement.  Patient recommended close follow-up with PCP early next week.  Treatment plan discussed along with patient she knowledge understanding was agreeable to said plan. Worrisome signs and symptoms were discussed with the patient, and the patient acknowledged understanding to return to the ED if noticed. Patient was stable upon discharge.         Final Clinical Impression(s) / ED Diagnoses Final diagnoses:  Flank pain  Acute pain of left  shoulder    Rx / DC Orders ED Discharge Orders     None         Wilnette Kales, [place] 06/17/22 1705    Elgie Congo, MD 06/17/22 [phone]

## 2022-06-19 LAB — URINE CULTURE: Culture: <10000 — AB

## 2023-01-30 ENCOUNTER — Other Ambulatory Visit: Payer: Self-pay | Admitting: Internal Medicine

## 2023-01-30 ENCOUNTER — Encounter: Payer: Self-pay | Admitting: Internal Medicine

## 2023-01-30 DIAGNOSIS — I6523 Occlusion and stenosis of bilateral carotid arteries: Secondary | ICD-10-CM

## 2023-01-30 DIAGNOSIS — G250 Essential tremor: Secondary | ICD-10-CM

## 2023-01-30 DIAGNOSIS — Z8673 Personal history of transient ischemic attack (TIA), and cerebral infarction without residual deficits: Secondary | ICD-10-CM

## 2023-02-16 ENCOUNTER — Ambulatory Visit
Admission: RE | Admit: 2023-02-16 | Discharge: 2023-02-16 | Disposition: A | Discharge status: Home / Self Care | Payer: Medicare (Managed Care) | Source: Ambulatory Visit | Attending: Internal Medicine | Admitting: Internal Medicine

## 2023-02-16 DIAGNOSIS — Z8673 Personal history of transient ischemic attack (TIA), and cerebral infarction without residual deficits: Secondary | ICD-10-CM

## 2023-02-16 DIAGNOSIS — G250 Essential tremor: Secondary | ICD-10-CM

## 2023-02-16 DIAGNOSIS — I6523 Occlusion and stenosis of bilateral carotid arteries: Secondary | ICD-10-CM

## 2023-02-28 ENCOUNTER — Other Ambulatory Visit: Payer: Self-pay | Admitting: Internal Medicine

## 2023-02-28 DIAGNOSIS — E739 Lactose intolerance, unspecified: Secondary | ICD-10-CM

## 2023-02-28 DIAGNOSIS — K219 Gastro-esophageal reflux disease without esophagitis: Secondary | ICD-10-CM

## 2023-02-28 DIAGNOSIS — K582 Mixed irritable bowel syndrome: Secondary | ICD-10-CM

## 2023-03-28 ENCOUNTER — Other Ambulatory Visit: Payer: Medicare (Managed Care)

## 2023-04-03 ENCOUNTER — Ambulatory Visit
Admission: RE | Admit: 2023-04-03 | Discharge: 2023-04-03 | Disposition: A | Discharge status: Home / Self Care | Payer: Medicare (Managed Care) | Source: Ambulatory Visit | Attending: Internal Medicine | Admitting: Internal Medicine

## 2023-04-03 DIAGNOSIS — K582 Mixed irritable bowel syndrome: Secondary | ICD-10-CM

## 2023-04-03 DIAGNOSIS — E739 Lactose intolerance, unspecified: Secondary | ICD-10-CM

## 2023-04-03 DIAGNOSIS — K219 Gastro-esophageal reflux disease without esophagitis: Secondary | ICD-10-CM

## 2023-06-11 ENCOUNTER — Encounter: Payer: Self-pay | Admitting: Internal Medicine

## 2023-06-27 ENCOUNTER — Other Ambulatory Visit: Payer: Self-pay | Admitting: Internal Medicine

## 2023-06-27 DIAGNOSIS — Z1231 Encounter for screening mammogram for malignant neoplasm of breast: Secondary | ICD-10-CM

## 2023-06-28 ENCOUNTER — Ambulatory Visit: Payer: Medicare (Managed Care)

## 2023-07-31 ENCOUNTER — Ambulatory Visit
Admission: RE | Admit: 2023-07-31 | Discharge: 2023-07-31 | Disposition: A | Discharge status: Home / Self Care | Payer: Medicare (Managed Care) | Source: Ambulatory Visit | Attending: Internal Medicine | Admitting: Internal Medicine

## 2023-07-31 DIAGNOSIS — Z1231 Encounter for screening mammogram for malignant neoplasm of breast: Secondary | ICD-10-CM

## 2023-08-01 ENCOUNTER — Encounter: Payer: Self-pay | Admitting: Internal Medicine

## 2023-08-06 ENCOUNTER — Other Ambulatory Visit: Payer: Self-pay | Admitting: Internal Medicine

## 2023-08-06 DIAGNOSIS — N6320 Unspecified lump in the left breast, unspecified quadrant: Secondary | ICD-10-CM

## 2023-08-07 ENCOUNTER — Other Ambulatory Visit: Payer: Self-pay | Admitting: Internal Medicine

## 2023-08-07 DIAGNOSIS — M48061 Spinal stenosis, lumbar region without neurogenic claudication: Secondary | ICD-10-CM

## 2023-08-21 ENCOUNTER — Ambulatory Visit: Payer: Medicare (Managed Care)

## 2023-08-27 ENCOUNTER — Other Ambulatory Visit: Payer: Medicare (Managed Care)

## 2023-08-28 ENCOUNTER — Ambulatory Visit
Admission: RE | Admit: 2023-08-28 | Discharge: 2023-08-28 | Disposition: A | Discharge status: Home / Self Care | Payer: Medicare (Managed Care) | Source: Ambulatory Visit | Attending: Internal Medicine | Admitting: Internal Medicine

## 2023-08-28 DIAGNOSIS — N6320 Unspecified lump in the left breast, unspecified quadrant: Secondary | ICD-10-CM

## 2023-08-30 ENCOUNTER — Other Ambulatory Visit: Payer: Medicare (Managed Care)

## 2023-09-25 ENCOUNTER — Ambulatory Visit
Admission: RE | Admit: 2023-09-25 | Discharge: 2023-09-25 | Disposition: A | Discharge status: Home / Self Care | Payer: Medicare (Managed Care) | Source: Ambulatory Visit | Attending: Internal Medicine | Admitting: Internal Medicine

## 2023-09-25 DIAGNOSIS — M48061 Spinal stenosis, lumbar region without neurogenic claudication: Secondary | ICD-10-CM

## 2023-09-25 MED ORDER — GADOPICLENOL 0.5 MMOL/ML IV SOLN
6.5000 mL | Freq: Once | INTRAVENOUS | Status: AC | PRN
  Administered 2023-09-25: 6.5 mL via INTRAVENOUS

## 2023-11-13 ENCOUNTER — Ambulatory Visit (HOSPITAL_COMMUNITY)
Admission: RE | Admit: 2023-11-13 | Discharge: 2023-11-13 | Disposition: A | Discharge status: Home / Self Care | Payer: Medicare (Managed Care) | Source: Ambulatory Visit | Attending: Internal Medicine | Admitting: Internal Medicine

## 2023-11-13 ENCOUNTER — Other Ambulatory Visit (HOSPITAL_COMMUNITY): Payer: Self-pay | Admitting: Internal Medicine

## 2023-11-13 ENCOUNTER — Encounter (HOSPITAL_COMMUNITY): Payer: Self-pay

## 2023-11-13 ENCOUNTER — Ambulatory Visit (HOSPITAL_COMMUNITY): Payer: Medicare (Managed Care) | Attending: Vascular Surgery

## 2023-11-13 DIAGNOSIS — M7989 Other specified soft tissue disorders: Secondary | ICD-10-CM

## 2023-12-13 ENCOUNTER — Emergency Department (HOSPITAL_COMMUNITY): Payer: Medicare (Managed Care)

## 2023-12-13 ENCOUNTER — Emergency Department (HOSPITAL_COMMUNITY): Payer: Medicare (Managed Care) | Admitting: Registered Nurse

## 2023-12-13 ENCOUNTER — Inpatient Hospital Stay (HOSPITAL_COMMUNITY)
Admission: EM | Admit: 2023-12-13 | Discharge: 2023-12-18 | DRG: 023 | Disposition: A | Discharge status: Home Health | Payer: Medicare (Managed Care) | Attending: Neurology | Admitting: Neurology

## 2023-12-13 ENCOUNTER — Encounter (HOSPITAL_COMMUNITY)
Admission: EM | Disposition: A | Discharge status: Home Health | Payer: Self-pay | Source: Home / Self Care | Attending: Neurology

## 2023-12-13 DIAGNOSIS — I63512 Cerebral infarction due to unspecified occlusion or stenosis of left middle cerebral artery: Secondary | ICD-10-CM | POA: Diagnosis present

## 2023-12-13 DIAGNOSIS — Z803 Family history of malignant neoplasm of breast: Secondary | ICD-10-CM

## 2023-12-13 DIAGNOSIS — R29710 NIHSS score 10: Secondary | ICD-10-CM | POA: Diagnosis not present

## 2023-12-13 DIAGNOSIS — G4733 Obstructive sleep apnea (adult) (pediatric): Secondary | ICD-10-CM | POA: Diagnosis not present

## 2023-12-13 DIAGNOSIS — D631 Anemia in chronic kidney disease: Secondary | ICD-10-CM | POA: Diagnosis present

## 2023-12-13 DIAGNOSIS — Z8042 Family history of malignant neoplasm of prostate: Secondary | ICD-10-CM

## 2023-12-13 DIAGNOSIS — I669 Occlusion and stenosis of unspecified cerebral artery: Secondary | ICD-10-CM

## 2023-12-13 DIAGNOSIS — Z66 Do not resuscitate: Secondary | ICD-10-CM | POA: Diagnosis present

## 2023-12-13 DIAGNOSIS — Z833 Family history of diabetes mellitus: Secondary | ICD-10-CM

## 2023-12-13 DIAGNOSIS — I6389 Other cerebral infarction: Secondary | ICD-10-CM | POA: Diagnosis not present

## 2023-12-13 DIAGNOSIS — N1831 Chronic kidney disease, stage 3a: Secondary | ICD-10-CM | POA: Diagnosis present

## 2023-12-13 DIAGNOSIS — E1122 Type 2 diabetes mellitus with diabetic chronic kidney disease: Secondary | ICD-10-CM | POA: Diagnosis present

## 2023-12-13 DIAGNOSIS — Z982 Presence of cerebrospinal fluid drainage device: Secondary | ICD-10-CM | POA: Diagnosis not present

## 2023-12-13 DIAGNOSIS — Z955 Presence of coronary angioplasty implant and graft: Secondary | ICD-10-CM

## 2023-12-13 DIAGNOSIS — I129 Hypertensive chronic kidney disease with stage 1 through stage 4 chronic kidney disease, or unspecified chronic kidney disease: Secondary | ICD-10-CM | POA: Diagnosis present

## 2023-12-13 DIAGNOSIS — Z888 Allergy status to other drugs, medicaments and biological substances status: Secondary | ICD-10-CM

## 2023-12-13 DIAGNOSIS — E669 Obesity, unspecified: Secondary | ICD-10-CM | POA: Diagnosis present

## 2023-12-13 DIAGNOSIS — R29702 NIHSS score 2: Secondary | ICD-10-CM | POA: Diagnosis not present

## 2023-12-13 DIAGNOSIS — Z881 Allergy status to other antibiotic agents status: Secondary | ICD-10-CM

## 2023-12-13 DIAGNOSIS — R29810 Facial weakness: Secondary | ICD-10-CM | POA: Diagnosis present

## 2023-12-13 DIAGNOSIS — R270 Ataxia, unspecified: Secondary | ICD-10-CM | POA: Diagnosis present

## 2023-12-13 DIAGNOSIS — I2510 Atherosclerotic heart disease of native coronary artery without angina pectoris: Secondary | ICD-10-CM | POA: Diagnosis present

## 2023-12-13 DIAGNOSIS — Z808 Family history of malignant neoplasm of other organs or systems: Secondary | ICD-10-CM

## 2023-12-13 DIAGNOSIS — I213 ST elevation (STEMI) myocardial infarction of unspecified site: Secondary | ICD-10-CM | POA: Diagnosis present

## 2023-12-13 DIAGNOSIS — G8191 Hemiplegia, unspecified affecting right dominant side: Secondary | ICD-10-CM | POA: Diagnosis present

## 2023-12-13 DIAGNOSIS — Z91011 Allergy to milk products: Secondary | ICD-10-CM

## 2023-12-13 DIAGNOSIS — R29720 NIHSS score 20: Secondary | ICD-10-CM | POA: Diagnosis present

## 2023-12-13 DIAGNOSIS — J45909 Unspecified asthma, uncomplicated: Secondary | ICD-10-CM

## 2023-12-13 DIAGNOSIS — R471 Dysarthria and anarthria: Secondary | ICD-10-CM | POA: Diagnosis present

## 2023-12-13 DIAGNOSIS — I63412 Cerebral infarction due to embolism of left middle cerebral artery: Principal | ICD-10-CM | POA: Diagnosis present

## 2023-12-13 DIAGNOSIS — R29714 NIHSS score 14: Secondary | ICD-10-CM | POA: Diagnosis not present

## 2023-12-13 DIAGNOSIS — Z83438 Family history of other disorder of lipoprotein metabolism and other lipidemia: Secondary | ICD-10-CM

## 2023-12-13 DIAGNOSIS — R29704 NIHSS score 4: Secondary | ICD-10-CM | POA: Diagnosis not present

## 2023-12-13 DIAGNOSIS — M797 Fibromyalgia: Secondary | ICD-10-CM | POA: Diagnosis present

## 2023-12-13 DIAGNOSIS — R4701 Aphasia: Secondary | ICD-10-CM | POA: Diagnosis present

## 2023-12-13 DIAGNOSIS — Z8673 Personal history of transient ischemic attack (TIA), and cerebral infarction without residual deficits: Secondary | ICD-10-CM

## 2023-12-13 DIAGNOSIS — E785 Hyperlipidemia, unspecified: Secondary | ICD-10-CM | POA: Diagnosis present

## 2023-12-13 DIAGNOSIS — I639 Cerebral infarction, unspecified: Principal | ICD-10-CM

## 2023-12-13 DIAGNOSIS — I10 Essential (primary) hypertension: Secondary | ICD-10-CM

## 2023-12-13 DIAGNOSIS — Z6831 Body mass index (BMI) 31.0-31.9, adult: Secondary | ICD-10-CM

## 2023-12-13 DIAGNOSIS — Z8249 Family history of ischemic heart disease and other diseases of the circulatory system: Secondary | ICD-10-CM

## 2023-12-13 DIAGNOSIS — Z7982 Long term (current) use of aspirin: Secondary | ICD-10-CM

## 2023-12-13 DIAGNOSIS — Z823 Family history of stroke: Secondary | ICD-10-CM

## 2023-12-13 DIAGNOSIS — Z882 Allergy status to sulfonamides status: Secondary | ICD-10-CM

## 2023-12-13 DIAGNOSIS — Z90710 Acquired absence of both cervix and uterus: Secondary | ICD-10-CM

## 2023-12-13 DIAGNOSIS — Z9842 Cataract extraction status, left eye: Secondary | ICD-10-CM

## 2023-12-13 DIAGNOSIS — Z9841 Cataract extraction status, right eye: Secondary | ICD-10-CM

## 2023-12-13 DIAGNOSIS — E1151 Type 2 diabetes mellitus with diabetic peripheral angiopathy without gangrene: Secondary | ICD-10-CM | POA: Diagnosis present

## 2023-12-13 DIAGNOSIS — I6602 Occlusion and stenosis of left middle cerebral artery: Secondary | ICD-10-CM | POA: Diagnosis present

## 2023-12-13 DIAGNOSIS — R300 Dysuria: Secondary | ICD-10-CM | POA: Diagnosis present

## 2023-12-13 DIAGNOSIS — Z885 Allergy status to narcotic agent status: Secondary | ICD-10-CM

## 2023-12-13 HISTORY — PX: IR CT HEAD LTD: IMG2386

## 2023-12-13 HISTORY — PX: IR PERCUTANEOUS ART THROMBECTOMY/INFUSION INTRACRANIAL INC DIAG ANGIO: IMG6087

## 2023-12-13 HISTORY — PX: RADIOLOGY WITH ANESTHESIA: SHX6223

## 2023-12-13 LAB — I-STAT CHEM 8, ED
BUN: 23 mg/dL (ref 8–23)
Calcium, Ion: 1.08 mmol/L — ABNORMAL LOW (ref 1.15–1.40)
Chloride: 104 mmol/L (ref 98–111)
Creatinine, Ser: 1 mg/dL (ref 0.44–1.00)
Glucose, Bld: 137 mg/dL — ABNORMAL HIGH (ref 70–99)
HCT: 37 % (ref 36.0–46.0)
Hemoglobin: 12.6 g/dL (ref 12.0–15.0)
Potassium: 4.3 mmol/L (ref 3.5–5.1)
Sodium: 134 mmol/L — ABNORMAL LOW (ref 135–145)
TCO2: 22 mmol/L (ref 22–32)

## 2023-12-13 LAB — COMPREHENSIVE METABOLIC PANEL
ALT: 34 U/L (ref 0–44)
AST: 75 U/L — ABNORMAL HIGH (ref 15–41)
Albumin: 3.4 g/dL — ABNORMAL LOW (ref 3.5–5.0)
Alkaline Phosphatase: 54 U/L (ref 38–126)
Anion gap: 12 (ref 5–15)
BUN: 20 mg/dL (ref 8–23)
CO2: 23 mmol/L (ref 22–32)
Calcium: 9 mg/dL (ref 8.9–10.3)
Chloride: 99 mmol/L (ref 98–111)
Creatinine, Ser: 0.95 mg/dL (ref 0.44–1.00)
GFR, Estimated: 59 mL/min — ABNORMAL LOW (ref >60)
Glucose, Bld: 140 mg/dL — ABNORMAL HIGH (ref 70–99)
Potassium: 4.6 mmol/L (ref 3.5–5.1)
Sodium: 134 mmol/L — ABNORMAL LOW (ref 135–145)
Total Bilirubin: 0.6 mg/dL (ref 0.0–1.2)
Total Protein: 6.3 g/dL — ABNORMAL LOW (ref 6.5–8.1)

## 2023-12-13 LAB — DIFFERENTIAL
Abs Immature Granulocytes: 0.03 10*3/uL (ref 0.00–0.07)
Basophils Absolute: 0 10*3/uL (ref 0.0–0.1)
Basophils Relative: 0 %
Eosinophils Absolute: 0.2 10*3/uL (ref 0.0–0.5)
Eosinophils Relative: 2 %
Immature Granulocytes: 0 %
Lymphocytes Relative: 11 %
Lymphs Abs: 1.1 10*3/uL (ref 0.7–4.0)
Monocytes Absolute: 0.8 10*3/uL (ref 0.1–1.0)
Monocytes Relative: 8 %
Neutro Abs: 7.6 10*3/uL (ref 1.7–7.7)
Neutrophils Relative %: 79 %

## 2023-12-13 LAB — CBC
HCT: 36.5 % (ref 36.0–46.0)
Hemoglobin: 12.1 g/dL (ref 12.0–15.0)
MCH: 30.1 pg (ref 26.0–34.0)
MCHC: 33.2 g/dL (ref 30.0–36.0)
MCV: 90.8 fL (ref 80.0–100.0)
Platelets: 154 10*3/uL (ref 150–400)
RBC: 4.02 MIL/uL (ref 3.87–5.11)
RDW: 13.2 % (ref 11.5–15.5)
WBC: 9.8 10*3/uL (ref 4.0–10.5)
nRBC: 0 % (ref 0.0–0.2)

## 2023-12-13 LAB — PROTIME-INR
INR: 1 (ref 0.8–1.2)
Prothrombin Time: 13.2 s (ref 11.4–15.2)

## 2023-12-13 LAB — HEMOGLOBIN A1C
Hgb A1c MFr Bld: 6.6 % — ABNORMAL HIGH (ref 4.8–5.6)
Mean Plasma Glucose: 142.72 mg/dL

## 2023-12-13 LAB — APTT: aPTT: 27 s (ref 24–36)

## 2023-12-13 LAB — ETHANOL: Alcohol, Ethyl (B): <10 mg/dL (ref <10)

## 2023-12-13 LAB — CBG MONITORING, ED: Glucose-Capillary: 138 mg/dL — ABNORMAL HIGH (ref 70–99)

## 2023-12-13 SURGERY — RADIOLOGY WITH ANESTHESIA
Anesthesia: General

## 2023-12-13 MED ORDER — NITROGLYCERIN 1 MG/10 ML FOR IR/CATH LAB
INTRA_ARTERIAL | Status: AC
Start: 2023-12-13 — End: ?
  Filled 2023-12-13: qty 10

## 2023-12-13 MED ORDER — ALBUMIN HUMAN 5 % IV SOLN: INTRAVENOUS | Status: DC | PRN

## 2023-12-13 MED ORDER — ACETAMINOPHEN 650 MG RE SUPP: 650.0000 mg | RECTAL | Status: DC | PRN

## 2023-12-13 MED ORDER — CLEVIDIPINE BUTYRATE 0.5 MG/ML IV EMUL: 0.0000 mg/h | INTRAVENOUS | Status: DC

## 2023-12-13 MED ORDER — SENNOSIDES-DOCUSATE SODIUM 8.6-50 MG PO TABS
1.0000 | ORAL_TABLET | Freq: Every evening | ORAL | Status: DC | PRN
  Administered 2023-12-15: 1 via ORAL
  Filled 2023-12-13: qty 1

## 2023-12-13 MED ORDER — LIDOCAINE 2% (20 MG/ML) 5 ML SYRINGE
INTRAMUSCULAR | Status: DC | PRN
  Administered 2023-12-13: 60 mg via INTRAVENOUS

## 2023-12-13 MED ORDER — PANTOPRAZOLE SODIUM 40 MG IV SOLR: 40.0000 mg | Freq: Every day | INTRAVENOUS | Status: DC

## 2023-12-13 MED ORDER — ACETAMINOPHEN 160 MG/5ML PO SOLN: 650.0000 mg | ORAL | Status: DC | PRN

## 2023-12-13 MED ORDER — ACETAMINOPHEN 325 MG PO TABS: 650.0000 mg | ORAL_TABLET | ORAL | Status: DC | PRN

## 2023-12-13 MED ORDER — PROPOFOL 10 MG/ML IV BOLUS
INTRAVENOUS | Status: DC | PRN
  Administered 2023-12-13: 30 mg via INTRAVENOUS
  Administered 2023-12-13: 130 mg via INTRAVENOUS

## 2023-12-13 MED ORDER — SODIUM CHLORIDE 0.9 % IV SOLN: INTRAVENOUS | Status: DC

## 2023-12-13 MED ORDER — SUGAMMADEX SODIUM 200 MG/2ML IV SOLN
INTRAVENOUS | Status: DC | PRN
  Administered 2023-12-13 (×2): 100 mg via INTRAVENOUS

## 2023-12-13 MED ORDER — IOHEXOL 350 MG/ML SOLN
75.0000 mL | Freq: Once | INTRAVENOUS | Status: AC | PRN
  Administered 2023-12-13: 75 mL via INTRAVENOUS

## 2023-12-13 MED ORDER — STROKE: EARLY STAGES OF RECOVERY BOOK
Freq: Once | Status: AC
  Filled 2023-12-13: qty 1

## 2023-12-13 MED ORDER — PHENYLEPHRINE HCL-NACL 20-0.9 MG/250ML-% IV SOLN
INTRAVENOUS | Status: DC | PRN
  Administered 2023-12-13: 30 ug/min via INTRAVENOUS

## 2023-12-13 MED ORDER — FENTANYL CITRATE (PF) 100 MCG/2ML IJ SOLN
INTRAMUSCULAR | Status: AC
  Filled 2023-12-13: qty 2

## 2023-12-13 MED ORDER — SUCCINYLCHOLINE CHLORIDE 200 MG/10ML IV SOSY
PREFILLED_SYRINGE | INTRAVENOUS | Status: DC | PRN
  Administered 2023-12-13: 100 mg via INTRAVENOUS

## 2023-12-13 MED ORDER — LACTATED RINGERS IV SOLN: INTRAVENOUS | Status: DC | PRN

## 2023-12-13 MED ORDER — TENECTEPLASE FOR STROKE
0.2500 mg/kg | PACK | Freq: Once | INTRAVENOUS | Status: AC
  Administered 2023-12-13: 19 mg via INTRAVENOUS
  Filled 2023-12-13: qty 10

## 2023-12-13 MED ORDER — SODIUM CHLORIDE (PF) 0.9 % IJ SOLN
INTRAVENOUS | Status: AC | PRN
  Administered 2023-12-13 (×5): 25 ug via INTRA_ARTERIAL

## 2023-12-13 MED ORDER — IOHEXOL 300 MG/ML  SOLN
150.0000 mL | Freq: Once | INTRAMUSCULAR | Status: AC | PRN
  Administered 2023-12-13: 80 mL via INTRA_ARTERIAL

## 2023-12-13 MED ORDER — FENTANYL CITRATE (PF) 250 MCG/5ML IJ SOLN
INTRAMUSCULAR | Status: DC | PRN
  Administered 2023-12-13 (×2): 50 ug via INTRAVENOUS

## 2023-12-13 MED ORDER — SODIUM CHLORIDE 0.9 % IV SOLN: INTRAVENOUS | Status: DC | PRN

## 2023-12-13 MED ORDER — TENECTEPLASE FOR STROKE
0.2500 mg/kg | PACK | Freq: Once | INTRAVENOUS | Status: DC
  Filled 2023-12-13: qty 10

## 2023-12-13 MED ORDER — ROCURONIUM BROMIDE 10 MG/ML (PF) SYRINGE
PREFILLED_SYRINGE | INTRAVENOUS | Status: DC | PRN
  Administered 2023-12-13: 50 mg via INTRAVENOUS

## 2023-12-13 MED ORDER — PHENYLEPHRINE 80 MCG/ML (10ML) SYRINGE FOR IV PUSH (FOR BLOOD PRESSURE SUPPORT)
PREFILLED_SYRINGE | INTRAVENOUS | Status: DC | PRN
  Administered 2023-12-13: 80 ug via INTRAVENOUS
  Administered 2023-12-13: 160 ug via INTRAVENOUS
  Administered 2023-12-13: 80 ug via INTRAVENOUS

## 2023-12-13 MED ORDER — ONDANSETRON HCL 4 MG/2ML IJ SOLN
INTRAMUSCULAR | Status: DC | PRN
  Administered 2023-12-13: 4 mg via INTRAVENOUS

## 2023-12-13 MED ORDER — EPHEDRINE SULFATE-NACL 50-0.9 MG/10ML-% IV SOSY
PREFILLED_SYRINGE | INTRAVENOUS | Status: DC | PRN
Start: 2023-12-13 — End: 2023-12-14
  Administered 2023-12-13 (×3): 5 mg via INTRAVENOUS

## 2023-12-13 NOTE — Procedures (Signed)
 INR.  Status post left common carotid arteriogram.  Findings.  1.  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  Status post complete revascularization of occluded distal left MCA M1 segment and origin of the superior division with 1 pass with an 062 contact  aspiration catheter with a 4 mm x 40 mm solitaire X device achieving a TICI 3 revascularization.  Post CT brain no evidence of intracranial hemorrhage.  8 French Angio-Seal closure device deployed for hemostasis at the right groin puncture site.  Distal pulses all dopplerable bilaterally unchanged.  Patient extubated.  Opens eyes to commands.  Pupils 2 mm bilaterally sluggishly reactive.  No facial asymmetry.  Moves left arm and left leg spontaneously.  No significant movement of the right upper extremity.  Fatima Sanger MD.

## 2023-12-13 NOTE — H&P (Signed)
 NEUROLOGY CONSULT NOTE   Date of service: December 13, 2023 Patient Name: Brittany Atkins MRN:  [id number] DOB:  05-09-1936 Chief Complaint: "Aphasia, right-sided weakness" Requesting Provider: Milon Dikes, MD  History of Present Illness  Brittany Atkins is a 88 y.o. female with hx of diabetes, chronic pain, fibromyalgia, prior history of TIA without residual deficits, headache, hypertension, peripheral arterial disease, sleep apnea, history of a VP shunt-presented to the emergency department for evaluation of right-sided weakness and aphasia as a code stroke. Patient was recently discharged from [facility] hospital after getting a cardiac catheterization for STEMI, and got home this afternoon and was supposed to be on DAPT.  Did not take any of the medicines at home yet.Marland Kitchen  She was on the toilet when all of a sudden family noted that she is having right-sided weakness and not able to talk.  EMS was called-they brought the patient in for emergent evaluation with an LVO score in the field of 5. Noncontrasted head CT unremarkable Call made to the daughter over the phone-risks benefits and alternatives of TNK discussed and TNK administered.  Clinical presentation concerning for ELVO.  CT angiography head and neck completed right after TNK-that revealed a distal left M1 occlusion at the bifurcation along with an M2 branch occlusion. Deemed candidate for interventional radiology assisted thrombectomy but immediate activation of IR code stroke not done at the request of interventionalist due to another patient being on the table.  Case discussed with the interventionalist who asked for about 20 to 30 minutes to see if they can be done with the other case on time, so that we can avoid a potential transfer to an outside facility which might take more than an hour anyway.  Decision made to wait 20 to 30 minutes, and if the current case on the table still required more time, then pursue  transfer.  Once the interventionalist was approaching completion of the other case, IR code stroke activated and patient taken for thrombectomy after discussion of risks benefits and alternatives with the daughter over the phone again.   Review of the discharge summary from the outside hospital-admitted to the outside hospital 12/11/2023, discharged today 12/13/2023.  Discharge diagnoses-ST elevation myocardial infarction, status post drug-eluting stent of the left circumflex.  She presented to that hospital with chest pain, found to have a STEMI.  Cath Lab was activated.  Admitted after left heart catheterization with drug-eluting stent placed in the left circumflex.  Postprocedure she was transferred to CCU for observation where she remained stable, then she ambulated without any complaints and was discharged home.  Discharged home with aspirin and Brilinta.  LKW: 7:30 PM Modified rankin score: 1-No significant post stroke disability and can perform usual duties with stroke symptoms IV Thrombolysis: Yes EVT-yes NIH stroke scale-20   ROS  Unable to ascertain due to her aphasia  Past History   Past Medical History:  Diagnosis Date   Allergy    seasonal   Anxiety    Asthma with allergic rhinitis    Cataract    surgery   Cervical radiculopathy    Chronic kidney disease    ladder is not filling up/ usually incontinent   Chronic pain    Diabetes mellitus without complication (HCC)    Fibromyalgia    GERD (gastroesophageal reflux disease)    H/O TIA (transient ischemic attack) and stroke    Headache    HLD (hyperlipidemia)    Hypertension    IBS (irritable bowel syndrome)  Insomnia    OSA (obstructive sleep apnea)    Osteoarthritis    PAD (peripheral artery disease) (HCC)    Positive PPD    Sleep apnea    doesn't use her CPAP   Stroke (HCC)    TIA's; MAy 2017   Thyroid disease    Tuberculosis    positive tb tests all of her life/ 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    Past Surgical History:   Procedure Laterality Date   brain shunt     hydrocephalis   CATARACT EXTRACTION, BILATERAL  2014   HIP SURGERY  2009   hip muscle   HYDROCELE EXCISION / REPAIR  07/2010   LAPAROSCOPIC ABDOMINAL EXPLORATION N/A 02/07/2018   Procedure: LAPAROSCOPIC ABDOMINAL EXPLORATION;  Surgeon: Ovidio Kin, MD;  Location: [facility];  Service: General;  Laterality: N/A;  possible laparoscopic appendectomy, need pathology report   LUMBAR LAMINECTOMY  2009   OVARIAN CYST REMOVAL     with  fallopian tube   TOTAL ABDOMINAL HYSTERECTOMY      Family History: Family History  Problem Relation Age of Onset   Hypertension Mother    Hyperlipidemia Mother    Heart disease Mother    Breast cancer Mother    Diabetes Father    Hypertension Father    Stroke Father    Prostate cancer Father    Diabetes Sister    Diabetes Brother        x 2   Heart attack Maternal Grandmother    Heart attack Maternal Grandfather    Thyroid cancer Paternal Grandmother    Hyperthyroidism Maternal Aunt        x 2    Social History  reports that she has never smoked. She has never used smokeless tobacco. She reports that she does not drink alcohol and does not use drugs.  Allergies  Allergen Reactions   Cefdinir    Clindamycin/Lincomycin    Codeine Nausea Only   Cymbalta [Duloxetine Hcl]    Sulfamethizole    Oxycodone Rash    Medications   Current Facility-Administered Medications:    [START ON 12/14/2023]  stroke: early stages of recovery book, , Does not apply, Once, Milon Dikes, MD   0.9 %  sodium chloride infusion, , Intravenous, Continuous, Milon Dikes, MD   acetaminophen (TYLENOL) tablet 650 mg, 650 mg, Oral, Q4H PRN **OR** acetaminophen (TYLENOL) 160 MG/5ML solution 650 mg, 650 mg, Per Tube, Q4H PRN **OR** acetaminophen (TYLENOL) suppository 650 mg, 650 mg, Rectal, Q4H PRN, Milon Dikes, MD   pantoprazole (PROTONIX) injection 40 mg, 40 mg, Intravenous, QHS, Milon Dikes, MD   senna-docusate (Senokot-S)  tablet 1 tablet, 1 tablet, Oral, QHS PRN, Milon Dikes, MD  Current Outpatient Medications:    acetaminophen (TYLENOL) 500 MG tablet, Take 500-1,000 mg by mouth every 6 (six) hours as needed for moderate pain., Disp: , Rfl:    acyclovir (ZOVIRAX) 400 MG tablet, Take 400 mg by mouth 5 (five) times daily as needed (fever blisters)., Disp: , Rfl:    Artificial Saliva (BIOTENE MOISTURIZING MOUTH MT), Use as directed 1 spray in the mouth or throat every 4 (four) hours as needed (dry mouth)., Disp: , Rfl:    aspirin 81 MG tablet, Take 81 mg by mouth daily., Disp: , Rfl:    atorvastatin (LIPITOR) 40 MG tablet, Take 40 mg by mouth daily., Disp: , Rfl:    cetirizine (ZYRTEC) 10 MG tablet, Take 10 mg by mouth daily., Disp: , Rfl:    citalopram (CELEXA) 10 MG  tablet, Take 10 mg by mouth daily., Disp: , Rfl:    dicyclomine (BENTYL) 10 MG capsule, Take 1-2 capsules (10-20 mg total) by mouth every 8 (eight) hours as needed for spasms. (Patient taking differently: Take 10-20 mg by mouth in the morning, at noon, in the evening, and at bedtime. ), Disp: 90 capsule, Rfl: 3   fluticasone (FLONASE) 50 MCG/ACT nasal spray, Place into both nostrils daily., Disp: , Rfl:    gabapentin (NEURONTIN) 300 MG capsule, Take 300-600 mg by mouth 2 (two) times daily. Take 2 capsules (600 mg) in the morning and Take 1 capsule (300 mg) in the evening, Disp: , Rfl:    guaiFENesin (ROBITUSSIN) 100 MG/5ML liquid, Take 200 mg by mouth 3 (three) times daily as needed for cough., Disp: , Rfl:    ketotifen (ZADITOR) 0.025 % ophthalmic solution, Place 1 drop into both eyes every 8 (eight) hours as needed (irritation)., Disp: , Rfl:    Lactase (LACTAID FAST ACT) 9000 units TABS, Take 1 tablet by mouth 3 (three) times daily as needed (lactose intolerance)., Disp: , Rfl:    Lactobacillus (ACIDOPHILUS) CAPS capsule, Take 2 capsules by mouth daily., Disp: , Rfl:    Lidocaine HCl (ASPERCREME LIDOCAINE) 4 % CREA, Apply 1 application topically 3  (three) times daily as needed (pain)., Disp: , Rfl:    losartan-hydrochlorothiazide (HYZAAR) 100-25 MG tablet, Take 1 tablet by mouth every evening., Disp: , Rfl:    Melatonin 10 MG CAPS, Take 1 capsule by mouth at bedtime., Disp: , Rfl:    nystatin (NYSTATIN) powder, Apply 1 application topically 3 (three) times daily as needed (skin irritation)., Disp: , Rfl:    ondansetron (ZOFRAN ODT) 4 MG disintegrating tablet, Take 1 tablet (4 mg total) by mouth every 6 (six) hours as needed for nausea or vomiting. (Patient not taking: Reported on 07/05/2020), Disp: 90 tablet, Rfl: 3   potassium chloride SA (KLOR-CON) 20 MEQ tablet, Take 1 tablet (20 mEq total) by mouth daily. (Patient not taking: Reported on 07/05/2020), Disp: 3 tablet, Rfl: 0   primidone (MYSOLINE) 50 MG tablet, Take 50 mg by mouth at bedtime. , Disp: , Rfl:    sennosides-docusate sodium (SENOKOT-S) 8.6-50 MG tablet, Take 2 tablets by mouth 2 (two) times daily as needed for constipation., Disp: , Rfl:    traMADol (ULTRAM) 50 MG tablet, Take 50 mg by mouth 3 (three) times daily as needed for moderate pain. , Disp: , Rfl:   Facility-Administered Medications Ordered in Other Encounters:    0.9 %  sodium chloride infusion, , Intravenous, Continuous PRN, Lillette Boxer C., CRNA, Stopped at 12/13/23 2254   albumin human 5 % solution, , Intravenous, Continuous PRN, Lillette Boxer C., CRNA, Stopped at 12/13/23 2302   ePHEDrine sulfate (PF) 5mg /mL syringe, , Intravenous, Anesthesia Intra-op, Lillette Boxer C., CRNA, 5 mg at 12/13/23 2231   fentaNYL citrate (PF) (SUBLIMAZE) injection, , Intravenous, Anesthesia Intra-op, Lillette Boxer C., CRNA, 50 mcg at 12/13/23 2152   lactated ringers infusion, , Intravenous, Continuous PRN, Laruth Bouchard., CRNA, New Bag at 12/13/23 2130   lidocaine 2% (20 mg/mL) 5 mL syringe, , Intravenous, Anesthesia Intra-op, Lillette Boxer C., CRNA, 60 mg at 12/13/23 2128   ondansetron (ZOFRAN) injection, , Intravenous, Anesthesia  Intra-op, Lillette Boxer C., CRNA, 4 mg at 12/13/23 2257   phenylephrine (NEO-SYNEPHRINE) 20mg /NS premix infusion, , Intravenous, Continuous PRN, Laruth Bouchard., CRNA, Stopped at 12/13/23 2302   PHENYLephrine 80 mcg/ml in normal saline Adult IV Push Syringe (  For Blood Pressure Support), , Intravenous, Anesthesia Intra-op, Lillette Boxer C., CRNA, 160 mcg at 2024/01/10 2230   propofol (DIPRIVAN) 10 mg/mL bolus/IV push, , Intravenous, Anesthesia Intra-op, Lillette Boxer C., CRNA, 30 mg at January 10, 2024 2253   rocuronium (ZEMURON) injection, , Intravenous, Anesthesia Intra-op, Lillette Boxer C., CRNA, 50 mg at 2024/01/10 2142   succinylcholine (ANECTINE) syringe, , Intravenous, Anesthesia Intra-op, Lillette Boxer C., CRNA, 100 mg at 2024-01-10 2128   sugammadex sodium (BRIDION) injection, , Intravenous, Anesthesia Intra-op, Lillette Boxer C., CRNA, 100 mg at 01-10-24 2301  Vitals   Vitals:   01-10-2024 2000 01/10/2024 2008 2024-01-10 2024 Jan 10, 2024 2045  BP:   130/66 (!) 140/62  Pulse:   78   Resp:   17   Temp:    97.7 F (36.5 C)  TempSrc:    Oral  SpO2:   97%   Weight: 76 kg 76 kg    Height:  5\' 2"  (1.575 m)      Body mass index is 30.65 kg/m.  Physical Exam  General: Well-developed well-nourished HEENT: Normocephalic/atraumatic Chest: Clear Cardiovascular: Regular rhythm Abdomen nondistended nontender Neurological exam She appears awake alert. She is able to mumble some words that are completely incomprehensible She attempts to follow commands but is not able to completely follow commands-when I asked her to open her to stick her tongue out, she opens her mouth but does not stick her tongue out. Cranial nerves: Pupils are equal round react light, has somewhat of a left gaze preference but is able to cross the midline to look to the right, questionable blink to threat from the right, able to blink to threat from the left consistently.  Right lower facial weakness at rest. Motor examination: Flaccid right  upper extremity and nearly flaccid right lower extremity.  Left-sided extremities seem to have full strength Sensation also appears diminished on the right side. Coordination difficult to assess given her inability to follow commands   Labs/Imaging/Neurodiagnostic studies   CBC:  Recent Labs  Lab 10-Jan-2024 2009 01/10/24 2031  WBC 9.8  --   NEUTROABS 7.6  --   HGB 12.1 12.6  HCT 36.5 37.0  MCV 90.8  --   PLT 154  --    Basic Metabolic Panel:  Lab Results  Component Value Date   NA 134 (L) January 10, 2024   K 4.3 01/10/2024   CO2 23 10-Jan-2024   GLUCOSE 137 (H) 01/10/24   BUN 23 2024-01-10   CREATININE 1.00 10-Jan-2024   CALCIUM 9.0 2024/01/10   GFRNONAA 59 (L) 10-Jan-2024   GFRAA >60 08/22/2019   Lipid Panel:  Lab Results  Component Value Date   LDLCALC 93 07/06/2020   HgbA1c:  Lab Results  Component Value Date   HGBA1C 6.6 (H) 2024/01/10   Urine Drug Screen:     Component Value Date/Time   LABOPIA NONE DETECTED 07/05/2020 1841   COCAINSCRNUR NONE DETECTED 07/05/2020 1841   LABBENZ NONE DETECTED 07/05/2020 1841   AMPHETMU NONE DETECTED 07/05/2020 1841   THCU NONE DETECTED 07/05/2020 1841   LABBARB POSITIVE (A) 07/05/2020 1841    Alcohol Level     Component Value Date/Time   ETH <10 2024-01-10 2009   INR  Lab Results  Component Value Date   INR 1.0 2024/01/10   APTT  Lab Results  Component Value Date   APTT 27 10-Jan-2024   2D echocardiogram from the outside hospital 12/12/2023: LVEF 60 to 65%, basal LV inferior lateral wall hypokinesis.  Right ventricle is normal in size  and function.  Atria are normal in size.  Aortic valve sclerosis.  Mild mitral regurgitation.  Mild tricuspid regurgitation.  IVC normal in size.  No pericardial effusion.  No prior study for comparison.  CT Head without contrast(Personally reviewed): Aspects 10.  Right parietal VP shunt  CT angio Head and Neck with contrast(Personally reviewed): Left distal M1 occlusion at the  bifurcation and a concomitant left M2 branch occlusion as well.   ASSESSMENT   Athziri Freundlich is a 88 y.o. female with past history as above presenting for evaluation of sudden onset of aphasia and right-sided weakness.  Examination consistent with left hemispheric stroke.  No evidence of bleed on noncontrasted CT.  Risk benefits alternatives of TNKase discussed with daughter over the phone-TNKase administered.  CT angiography performed due to high suspicion for this being an LVO which confirmed a left distal M1 occlusion at the bifurcation and a left M2 branch occlusion. Case discussed with interventionalist-who agreed that this needs an intervention but was at that time in the IR suite with another patient on the table.  Transferred to an outside institution would take over an hour-the timeline that the interventionalist gave me is that they might be done in 20 to 30 minutes.  Plan was to wait about 30 minutes and if they are not done still or if they think that they might be taking long, to transfer the patient to an outside facility for intervention but thankfully they were able to complete the other case and take her in for thrombectomy-page was sent out after confirmation that they are able to do the procedure. Etiology likely cardioembolic in the setting of recent MI  RECOMMENDATIONS  Admit to ICU Frequent neurochecks and vitals per post TNK and post IR Blood pressure goal-successfully revascularized vessel per the IR note-systolic blood pressure goal would be 120-140 for now. Telemetry Although she had a recent 2D echo, I would repeat an echocardiogram to make sure that there is no evidence of an LV thrombus given the recent MI. Gentle hydration Hold antiplatelets and anticoagulants for at least 24 hours from TNK.  Repeat imaging-MRI brain at 24 hours and if no bleed, resume antiplatelets. I have reached out to cardiology for recommendations on antiplatelets given the recent  PCI. Check labs in the morning Replete electrolytes as necessary  I personally checked CODE STATUS with the daughter-previously listed as DNR-still a DNR. DVT prophylaxis-SCDs only GI: PPI Bowel: Docusate senna  Present on arrival: Acute ischemic stroke, coronary artery disease with recent STEMI and PCI an outside hospital, aphasia, DNR  Updated and spoke with the daughter on multiple occasions and obtained consent over the phone: Details of the daughter below:  Nile Dear Daughter Emergency Contact [phone]   Addendum Discussed with on-call cardiology as curbside-ideally they would not want any interruption in DAPT but with TNK on board and post thrombectomy high risk of hemorrhage, okay to hold for now and resume as soon as outside the 24-hour TNK mark.   ______________________________________________________________________    Dene Gentry, MD [facility]   CRITICAL CARE ATTESTATION Performed by: Milon Dikes, MD Total critical care time: 80 minutes Critical care time was exclusive of separately billable procedures and treating other patients and/or supervising APPs/Residents/Students Critical care was necessary to treat or prevent imminent or life-threatening deterioration. This patient is critically ill and at significant risk for neurological worsening and/or death and care requires constant monitoring. Critical care was time spent personally by me on the following activities: development  of treatment plan with patient and/or surrogate as well as nursing, discussions with consultants, evaluation of patient's response to treatment, examination of patient, obtaining history from patient or surrogate, ordering and performing treatments and interventions, ordering and review of laboratory studies, ordering and review of radiographic studies, pulse oximetry, re-evaluation of patient's condition, participation in multidisciplinary rounds and medical  decision making of high complexity in the care of this patient.

## 2023-12-13 NOTE — ED Provider Notes (Signed)
 [place] EMERGENCY DEPARTMENT AT [facility] Provider Note   CSN: [id number] Arrival date & time: 12/13/23  2007  An emergency department physician performed an initial assessment on this suspected stroke patient at 2009.  History  No chief complaint on file.   Kaula Klenke is a 88 y.o. female.  With a history of VP shunt, type 2 diabetes and coronary artery disease status post stent placement and recent hospitalization who presents via EMS given concern for stroke.  Last known well time 1930.  Patient was discharged earlier today following admission for chest pain at [facility] during which a new stent was placed following MI.  Family noted new onset deficit of right-sided weakness and right-sided facial droop and dysarthria at 1930 tonight after she came out of the bathroom at home.  EMS was called and transported her to the ED.  Code stroke activated HPI     Home Medications Prior to Admission medications   Medication Sig Start Date End Date Taking? Authorizing Provider  acetaminophen (TYLENOL) 500 MG tablet Take 500-1,000 mg by mouth every 6 (six) hours as needed for moderate pain.    [provider]  acyclovir (ZOVIRAX) 400 MG tablet Take 400 mg by mouth 5 (five) times daily as needed (fever blisters).    [provider]  Artificial Saliva (BIOTENE MOISTURIZING MOUTH MT) Use as directed 1 spray in the mouth or throat every 4 (four) hours as needed (dry mouth).    [provider]  aspirin 81 MG tablet Take 81 mg by mouth daily.    [provider]  atorvastatin (LIPITOR) 40 MG tablet Take 40 mg by mouth daily.    [provider]  cetirizine (ZYRTEC) 10 MG tablet Take 10 mg by mouth daily.    [provider]  citalopram (CELEXA) 10 MG tablet Take 10 mg by mouth daily.    [provider]  dicyclomine (BENTYL) 10 MG capsule Take 1-2 capsules (10-20 mg total) by mouth every 8 (eight) hours  as needed for spasms. Patient taking differently: Take 10-20 mg by mouth in the morning, at noon, in the evening, and at bedtime.  11/09/16   Armbruster, Willaim Rayas, MD  fluticasone (FLONASE) 50 MCG/ACT nasal spray Place into both nostrils daily.    [provider]  gabapentin (NEURONTIN) 300 MG capsule Take 300-600 mg by mouth 2 (two) times daily. Take 2 capsules (600 mg) in the morning and Take 1 capsule (300 mg) in the evening    [provider]  guaiFENesin (ROBITUSSIN) 100 MG/5ML liquid Take 200 mg by mouth 3 (three) times daily as needed for cough.    [provider]  ketotifen (ZADITOR) 0.025 % ophthalmic solution Place 1 drop into both eyes every 8 (eight) hours as needed (irritation).    [provider]  Lactase (LACTAID FAST ACT) 9000 units TABS Take 1 tablet by mouth 3 (three) times daily as needed (lactose intolerance).    [provider]  Lactobacillus (ACIDOPHILUS) CAPS capsule Take 2 capsules by mouth daily.    [provider]  Lidocaine HCl (ASPERCREME LIDOCAINE) 4 % CREA Apply 1 application topically 3 (three) times daily as needed (pain).    [provider]  losartan-hydrochlorothiazide (HYZAAR) 100-25 MG tablet Take 1 tablet by mouth every evening.    [provider]  Melatonin 10 MG CAPS Take 1 capsule by mouth at bedtime.    [provider]  nystatin (NYSTATIN) powder Apply 1  application topically 3 (three) times daily as needed (skin irritation).    [provider]  ondansetron (ZOFRAN ODT) 4 MG disintegrating tablet Take 1 tablet (4 mg total) by mouth every 6 (six) hours as needed for nausea or vomiting. Patient not taking: Reported on 07/05/2020 11/09/16   Benancio Deeds, MD  potassium chloride SA (KLOR-CON) 20 MEQ tablet Take 1 tablet (20 mEq total) by mouth daily. Patient not taking: Reported on 07/05/2020 08/22/19   Rolan Bucco, MD  primidone (MYSOLINE) 50 MG tablet Take 50 mg by  mouth at bedtime.     [provider]  sennosides-docusate sodium (SENOKOT-S) 8.6-50 MG tablet Take 2 tablets by mouth 2 (two) times daily as needed for constipation.    [provider]  traMADol (ULTRAM) 50 MG tablet Take 50 mg by mouth 3 (three) times daily as needed for moderate pain.     [provider]      Allergies    Cefdinir, Clindamycin/lincomycin, Codeine, Cymbalta [duloxetine hcl], Sulfamethizole, and Oxycodone    Review of Systems   Review of Systems  Physical Exam Updated Vital Signs BP (!) 98/54 (BP Location: Right Arm)   Pulse 64   Temp (!) 97 F (36.1 C)   Resp 16   Ht 5\' 2"  (1.575 m)   Wt 76 kg   SpO2 97%   BMI 30.65 kg/m  Physical Exam Vitals and nursing note reviewed.  HENT:     Head: Normocephalic and atraumatic.  Eyes:     Pupils: Pupils are equal, round, and reactive to light.  Cardiovascular:     Rate and Rhythm: Normal rate and regular rhythm.  Pulmonary:     Effort: Pulmonary effort is normal.     Breath sounds: Normal breath sounds.  Abdominal:     Palpations: Abdomen is soft.     Tenderness: There is no abdominal tenderness.  Skin:    General: Skin is warm and dry.  Neurological:     Mental Status: She is alert.     Comments: Right-sided facial droop Paralysis of right upper and right lower extremity   Psychiatric:        Mood and Affect: Mood normal.     ED Results / Procedures / Treatments   Labs (all labs ordered are listed, but only abnormal results are displayed) Labs Reviewed  COMPREHENSIVE METABOLIC PANEL - Abnormal; Notable for the following components:      Result Value   Sodium 134 (*)    Glucose, Bld 140 (*)    Total Protein 6.3 (*)    Albumin 3.4 (*)    AST 75 (*)    GFR, Estimated 59 (*)    All other components within normal limits  HEMOGLOBIN A1C - Abnormal; Notable for the following components:   Hgb A1c MFr Bld 6.6 (*)    All other components within normal limits  I-STAT CHEM 8, ED  - Abnormal; Notable for the following components:   Sodium 134 (*)    Glucose, Bld 137 (*)    Calcium, Ion 1.08 (*)    All other components within normal limits  CBG MONITORING, ED - Abnormal; Notable for the following components:   Glucose-Capillary 138 (*)    All other components within normal limits  MRSA NEXT GEN BY PCR, NASAL  ETHANOL  PROTIME-INR  APTT  CBC  DIFFERENTIAL  RAPID URINE DRUG SCREEN, HOSP PERFORMED  URINALYSIS, ROUTINE W REFLEX MICROSCOPIC  LIPID PANEL  CBC WITH DIFFERENTIAL/PLATELET  BASIC  METABOLIC PANEL    EKG EKG Interpretation Date/Time:  Thursday December 13 2023 21:10:07 EDT Ventricular Rate:  69 PR Interval:  161 QRS Duration:  88 QT Interval:  404 QTC Calculation: 433 R Axis:   -2  Text Interpretation: Sinus rhythm Abnormal R-wave progression, early transition ST elevation, consider inferior injury Confirmed by Estelle June [phone]) on 12/14/2023 12:23:00 AM  Radiology CT HEAD CODE STROKE WO CONTRAST Result Date: 12/13/2023 CLINICAL DATA:  Code stroke. EXAM: CT HEAD WITHOUT CONTRAST TECHNIQUE: Contiguous axial images were obtained from the base of the skull through the vertex without intravenous contrast. RADIATION DOSE REDUCTION: This exam was performed according to the departmental dose-optimization program which includes automated exposure control, adjustment of the mA and/or kV according to patient size and/or use of iterative reconstruction technique. COMPARISON:  Prior study from 02/16/2023 FINDINGS: Brain: Right parietal approach shunt catheter in place with tip in the left lateral ventricle. Ventricular size and morphology is relatively stable from prior without progressive hydrocephalus. Underlying atrophy with chronic microvascular ischemic disease. No acute intracranial hemorrhage. No acute large vessel territory infarct. No mass lesion or midline shift. No extra-axial fluid collection. Vascular: No abnormal hyperdense vessel. Scattered vascular  calcifications noted within the carotid siphons. Skull: Scalp soft tissues demonstrate no acute finding. Calvarium intact. Sinuses/Orbits: Globes orbital soft tissues within normal limits. Paranasal sinuses and mastoid air cells are clear. Other: None. ASPECTS [facility] Stroke Program Early CT Score) - Ganglionic level infarction (caudate, lentiform nuclei, internal capsule, insula, M1-M3 cortex): 7 - Supraganglionic infarction (M4-M6 cortex): 3 Total score (0-10 with 10 being normal): 10 IMPRESSION: 1. No acute intracranial abnormality. 2. ASPECTS is 10. 3. Right parietal approach VP shunt catheter in place. Stable ventricular size and morphology without progressive hydrocephalus. 4. Underlying atrophy with chronic small vessel ischemic disease. These results were communicated to Dr. Wilford Corner at 8:25 pm on 12/13/2023 by text page via the [employer] messaging system. Electronically Signed   By: Rise Mu M.D.   On: 12/13/2023 20:27    Procedures .Critical Care  Performed by: Royanne Foots, DO Authorized by: Royanne Foots, DO   Critical care provider statement:    Critical care time (minutes):  40   Critical care was necessary to treat or prevent imminent or life-threatening deterioration of the following conditions:  CNS failure or compromise   Critical care was time spent personally by me on the following activities:  Development of treatment plan with patient or surrogate, discussions with consultants, evaluation of patient's response to treatment, examination of patient, ordering and review of laboratory studies, ordering and review of radiographic studies, ordering and performing treatments and interventions, pulse oximetry, re-evaluation of patient's condition and review of old charts   I assumed direction of critical care for this patient from another provider in my specialty: no     Care discussed with: admitting provider       Medications Ordered in ED Medications   stroke: early  stages of recovery book (has no administration in time range)  0.9 %  sodium chloride infusion (has no administration in time range)  acetaminophen (TYLENOL) tablet 650 mg (has no administration in time range)    Or  acetaminophen (TYLENOL) 160 MG/5ML solution 650 mg (has no administration in time range)    Or  acetaminophen (TYLENOL) suppository 650 mg (has no administration in time range)  senna-docusate (Senokot-S) tablet 1 tablet (has no administration in time range)  pantoprazole (PROTONIX) injection 40 mg (has no administration in time  range)  acetaminophen (TYLENOL) tablet 650 mg (has no administration in time range)    Or  acetaminophen (TYLENOL) 160 MG/5ML solution 650 mg (has no administration in time range)    Or  acetaminophen (TYLENOL) suppository 650 mg (has no administration in time range)  clevidipine (CLEVIPREX) infusion 0.5 mg/mL (has no administration in time range)  0.9 %  sodium chloride infusion (has no administration in time range)  Chlorhexidine Gluconate Cloth 2 % PADS 6 each (has no administration in time range)  Oral care mouth rinse (has no administration in time range)  Oral care mouth rinse (has no administration in time range)  tenecteplase (TNKASE) injection for Stroke 19 mg (19 mg Intravenous Given 12/13/23 2025)  iohexol (OMNIPAQUE) 350 MG/ML injection 75 mL (75 mLs Intravenous Contrast Given 12/13/23 2032)  iohexol (OMNIPAQUE) 300 MG/ML solution 150 mL (80 mLs Intra-arterial Contrast Given 12/13/23 2308)  nitroGLYCERIN 25 mcg in sodium chloride (PF) 0.9 % 59.71 mL (25 mcg/mL) syringe (25 mcg Intra-arterial Given 12/13/23 2244)    ED Course/ Medical Decision Making/ A&P                                 Medical Decision Making 88 year old female with history as above including recent discharge following STEMI and stent placement at [facility] presenting for acute stroke.  Last known well time 1930.  Paralysis of right upper right lower  extremity with facial droop and aphasia.  Code stroke was activated and patient was examined with neurology team at the doorway.  Initial stroke scale score of 20 per neurology evaluation.  Airway was patent and intact.  She was taken directly to CT.  No intracranial hemorrhage.  Imaging revealedLeft distal M1 occlusion and left M2 branch occlusion.  Tenecteplase was administered after Dr. Wilford Corner (neurology) spoke with family member over the phone and patient was taken to IR suite for revascularization.  She remained hemodynamically stable in the ED and will be admitted to neuro ICU  Amount and/or Complexity of Data Reviewed Labs: ordered. Radiology: ordered.  Risk Decision regarding hospitalization.           Final Clinical Impression(s) / ED Diagnoses Final diagnoses:  Acute ischemic stroke [facility])    Rx / DC Orders ED Discharge Orders     None         Royanne Foots, DO 12/14/23 [phone]

## 2023-12-13 NOTE — ED Triage Notes (Addendum)
 Pt was d/c from hospital today after cardiac cath. EMS reports pt went to bathroom at home on her own & begin having slurred speech, right side facial droop & right sided weakness.  BP 120/70

## 2023-12-13 NOTE — Progress Notes (Signed)
 PHARMACIST CODE STROKE RESPONSE  Notified to mix TNK at 2020 by Dr. Lonna Duval TNK preparation completed at 2025  TNK dose = 19 mg IV over 5 seconds  Issues/delays encountered (if applicable): n/a  Brittany Atkins 12/13/23 8:28 PM

## 2023-12-13 NOTE — Anesthesia Procedure Notes (Signed)
 Procedure Name: Intubation Date/Time: 12/13/2023 9:29 PM  Performed by: Laruth Bouchard., CRNAPre-anesthesia Checklist: Patient identified, Emergency Drugs available, Suction available, Patient being monitored and Timeout performed Patient Re-evaluated:Patient Re-evaluated prior to induction Oxygen Delivery Method: Circle system utilized Preoxygenation: Pre-oxygenation with 100% oxygen Induction Type: IV induction, Rapid sequence and Cricoid Pressure applied Laryngoscope Size: Mac and 3 Grade View: Grade I Tube type: Oral Tube size: 7.5 mm Number of attempts: 1 Airway Equipment and Method: Stylet Placement Confirmation: ETT inserted through vocal cords under direct vision, positive ETCO2 and breath sounds checked- equal and bilateral Secured at: 23 cm Tube secured with: Tape Dental Injury: Teeth and Oropharynx as per pre-operative assessment

## 2023-12-13 NOTE — Anesthesia Preprocedure Evaluation (Signed)
 Anesthesia Evaluation  Patient identified by MRN, date of birth, ID band Patient awake    Reviewed: Allergy & Precautions, NPO status , Patient's Chart, lab work & pertinent test resultsPreop documentation limited or incomplete due to emergent nature of procedure.  Airway Mallampati: II  TM Distance: >3 FB Neck ROM: Full    Dental   Pulmonary asthma , sleep apnea    breath sounds clear to auscultation       Cardiovascular hypertension, + Peripheral Vascular Disease   Rhythm:Regular Rate:Normal     Neuro/Psych  Neuromuscular disease CVA    GI/Hepatic Neg liver ROS,GERD  ,,  Endo/Other  diabetes    Renal/GU Renal disease     Musculoskeletal  (+) Arthritis ,    Abdominal   Peds  Hematology negative hematology ROS (+)   Anesthesia Other Findings   Reproductive/Obstetrics                             Anesthesia Physical Anesthesia Plan  ASA: 4 and emergent  Anesthesia Plan: General   Post-op Pain Management: Minimal or no pain anticipated   Induction: Intravenous  PONV Risk Score and Plan: 3 and Dexamethasone, Ondansetron and Treatment may vary due to age or medical condition  Airway Management Planned: Oral ETT  Additional Equipment: Arterial line  Intra-op Plan:   Post-operative Plan: Possible Post-op intubation/ventilation  Informed Consent: I have reviewed the patients History and Physical, chart, labs and discussed the procedure including the risks, benefits and alternatives for the proposed anesthesia with the patient or authorized representative who has indicated his/her understanding and acceptance.     Dental advisory given  Plan Discussed with: CRNA  Anesthesia Plan Comments:        Anesthesia Quick Evaluation

## 2023-12-13 NOTE — ED Notes (Signed)
 TNK given by Stroke RN Theodoro Grist

## 2023-12-13 NOTE — Anesthesia Procedure Notes (Signed)
 Arterial Line Insertion Start/End3/20/2025 9:44 PM Performed by: Laruth Bouchard., CRNA, CRNA  Preanesthetic checklist: patient identified, IV checked, site marked, risks and benefits discussed, surgical consent, monitors and equipment checked, pre-op evaluation, timeout performed and anesthesia consent Patient sedated Left, radial was placed Catheter size: 18 G Hand hygiene performed  and maximum sterile barriers used   Attempts: 1 Procedure performed without using ultrasound guided technique. Following insertion, dressing applied and Biopatch. Post procedure assessment: normal  Patient tolerated the procedure well with no immediate complications.

## 2023-12-13 NOTE — Code Documentation (Signed)
 Stroke Response Nurse Documentation Code Documentation  Brittany Atkins is a 88 y.o. female arriving to [facility]  via [place] [facility] on 3/20 with past medical hx of HTN, HLD, OSA, CVA, IBS. On aspirin 325 mg daily and clopidogrel 75 mg daily. Code stroke was activated by EMS.   Patient from home where she was LKW at 1930 and now complaining of right sided weakness and aphasia.   Stroke team at the bedside on patient arrival. Labs drawn and patient cleared for CT by Dr. Elayne Snare. Patient to CT with team. NIHSS 20, see documentation for details and code stroke times. Patient with disoriented, not following commands, right hemianopia, right facial droop, right arm weakness, right leg weakness, right decreased sensation, Global aphasia , and dysarthria  on exam. The following imaging was completed:  CT Head and CTA. Patient is a candidate for IV Thrombolytic due to fixed neurological deficit. Patient is a candidate for IR due to LVO.   Care Plan: TNKase and mechanical thrombectomy.     Bedside handoff with IR RN Ashura.    Rose Fillers  Rapid Response RN

## 2023-12-14 ENCOUNTER — Encounter (HOSPITAL_COMMUNITY): Payer: Self-pay | Admitting: Interventional Radiology

## 2023-12-14 ENCOUNTER — Inpatient Hospital Stay (HOSPITAL_COMMUNITY): Payer: Medicare (Managed Care)

## 2023-12-14 DIAGNOSIS — I6389 Other cerebral infarction: Secondary | ICD-10-CM

## 2023-12-14 DIAGNOSIS — I63512 Cerebral infarction due to unspecified occlusion or stenosis of left middle cerebral artery: Secondary | ICD-10-CM | POA: Diagnosis not present

## 2023-12-14 LAB — URINALYSIS, COMPLETE (UACMP) WITH MICROSCOPIC
Bilirubin Urine: NEGATIVE
Glucose, UA: NEGATIVE mg/dL
Ketones, ur: NEGATIVE mg/dL
Nitrite: NEGATIVE
Protein, ur: 100 mg/dL — AB
Specific Gravity, Urine: >1.03 — ABNORMAL HIGH (ref 1.005–1.030)
pH: 6 (ref 5.0–8.0)

## 2023-12-14 LAB — LIPID PANEL
Cholesterol: 173 mg/dL (ref 0–200)
HDL: 34 mg/dL — ABNORMAL LOW (ref >40)
LDL Cholesterol: 112 mg/dL — ABNORMAL HIGH (ref 0–99)
Total CHOL/HDL Ratio: 5.1 ratio
Triglycerides: 134 mg/dL (ref <150)
VLDL: 27 mg/dL (ref 0–40)

## 2023-12-14 LAB — CBC WITH DIFFERENTIAL/PLATELET
Abs Immature Granulocytes: 0.02 10*3/uL (ref 0.00–0.07)
Basophils Absolute: 0 10*3/uL (ref 0.0–0.1)
Basophils Relative: 0 %
Eosinophils Absolute: 0 10*3/uL (ref 0.0–0.5)
Eosinophils Relative: 0 %
HCT: 29.4 % — ABNORMAL LOW (ref 36.0–46.0)
Hemoglobin: 9.6 g/dL — ABNORMAL LOW (ref 12.0–15.0)
Immature Granulocytes: 0 %
Lymphocytes Relative: 9 %
Lymphs Abs: 0.7 10*3/uL (ref 0.7–4.0)
MCH: 29.8 pg (ref 26.0–34.0)
MCHC: 32.7 g/dL (ref 30.0–36.0)
MCV: 91.3 fL (ref 80.0–100.0)
Monocytes Absolute: 0.6 10*3/uL (ref 0.1–1.0)
Monocytes Relative: 7 %
Neutro Abs: 6.8 10*3/uL (ref 1.7–7.7)
Neutrophils Relative %: 84 %
Platelets: 134 10*3/uL — ABNORMAL LOW (ref 150–400)
RBC: 3.22 MIL/uL — ABNORMAL LOW (ref 3.87–5.11)
RDW: 13.4 % (ref 11.5–15.5)
WBC: 8.1 10*3/uL (ref 4.0–10.5)
nRBC: 0 % (ref 0.0–0.2)

## 2023-12-14 LAB — ECHOCARDIOGRAM COMPLETE
AR max vel: 1.75 cm2
AV Area VTI: 1.77 cm2
AV Area mean vel: 1.68 cm2
AV Mean grad: 12 mmHg
AV Peak grad: 22.8 mmHg
Ao pk vel: 2.39 m/s
Area-P 1/2: 3.6 cm2
Height: 62 in
MV M vel: 5.54 m/s
MV Peak grad: 122.8 mmHg
S' Lateral: 2.4 cm
Weight: 2779.56 [oz_av]

## 2023-12-14 LAB — GLUCOSE, CAPILLARY: Glucose-Capillary: 111 mg/dL — ABNORMAL HIGH (ref 70–99)

## 2023-12-14 LAB — BASIC METABOLIC PANEL
Anion gap: 8 (ref 5–15)
BUN: 17 mg/dL (ref 8–23)
CO2: 20 mmol/L — ABNORMAL LOW (ref 22–32)
Calcium: 7.8 mg/dL — ABNORMAL LOW (ref 8.9–10.3)
Chloride: 106 mmol/L (ref 98–111)
Creatinine, Ser: 0.67 mg/dL (ref 0.44–1.00)
GFR, Estimated: >60 mL/min (ref >60)
Glucose, Bld: 145 mg/dL — ABNORMAL HIGH (ref 70–99)
Potassium: 4 mmol/L (ref 3.5–5.1)
Sodium: 134 mmol/L — ABNORMAL LOW (ref 135–145)

## 2023-12-14 LAB — MRSA NEXT GEN BY PCR, NASAL: MRSA by PCR Next Gen: NOT DETECTED

## 2023-12-14 LAB — HEMOGLOBIN AND HEMATOCRIT, BLOOD
HCT: 29.4 % — ABNORMAL LOW (ref 36.0–46.0)
Hemoglobin: 9.7 g/dL — ABNORMAL LOW (ref 12.0–15.0)

## 2023-12-14 MED ORDER — ORAL CARE MOUTH RINSE: 15.0000 mL | OROMUCOSAL | Status: DC | PRN

## 2023-12-14 MED ORDER — PANTOPRAZOLE SODIUM 40 MG PO TBEC
40.0000 mg | DELAYED_RELEASE_TABLET | Freq: Every day | ORAL | Status: DC
  Administered 2023-12-14 – 2023-12-17 (×4): 40 mg via ORAL
  Filled 2023-12-14 (×4): qty 1

## 2023-12-14 MED ORDER — CHLORHEXIDINE GLUCONATE CLOTH 2 % EX PADS
6.0000 | MEDICATED_PAD | Freq: Every day | CUTANEOUS | Status: DC
  Administered 2023-12-14 – 2023-12-17 (×3): 6 via TOPICAL

## 2023-12-14 MED ORDER — ATORVASTATIN CALCIUM 40 MG PO TABS
40.0000 mg | ORAL_TABLET | Freq: Every day | ORAL | Status: DC
  Administered 2023-12-14 – 2023-12-18 (×5): 40 mg via ORAL
  Filled 2023-12-14 (×5): qty 1

## 2023-12-14 MED ORDER — ASPIRIN 81 MG PO TBEC
81.0000 mg | DELAYED_RELEASE_TABLET | Freq: Every day | ORAL | Status: DC
  Administered 2023-12-14 – 2023-12-18 (×5): 81 mg via ORAL
  Filled 2023-12-14 (×5): qty 1

## 2023-12-14 MED ORDER — TICAGRELOR 90 MG PO TABS
90.0000 mg | ORAL_TABLET | Freq: Two times a day (BID) | ORAL | Status: DC
  Administered 2023-12-14 – 2023-12-18 (×8): 90 mg via ORAL
  Filled 2023-12-14 (×8): qty 1

## 2023-12-14 MED ORDER — ORAL CARE MOUTH RINSE
15.0000 mL | OROMUCOSAL | Status: DC
  Administered 2023-12-14 – 2023-12-18 (×15): 15 mL via OROMUCOSAL

## 2023-12-14 MED ORDER — ENOXAPARIN SODIUM 40 MG/0.4ML IJ SOSY
40.0000 mg | PREFILLED_SYRINGE | INTRAMUSCULAR | Status: DC
  Administered 2023-12-14 – 2023-12-17 (×4): 40 mg via SUBCUTANEOUS
  Filled 2023-12-14 (×4): qty 0.4

## 2023-12-14 NOTE — Transfer of Care (Signed)
 Immediate Anesthesia Transfer of Care Note  Patient: Brittany Atkins  Procedure(s) Performed: RADIOLOGY WITH ANESTHESIA  Patient Location: PACU  Anesthesia Type:General  Level of Consciousness: awake, alert , oriented, and drowsy  Airway & Oxygen Therapy: Patient Spontanous Breathing and Patient connected to nasal cannula oxygen  Post-op Assessment: Report given to RN and Post -op Vital signs reviewed and stable  Post vital signs: Reviewed and stable  Last Vitals:  Vitals Value Taken Time  BP 103/52 12/14/23 0130  Temp    Pulse 57 12/14/23 0143  Resp 12 12/14/23 0143  SpO2 97 % 12/14/23 0143  Vitals shown include unfiled device data.  Last Pain:  Vitals:   12/14/23 0030  TempSrc: Axillary  PainSc: 0-No pain         Complications: No notable events documented.

## 2023-12-14 NOTE — Progress Notes (Addendum)
 STROKE TEAM PROGRESS NOTE    SIGNIFICANT HOSPITAL EVENTS 3/20 received IV TNK for acute onset of right-sided weakness and aphasia CTA with left M1 occlusion at the bifurcation and a concomitant left M2 branch occlusion.  Went to IR  INTERIM HISTORY/SUBJECTIVE No family at the bedside.  Patient is awake alert laying in bed in no apparent distress. She still is having aphasia with some word finding difficulties.  She is able to state her name and for age states "88 "  Her neurological exam she is awake alert oriented to self, expressive aphasia with mild dysarthria, eyes are midline and she tracks, right facial droop right arm weakness with drift, right leg with mild drift, left arm and leg 5/5, ataxia on right,  MRI MRA scheduled for this evening around 1800 If MRI brain is negative for hemorrhage will start aspirin and Brilinta  Can DC A-line today  OBJECTIVE  CBC    Component Value Date/Time   WBC 8.1 12/14/2023 0301   RBC 3.22 (L) 12/14/2023 0301   HGB 9.7 (L) 12/14/2023 0751   HCT 29.4 (L) 12/14/2023 0751   PLT 134 (L) 12/14/2023 0301   MCV 91.3 12/14/2023 0301   MCH 29.8 12/14/2023 0301   MCHC 32.7 12/14/2023 0301   RDW 13.4 12/14/2023 0301   LYMPHSABS 0.7 12/14/2023 0301   MONOABS 0.6 12/14/2023 0301   EOSABS 0.0 12/14/2023 0301   BASOSABS 0.0 12/14/2023 0301    BMET    Component Value Date/Time   NA 134 (L) 12/14/2023 0434   K 4.0 12/14/2023 0434   CL 106 12/14/2023 0434   CO2 20 (L) 12/14/2023 0434   GLUCOSE 145 (H) 12/14/2023 0434   BUN 17 12/14/2023 0434   CREATININE 0.67 12/14/2023 0434   CALCIUM 7.8 (L) 12/14/2023 0434   GFRNONAA >60 12/14/2023 0434    IMAGING past 24 hours ECHOCARDIOGRAM COMPLETE Result Date: 12/14/2023    ECHOCARDIOGRAM REPORT   Patient Name:   Brittany Atkins [facility] Date of Exam: 12/14/2023 Medical Rec #:  [id number]                Height:       62.0 in Accession #:    [id number]               Weight:       173.7 lb Date of Birth:   [birth year]                 BSA:          1.801 m Patient Age:    88 years                 BP:           117/49 mmHg Patient Gender: F                        HR:           61 bpm. Exam Location:  Inpatient Procedure: 2D Echo (Both Spectral and Color Flow Doppler were utilized during            procedure). Indications:    stroke  History:        Patient has prior history of Echocardiogram examinations, most                 recent 07/06/2020. Risk Factors:Hypertension, Dyslipidemia,                 Diabetes and  Sleep Apnea.  Sonographer:    Delcie Roch RDCS Referring Phys: [id number] ASHISH ARORA IMPRESSIONS  1. Left ventricular ejection fraction, by estimation, is 70 to 75%. Left ventricular ejection fraction by PLAX is 74 %. The left ventricle has hyperdynamic function. The left ventricle has no regional wall motion abnormalities. There is mild left ventricular hypertrophy. Left ventricular diastolic parameters are consistent with Grade I diastolic dysfunction (impaired relaxation).  2. Right ventricular systolic function is normal. The right ventricular size is normal. There is mildly elevated pulmonary artery systolic pressure. The estimated right ventricular systolic pressure is 38.5 mmHg.  3. The mitral valve is abnormal. Trivial mitral valve regurgitation.  4. The aortic valve is tricuspid. Aortic valve regurgitation is not visualized. Mild aortic valve stenosis. Aortic valve area, by VTI measures 1.77 cm. Aortic valve mean gradient measures 12.0 mmHg. Aortic valve Vmax measures 2.39 m/s. Peak gradient 22.8 mmHg, DI 0.56.  5. The inferior vena cava is dilated in size with >50% respiratory variability, suggesting right atrial pressure of 8 mmHg. Comparison(s): Changes from prior study are noted. 07/06/2020: LVEF 60-65%, aortic sclerosis. FINDINGS  Left Ventricle: Left ventricular ejection fraction, by estimation, is 70 to 75%. Left ventricular ejection fraction by PLAX is 74 %. The left ventricle has  hyperdynamic function. The left ventricle has no regional wall motion abnormalities. The left ventricular internal cavity size was normal in size. There is mild left ventricular hypertrophy. Left ventricular diastolic parameters are consistent with Grade I diastolic dysfunction (impaired relaxation). Indeterminate filling pressures. Right Ventricle: The right ventricular size is normal. No increase in right ventricular wall thickness. Right ventricular systolic function is normal. There is mildly elevated pulmonary artery systolic pressure. The tricuspid regurgitant velocity is 2.76  m/s, and with an assumed right atrial pressure of 8 mmHg, the estimated right ventricular systolic pressure is 38.5 mmHg. Left Atrium: Left atrial size was normal in size. Right Atrium: Right atrial size was normal in size. Pericardium: There is no evidence of pericardial effusion. Mitral Valve: The mitral valve is abnormal. There is mild calcification of the posterior mitral valve leaflet(s). Trivial mitral valve regurgitation. Tricuspid Valve: The tricuspid valve is grossly normal. Tricuspid valve regurgitation is trivial. Aortic Valve: The aortic valve is tricuspid. Aortic valve regurgitation is not visualized. Mild aortic stenosis is present. Aortic valve mean gradient measures 12.0 mmHg. Aortic valve peak gradient measures 22.8 mmHg. Aortic valve area, by VTI measures 1.77 cm. Pulmonic Valve: The pulmonic valve was grossly normal. Pulmonic valve regurgitation is trivial. Aorta: The aortic root and ascending aorta are structurally normal, with no evidence of dilitation. Venous: The inferior vena cava is dilated in size with greater than 50% respiratory variability, suggesting right atrial pressure of 8 mmHg. IAS/Shunts: No atrial level shunt detected by color flow Doppler.  LEFT VENTRICLE PLAX 2D LV EF:         Left            Diastology                ventricular     LV e' medial:    6.20 cm/s                ejection        LV  E/e' medial:  19.2                fraction by     LV e' lateral:   6.96 cm/s  PLAX is 74      LV E/e' lateral: 17.1                %. LVIDd:         4.20 cm LVIDs:         2.40 cm LV PW:         1.20 cm LV IVS:        1.20 cm LVOT diam:     2.00 cm LV SV:         94 LV SV Index:   52 LVOT Area:     3.14 cm  RIGHT VENTRICLE             IVC RV Basal diam:  2.30 cm     IVC diam: 2.40 cm RV S prime:     13.20 cm/s TAPSE (M-mode): 3.0 cm LEFT ATRIUM             Index        RIGHT ATRIUM          Index LA diam:        3.50 cm 1.94 cm/m   RA Area:     9.92 cm LA Vol (A2C):   48.2 ml 26.77 ml/m  RA Volume:   19.50 ml 10.83 ml/m LA Vol (A4C):   36.0 ml 19.99 ml/m LA Biplane Vol: 41.8 ml 23.21 ml/m  AORTIC VALVE AV Area (Vmax):    1.75 cm AV Area (Vmean):   1.68 cm AV Area (VTI):     1.77 cm AV Vmax:           239.00 cm/s AV Vmean:          163.000 cm/s AV VTI:            0.529 m AV Peak Grad:      22.8 mmHg AV Mean Grad:      12.0 mmHg LVOT Vmax:         133.00 cm/s LVOT Vmean:        87.250 cm/s LVOT VTI:          0.298 m LVOT/AV VTI ratio: 0.56  AORTA Ao Root diam: 2.80 cm Ao Asc diam:  2.70 cm MITRAL VALVE                TRICUSPID VALVE MV Area (PHT): 3.60 cm     TR Peak grad:   30.5 mmHg MV Decel Time: 211 msec     TR Vmax:        276.00 cm/s MR Peak grad: 122.8 mmHg MR Mean grad: 74.0 mmHg     SHUNTS MR Vmax:      554.00 cm/s   Systemic VTI:  0.30 m MR Vmean:     400.0 cm/s    Systemic Diam: 2.00 cm MV E velocity: 119.00 cm/s MV A velocity: 114.00 cm/s MV E/A ratio:  1.04 Zoila Shutter Brittany Electronically signed by Zoila Shutter Brittany Signature Date/Time: 12/14/2023/11:56:50 AM    Final    CT ANGIO HEAD NECK W WO CM (CODE STROKE) Result Date: 12/13/2023 CLINICAL DATA:  Initial evaluation for acute stroke. EXAM: CT ANGIOGRAPHY HEAD AND NECK WITH AND WITHOUT CONTRAST TECHNIQUE: Multidetector CT imaging of the head and neck was performed using the standard protocol during bolus administration of  intravenous contrast. Multiplanar CT image reconstructions and MIPs were obtained to evaluate the vascular anatomy. Carotid stenosis measurements (when applicable) are obtained utilizing NASCET criteria, using the distal internal carotid diameter as  the denominator. RADIATION DOSE REDUCTION: This exam was performed according to the departmental dose-optimization program which includes automated exposure control, adjustment of the mA and/or kV according to patient size and/or use of iterative reconstruction technique. CONTRAST:  75mL OMNIPAQUE IOHEXOL 350 MG/ML SOLN COMPARISON:  CT from earlier the same day. FINDINGS: CTA NECK FINDINGS Aortic arch: Visualized aortic arch within normal limits for caliber with standard branch pattern. Aortic atherosclerosis. No significant stenosis about the origin the great vessels. Right carotid system: Right common and internal carotid arteries are patent without dissection. Mild atheromatous change about the right carotid bulb without hemodynamically significant stenosis. Left carotid system: Left common and internal carotid arteries are patent without dissection. No mild atheromatous change about the left carotid bulb without hemodynamically significant stenosis. Small focus of intimal irregularity at the proximal cervical left ICA suspicious for thrombus, suspected to reflect changes related to a recently ruptured plaque (series 6, image 180). Vertebral arteries: Both vertebral arteries arise from subclavian arteries. Left vertebral artery dominant. Vertebral arteries are patent without stenosis or dissection. Skeleton: No worrisome osseous lesions. Advanced spondylosis at C6-7. Other neck: No other acute finding. Upper chest: No other acute finding. Review of the MIP images confirms the above findings CTA HEAD FINDINGS Anterior circulation: Atheromatous change about the carotid siphons without hemodynamically significant stenosis. A1 segments patent bilaterally. Normal anterior  communicating artery complex. Anterior cerebral arteries patent without significant stenosis. Right M1 segment and distal right MCA branches are patent well perfused. There is acute occlusion of the distal left M1 segment at the level of the left MCA bifurcation (series 6, image 295). Additional downstream proximal left M2 occlusion, inferior division (series 6, image 302). Posterior circulation: Both V4 segments patent without significant stenosis. Both PICA patent. Focal moderate stenosis involving the proximal basilar artery noted (series 8, image 96). Basilar otherwise patent distally. Superior cerebral arteries patent bilaterally. Right PCA supplied via the basilar. Fetal type origin of the left PCA. PCAs are mildly irregular but patent to their distal aspects without significant stenosis. Venous sinuses: Patent allowing for timing the contrast bolus. Anatomic variants: As above.  No aneurysm. Review of the MIP images confirms the above findings IMPRESSION: 1. Positive CTA for emergent large vessel occlusion, with occlusion of the distal left M1 segment at the level of the left MCA bifurcation. Additional downstream proximal left M2 occlusion, inferior division. 2. Small focus of intimal irregularity at the proximal cervical left ICA, suspicious for thrombus, suspected to reflect changes related to a recently ruptured plaque. This is presumably the embolic source. 3. Focal moderate stenosis involving the proximal basilar artery. 4. Additional mild atheromatous change about the carotid bifurcations and carotid siphons without hemodynamically significant stenosis. 5.  Aortic Atherosclerosis (ICD10-I70.0). Critical Value/emergent results were discussed by telephone at the time of interpretation on 12/13/2023 at 8:41 pm to provider [facility] , who verbally acknowledged these results. Electronically Signed   By: Rise Mu M.D.   On: 12/13/2023 21:32   CT HEAD CODE STROKE WO CONTRAST Result Date:  12/13/2023 CLINICAL DATA:  Code stroke. EXAM: CT HEAD WITHOUT CONTRAST TECHNIQUE: Contiguous axial images were obtained from the base of the skull through the vertex without intravenous contrast. RADIATION DOSE REDUCTION: This exam was performed according to the departmental dose-optimization program which includes automated exposure control, adjustment of the mA and/or kV according to patient size and/or use of iterative reconstruction technique. COMPARISON:  Prior study from 02/16/2023 FINDINGS: Brain: Right parietal approach shunt catheter in place with tip  in the left lateral ventricle. Ventricular size and morphology is relatively stable from prior without progressive hydrocephalus. Underlying atrophy with chronic microvascular ischemic disease. No acute intracranial hemorrhage. No acute large vessel territory infarct. No mass lesion or midline shift. No extra-axial fluid collection. Vascular: No abnormal hyperdense vessel. Scattered vascular calcifications noted within the carotid siphons. Skull: Scalp soft tissues demonstrate no acute finding. Calvarium intact. Sinuses/Orbits: Globes orbital soft tissues within normal limits. Paranasal sinuses and mastoid air cells are clear. Other: None. ASPECTS [facility] Stroke Program Early CT Score) - Ganglionic level infarction (caudate, lentiform nuclei, internal capsule, insula, M1-M3 cortex): 7 - Supraganglionic infarction (M4-M6 cortex): 3 Total score (0-10 with 10 being normal): 10 IMPRESSION: 1. No acute intracranial abnormality. 2. ASPECTS is 10. 3. Right parietal approach VP shunt catheter in place. Stable ventricular size and morphology without progressive hydrocephalus. 4. Underlying atrophy with chronic small vessel ischemic disease. These results were communicated to Dr. Wilford Corner at 8:25 pm on 12/13/2023 by text page via the [facility] messaging system. Electronically Signed   By: Rise Mu M.D.   On: 12/13/2023 20:27    Vitals:   12/14/23 1000 12/14/23  1100 12/14/23 1200 12/14/23 1300  BP: (!) 109/51 (!) 116/52 (!) 112/47 (!) 111/54  Pulse: 60 (!) 59 (!) 55 62  Resp: 13 14 13 17   Temp:   98.3 F (36.8 C)   TempSrc:   Axillary   SpO2: 96% 97% 97% 96%  Weight:      Height:         PHYSICAL EXAM General: Critically ill elderly female Psych:  Mood and affect appropriate for situation CV: Regular rate and rhythm on monitor Respiratory:  Regular, unlabored respirations on cannula GI: Abdomen soft and nontender   NEURO:  Mental Status: She is awake alert and oriented self, expressive aphasia with mild dysarthria, she was able to state her name and her age "50 ", still having word finding difficulties  Cranial Nerves:  II: PERRL. Visual fields full.  III, IV, VI: EOMI. Eyelids elevate symmetrically.  V: Sensation is intact to light touch and symmetrical to face.  VII: Right facial VIII: hearing intact to voice. IX, X: Palate elevates symmetrically. Phonation is normal.  NF:AOZHYQMV shrug 5/5. XII: tongue is midline without fasciculations. Motor: right arm weakness with drift, right leg with mild drift, left arm and leg 5/5, Tone: is normal and bulk is normal Sensation- Intact to light touch bilaterally. Extinction absent to light touch to DSS.   Coordination: FTN intact bilaterally, HKS: no ataxia in BLE.No drift.  Gait- deferred  Most Recent NIH 9   ASSESSMENT/Atkins  Brittany Atkins is a 88 y.o. female with history of diabetes, chronic pain, fibromyalgia, prior history of TIA without residual deficits, headache, hypertension, peripheral arterial disease, hyperlipidemia, thyroid disease, chronic pain sleep apnea, history of a VP shunt, recent admission at [facility] hospital for STEMI with cardiac catheterization/PCI-presented to the emergency department for evaluation of right-sided weakness and aphasia as a code stroke.  NIH 20  Stroke:  left MCA scattered infarcts and right MCA punctate infarct with  left M1/M2 occlusion s/p IV TNK and IR with TICI 3, etiology: Likely cardioembolic source with recent STEMI vs. Occult afib Code Stroke CT head No acute abnormality. Small vessel disease. Atrophy. ASPECTS 10.    CTA head & neck occlusion of the distal left M1 segment at the level of the left MCA bifurcation. Additional downstream proximal left M2 occlusion Status post IR with  left M1/M2 occlusion and TICI3 Post IR CT no hemorrhage MRI left MCA scattered infarct with right MCA punctate infarct MRA left MCA now patent 2D Echo EF 70 to 75%.  Mild LVH with grade 1 diastolic dysfunction LE venous Doppler pending Consider loop recorder prior to discharge LDL 112 HgbA1c 6.6 VTE prophylaxis -Lovenox No antithrombotic (aspirin and Brilinta post discharge on 3/20 however patient was not taking) prior to admission, now on ASA and brilinta given chronically stent Therapy recommendations:  Pending Disposition: Pending  CAD STEMI status post cardiac stent recently discharged from [facility] after getting a cardiac catheterization for STEMI Discharged home 3/20 and on DAPT.  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 Pt was seen and examined.   No family at bedside.  Patient  lying bed, awake alert, eyes open, orientated to self, stated age 65 instead of 39, however not orientated to time. Expressive aphasia but able to say some simple sentence such as "I do not know".  Able to follow midline commands, limited peripheral commands, difficulty with naming and repeating. No gaze palsy, tracking bilaterally, blinking to visual threat bilaterally, PERRL.  Mild right facial droop. Tongue midline.  Right upper extremity pronator drift, right lower extremity 4-/5.  Left upper extremity 5/5, left lower extremity 4/5. Sensation and coordination not corporative, gait not tested.   Patient had left MCA scattered infarcts but also had right MCA punctate infarct, embolic pattern, concerning for occult A-fib versus recent STEMI.  Recommend loop recorder if workup unrevealing.  Put back on aspirin and Brilinta for cardiac stent.  Continue statin.  PT OT pending.  Patient does have dysuria, will check UA to rule out UTI.  For detailed assessment and Atkins, please refer to above/below as I have made changes wherever appropriate.   Brittany Atkins, Brittany Atkins Stroke Neurology 12/14/2023 7:08 PM  This patient is critically ill due to left MCA stroke status post TNK and thrombectomy, recent STEMI status post stent and at significant risk of neurological worsening, death form recurrent stroke, hemorrhagic conversion, heart failure, heart attack. This patient's care requires constant monitoring of vital signs, hemodynamics, respiratory and cardiac monitoring, review of multiple databases, neurological assessment, discussion with family, other specialists and medical decision making of high complexity. I spent 40 minutes of neurocritical care time in the care of this patient.    To contact Stroke Continuity provider, please refer to [email or web address]. After hours, contact General Neurology

## 2023-12-14 NOTE — TOC Initial Note (Signed)
 Transition of Care [facility]) - Initial/Assessment Note    Patient Details  Name: Brittany Atkins MRN: [id number] Date of Birth: 09-18-1936  [facility] of [facility]) CM/SW Contact:    Lamonte Sakai, Student-Social Work Phone Number: 12/14/2023, 9:17 AM  Clinical Narrative:                 Pt admitted from home with spouse-undergoing stroke workup. No current TOC needs, please consult as needs arise following therapy eval.        Patient Goals and CMS Choice            Expected Discharge Plan and Services       Living arrangements for the past 2 months: Single Family Home                                      Prior Living Arrangements/Services Living arrangements for the past 2 months: Single Family Home Lives with:: Spouse                   Activities of Daily Living      Permission Sought/Granted                  Emotional Assessment              Admission diagnosis:  Acute ischemic left MCA stroke (HCC) [I63.512] Middle cerebral artery embolism, left [I66.02] Patient Active Problem List   Diagnosis Date Noted   Acute ischemic left MCA stroke (HCC) 12/13/2023   Middle cerebral artery embolism, left 12/13/2023   Acute focal neurological deficit 07/05/2020   Acute appendicitis 02/06/2018   Memory loss 04/21/2016   Lumbar radiculopathy 02/29/2016   Diastolic dysfunction 02/21/2016   History of peptic ulcer disease 02/14/2016   Chronic GERD 12/08/2015   Irritable bowel syndrome with constipation 12/08/2015   Type 2 diabetes mellitus with obesity (HCC) 12/08/2015   Benign essential HTN 08/18/2015   Fibromyalgia 08/18/2015   Obstructive sleep apnea 08/18/2015   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 [place], [place] - [address] [place]  [address] Phone: [phone] Fax: [phone]     Social Drivers of Health (SDOH) Social History: SDOH Screenings   Food Insecurity: No Food Insecurity (12/14/2023)  Housing: Low Risk  (12/14/2023)  Transportation Needs: No Transportation Needs (12/14/2023)  Utilities: Not At Risk (12/14/2023)  Social Connections: Patient Unable To Answer (12/14/2023)  Tobacco Use: Low Risk  (12/11/2023)   Received from [facility]   SDOH Interventions:     Readmission Risk Interventions     No data to display

## 2023-12-14 NOTE — Anesthesia Postprocedure Evaluation (Signed)
 Anesthesia Post Note  Patient: Brittany Atkins  Procedure(s) Performed: RADIOLOGY WITH ANESTHESIA     Patient location during evaluation: PACU Anesthesia Type: General Level of consciousness: awake and alert Pain management: pain level controlled Vital Signs Assessment: post-procedure vital signs reviewed and stable Respiratory status: spontaneous breathing, nonlabored ventilation, respiratory function stable and patient connected to nasal cannula oxygen Cardiovascular status: blood pressure returned to baseline and stable Postop Assessment: no apparent nausea or vomiting Anesthetic complications: no   No notable events documented.  Last Vitals:  Vitals:   12/14/23 0630 12/14/23 0700  BP: (!) 100/58   Pulse: (!) 56 (!) 57  Resp: 14 12  Temp:    SpO2: 99% 99%    Last Pain:  Vitals:   12/14/23 0400  TempSrc: Oral  PainSc:                  Kennieth Rad

## 2023-12-14 NOTE — Progress Notes (Addendum)
 PT Cancellation Note  Patient Details Name: Brittany Atkins MRN: [id number] DOB: [birth year]   Cancelled Treatment:    Reason Eval/Treat Not Completed: Active bedrest order remains this morning and per chart, pt with oozing from groin site around 4:30 this morning. Will continue to follow and evaluate as appropriate.   Addendum 12:44 PM: Per RN, bedrest to remain until 8PM tonight. PT will check back tomorrow for evaluation.   Vickki Muff, PT, DPT   Acute Rehabilitation Department Office [phone] Secure Chat Communication Preferred   Ronnie Derby 12/14/2023, 8:00 AM

## 2023-12-14 NOTE — Progress Notes (Signed)
  Echocardiogram 2D Echocardiogram has been performed.  Delcie Roch 12/14/2023, 9:07 AM

## 2023-12-14 NOTE — Evaluation (Signed)
 Speech Language Pathology Evaluation Patient Details Name: Brittany Atkins MRN: [id number] DOB: 1936/04/23 Today's Date: 12/14/2023 Time: 7425-9563 SLP Time Calculation (min) (ACUTE ONLY): 25 min  Problem List:  Patient Active Problem List   Diagnosis Date Noted   Acute ischemic left MCA stroke (HCC) 12/13/2023   Middle cerebral artery embolism, left 12/13/2023   Acute focal neurological deficit 07/05/2020   Acute appendicitis 02/06/2018   Memory loss 04/21/2016   Lumbar radiculopathy 02/29/2016   Diastolic dysfunction 02/21/2016   History of peptic ulcer disease 02/14/2016   Chronic GERD 12/08/2015   Irritable bowel syndrome with constipation 12/08/2015   Type 2 diabetes mellitus with obesity (HCC) 12/08/2015   Benign essential HTN 08/18/2015   Fibromyalgia 08/18/2015   Obstructive sleep apnea 08/18/2015   Communicating hydrocephalus (HCC) 08/18/2014   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 08/18/2014   Past Medical History:  Past Medical History:  Diagnosis Date   Allergy    seasonal   Anxiety    Asthma with allergic rhinitis    Cataract    surgery   Cervical radiculopathy    Chronic kidney disease    ladder is not filling up/ usually incontinent   Chronic pain    Diabetes mellitus without complication (HCC)    Fibromyalgia    GERD (gastroesophageal reflux disease)    H/O TIA (transient ischemic attack) and stroke    Headache    HLD (hyperlipidemia)    Hypertension    IBS (irritable bowel syndrome)    Insomnia    OSA (obstructive sleep apnea)    Osteoarthritis    PAD (peripheral artery disease) (HCC)    Positive PPD    Sleep apnea    doesn't use her CPAP   Stroke (HCC)    TIA's; MAy 2017   Thyroid disease    Tuberculosis    positive tb tests all of her life/ 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   Past Surgical History:  Past Surgical History:  Procedure Laterality Date   brain shunt     hydrocephalis   CATARACT EXTRACTION, BILATERAL  2014   HIP  SURGERY  2009   hip muscle   HYDROCELE EXCISION / REPAIR  07/2010   LAPAROSCOPIC ABDOMINAL EXPLORATION N/A 02/07/2018   Procedure: LAPAROSCOPIC ABDOMINAL EXPLORATION;  Surgeon: Ovidio Kin, MD;  Location: [facility];  Service: General;  Laterality: N/A;  possible laparoscopic appendectomy, need pathology report   LUMBAR LAMINECTOMY  2009   OVARIAN CYST REMOVAL     with  fallopian tube   RADIOLOGY WITH ANESTHESIA N/A 12/13/2023   Procedure: RADIOLOGY WITH ANESTHESIA;  Surgeon: Julieanne Cotton, MD;  Location: [facility] OR;  Service: Radiology;  Laterality: N/A;   TOTAL ABDOMINAL HYSTERECTOMY     HPI:  Brittany Atkins is a 88 y.o. female presented to the emergency department for evaluation of right-sided weakness and aphasia as a code stroke.  Patient was recently discharged from [facility] hospital after getting a cardiac catheterization for STEMI, and got home  and was supposed to be on DAPT.  Did not take any of the medicines at home yet.Marland Kitchen  She was on the toilet when all of a sudden family noted that she is having right-sided weakness and not able to talk.  EMS was called-they brought the patient in for emergent evaluation with an LVO score in the field of 5.  Noncontrasted head CT unremarkable  TNK administered.  Pt with hx of diabetes, chronic pain, fibromyalgia, prior history of TIA without residual  deficits, headache, hypertension, peripheral arterial disease, sleep apnea, history of a VP shunt-   Assessment / Plan / Recommendation Clinical Impression  Pt demonstrates a mild aphasia with comprehension and expressive deficits as well as suspected mild verbal apraxia and visual impairments. Pt is pleasant, alert and cognition appears WNL. Pts is fluent with multiple paraphasic and possibly apraxic errors including vowel prolongations, distortions and increasing severity of errors with increased articulatory complexity. When word finding pt benefitted from context cues. For example when  asked to name three animals, pt said I cant think of what animals are. SLP asked her to think of animals on [place] [employer] [place] and pt named a cow a horse and a chicken. Pt identified 5/7 body parts correctly, identified 3/4 objects, and followed 50% of complex commands (put the tape on top of the glove). Her fluency and spontaneous language improved dramatically during session with good expression of wants and needs during meal and also expressing some complex ideas fluently and intelligibly with mild errors. Pt unable to write or verbally spell her name, but able to read the letters of her name. She reports she was born left handed but was trained to be right handed. Recommend f/u with OP SLP if there are no physical deficits to warrant CIR.    SLP Assessment  SLP Recommendation/Assessment: Patient needs continued Speech Lanaguage Pathology Services SLP Visit Diagnosis: Aphasia (R47.01)    Recommendations for follow up therapy are one component of a multi-disciplinary discharge planning process, led by the attending physician.  Recommendations may be updated based on patient status, additional functional criteria and insurance authorization.    Follow Up Recommendations  Outpatient SLP    Assistance Recommended at Discharge     Functional Status Assessment    Frequency and Duration min 2x/week  2 weeks      SLP Evaluation Cognition  Overall Cognitive Status: Within Functional Limits for tasks assessed Orientation Level: Other (comment) (aphasic but able to state name)       Comprehension  Auditory Comprehension Overall Auditory Comprehension: Impaired Yes/No Questions: Not tested Commands: Impaired One Step Basic Commands: 50-74% accurate Two Step Basic Commands: 75-100% accurate Multistep Basic Commands: 25-49% accurate Conversation: Simple Interfering Components: Motor planning;Visual impairments Visual Recognition/Discrimination Discrimination: Within Function Limits Reading  Comprehension Reading Status: Impaired Sentence Level: Impaired    Expression Verbal Expression Overall Verbal Expression: Impaired Initiation: No impairment Automatic Speech: Name;Social Response Repetition: Impaired Level of Impairment: Word level Naming: Impairment Responsive: 0-25% accurate Confrontation: Impaired Convergent: 50-74% accurate Divergent: 0-24% accurate Verbal Errors: Phonemic paraphasias;Aware of errors Pragmatics: No impairment Effective Techniques: Semantic cues;Articulatory cues   Oral / Motor  Oral Motor/Sensory Function Overall Oral Motor/Sensory Function: Within functional limits Motor Speech Overall Motor Speech: Impaired Respiration: Within functional limits Phonation: Normal Resonance: Within functional limits Articulation: Impaired Level of Impairment: Word Intelligibility: Intelligibility reduced Word: 25-49% accurate Phrase: 25-49% accurate Motor Planning: Impaired Level of Impairment: Phrase Motor Speech Errors: Aware;Inconsistent;Groping for words            Lynnwood Beckford, Riley Nearing 12/14/2023, 2:10 PM

## 2023-12-14 NOTE — Progress Notes (Signed)
 OT Cancellation Note  Patient Details Name: [name] MRN: [id number] DOB: [birth year]   Cancelled Treatment:    Reason Eval/Treat Not Completed: Active bedrest order with oozing from groin and IV sites as of around 4:30 this morning.  Lindon Romp OT Acute Rehabilitation Services Office [phone]    Evette Georges 12/14/2023, 7:59 AM

## 2023-12-14 NOTE — Progress Notes (Signed)
 Referring Provider(s): Dr. Milon Dikes, MD  Supervising Physician: Julieanne Cotton  Patient Status:  [facility] - In-pt  Chief Complaint: CODE STROKE, Left MCA occlusion  Brief History:  Patient was recently discharged home from [facility] s/p cardiac catheterization for STEMI. Patient had not yet taken DAPT for the day. She was on the toilet when all of a sudden family noted that she is having right-sided weakness and not able to talk. EMS was called, and patient transferred as CODE STROKE to [facility]. She underwent revascularization of distal Left MCA with Dr. Corliss Skains.  Subjective:  Patient is laying in bed having lunch, on rounds. She appears somewhat confused, and unable to find her words well.    Allergies: Cefdinir, Clindamycin/lincomycin, Codeine, Cymbalta [duloxetine hcl], Sulfamethizole, Tizanidine, Lactose, and Oxycodone  Medications: Prior to Admission medications   Medication Sig Start Date End Date Taking? Authorizing Provider  acyclovir (ZOVIRAX) 400 MG tablet Take 400 mg by mouth 3 (three) times daily as needed (fever blisters).   Yes [provider]  ALPHA LIPOIC ACID PO Take 1 capsule by mouth in the morning and at bedtime.   Yes [provider]  aspirin 81 MG tablet Take 81 mg by mouth daily.   Yes [provider]  fluticasone (FLONASE) 50 MCG/ACT nasal spray Place 1 spray into both nostrils daily as needed for allergies.   Yes [provider]  gabapentin (NEURONTIN) 300 MG capsule Take 300 mg by mouth 2 (two) times daily.   Yes [provider]  ketotifen (ZADITOR) 0.025 % ophthalmic solution Place 1 drop into both eyes daily as needed (irritation).   Yes [provider]  Lidocaine HCl (ASPERCREME LIDOCAINE) 4 % CREA Apply 1 application topically 3 (three) times daily as needed (pain).   Yes [provider]  losartan-hydrochlorothiazide (HYZAAR) 100-25 MG tablet Take 1 tablet by mouth  every evening.   Yes [provider]  magnesium oxide (MAG-OX) 400 (240 Mg) MG tablet Take 400 mg by mouth daily.   Yes [provider]  Multiple Vitamins-Minerals (ZINC PO) Take 1 tablet by mouth daily.   Yes [provider]  nystatin (NYSTATIN) powder Apply 1 application topically 3 (three) times daily as needed (skin irritation).   Yes [provider]  Omega 3 1000 MG CAPS Take 1,000 mg by mouth once a week.   Yes [provider]  pantoprazole (PROTONIX) 20 MG tablet Take 20 mg by mouth every evening.   Yes [provider]  Saccharomyces boulardii (PROBIOTIC) 250 MG CAPS Take 250 mg by mouth in the morning and at bedtime.   Yes [provider]  traMADol (ULTRAM) 50 MG tablet Take 50 mg by mouth daily as needed for moderate pain (pain score 4-6) or severe pain (pain score 7-10).   Yes [provider]  vitamin B-12 (CYANOCOBALAMIN) 100 MCG tablet Take 50 mcg by mouth once a week.   Yes [provider]  Pitavastatin Calcium 4 MG TABS Take 4 mg by mouth daily. Patient not taking: Reported on 12/14/2023    [provider]     Vital Signs: BP (!) 107/51   Pulse 66   Temp 98.3 F (36.8 C) (Axillary)   Resp 12   Ht 5\' 2"  (1.575 m)   Wt 173 lb 11.6 oz (78.8 kg)   SpO2 98%   BMI 31.77 kg/m   Physical Exam Alert, awake. Patient is oriented to self. Speech and comprehension affected, with word-finding difficulties.  PERRL bilaterally. No facial asymmetry. Tongue midline. 2/5 strength Right upper and lower extremities. 5/5 strength Left upper and lower extremities. Right femoral artery puncture site looks good, no bleeding, no hematoma, no pseudoaneurysm. Left femoral access sheath in place.   Labs:  CBC: Recent Labs    12/13/23 2009 12/13/23 2031 12/14/23 0301 12/14/23 0751  WBC 9.8  --  8.1  --   HGB 12.1 12.6 9.6* 9.7*  HCT 36.5 37.0 29.4* 29.4*  PLT 154  --  134*  --     COAGS: Recent  Labs    12/13/23 2009  INR 1.0  APTT 27    BMP: Recent Labs    12/13/23 2009 12/13/23 2031 12/14/23 0434  NA 134* 134* 134*  K 4.6 4.3 4.0  CL 99 104 106  CO2 23  --  20*  GLUCOSE 140* 137* 145*  BUN 20 23 17   CALCIUM 9.0  --  7.8*  CREATININE 0.95 1.00 0.67  GFRNONAA 59*  --  >60    LIVER FUNCTION TESTS: Recent Labs    12/13/23 2009  BILITOT 0.6  AST 75*  ALT 34  ALKPHOS 54  PROT 6.3*  ALBUMIN 3.4*    Assessment and Plan: Distal Left MCA infarct s/p revascularization with stent placement by Dr. Corliss Skains on 12/13/23 Patient assessed at bedside.  She is alert and oriented to self.   She remains with right-sided weakness.  No gaze deviation noted.  Right femoral artery access site stable without issue.  Left femoral access sheath in place, being used by nursing staff. Will d/c once nursing staff has given blood products.   Further care per Neuro and care team.   Electronically Signed: Sable Feil, PA-C 12/14/2023, 3:15 PM     I spent a total of 15 Minutes at the the patient's bedside AND on the patient's hospital floor or unit, greater than 50% of which was counseling/coordinating care for Left MCA infarct, s/p revascularization.

## 2023-12-14 NOTE — Progress Notes (Signed)
 Overnight on-call note  Reported some oozing from the groin site and IV sites.  H&H repeated.  Hemoglobin 9.6.  Came in with a hemoglobin of 12.1. Will repeat an H&H in 4 hours. Continue groin checks as per post IR protocol. Will follow  -- Milon Dikes, MD Neurologist [facility]  No charge

## 2023-12-15 ENCOUNTER — Inpatient Hospital Stay (HOSPITAL_COMMUNITY): Payer: Medicare (Managed Care)

## 2023-12-15 DIAGNOSIS — I639 Cerebral infarction, unspecified: Secondary | ICD-10-CM | POA: Diagnosis not present

## 2023-12-15 DIAGNOSIS — I63512 Cerebral infarction due to unspecified occlusion or stenosis of left middle cerebral artery: Secondary | ICD-10-CM | POA: Diagnosis not present

## 2023-12-15 LAB — BASIC METABOLIC PANEL
Anion gap: 7 (ref 5–15)
BUN: 17 mg/dL (ref 8–23)
CO2: 22 mmol/L (ref 22–32)
Calcium: 8.4 mg/dL — ABNORMAL LOW (ref 8.9–10.3)
Chloride: 105 mmol/L (ref 98–111)
Creatinine, Ser: 0.81 mg/dL (ref 0.44–1.00)
GFR, Estimated: >60 mL/min (ref >60)
Glucose, Bld: 120 mg/dL — ABNORMAL HIGH (ref 70–99)
Potassium: 4 mmol/L (ref 3.5–5.1)
Sodium: 134 mmol/L — ABNORMAL LOW (ref 135–145)

## 2023-12-15 LAB — CBC
HCT: 28 % — ABNORMAL LOW (ref 36.0–46.0)
Hemoglobin: 9.2 g/dL — ABNORMAL LOW (ref 12.0–15.0)
MCH: 30.3 pg (ref 26.0–34.0)
MCHC: 32.9 g/dL (ref 30.0–36.0)
MCV: 92.1 fL (ref 80.0–100.0)
Platelets: 137 10*3/uL — ABNORMAL LOW (ref 150–400)
RBC: 3.04 MIL/uL — ABNORMAL LOW (ref 3.87–5.11)
RDW: 13.6 % (ref 11.5–15.5)
WBC: 8.2 10*3/uL (ref 4.0–10.5)
nRBC: 0 % (ref 0.0–0.2)

## 2023-12-15 NOTE — Progress Notes (Signed)
 Inpatient Rehab Admissions Coordinator Note:   Per PT patient was screened for CIR candidacy by Insiya Oshea Luvenia Starch, CCC-SLP. At this time, pt appears to be a potential candidate for CIR. I will place an order for rehab consult for full assessment, per our protocol.  Please contact me any with questions.Wolfgang Phoenix, MS, CCC-SLP Admissions Coordinator [phone] 12/15/23 5:52 PM

## 2023-12-15 NOTE — Evaluation (Signed)
 Physical Therapy Evaluation Patient Details Name: Brittany Atkins MRN: [id number] DOB: 1936/08/29 Today's Date: 12/15/2023  History of Present Illness  Pt is 88 yo who presented to [facility] ED on 3/20 after being discharged earlier in the day after cardiac cath from [facility]. Pt went into the bathroom and started to experience slurred speech, R sided facial droop and R sided weakness. Pt was found to have CVA and TNK was administered on 3/20 and sent to IR for re-vascularization. PMH: VP shunt, DM II, CAD, STEMI and stent placement.  Clinical Impression  Pt is presenting below baseline level of functioning. Currently pt is Min A for bed mobility, Mod a for sit to stand and Min A for transfers with RW and very short distance gait. Pt is very motivated. Good re-call of instructions after safety instructions provided for sit to stand. Pt has supportive family that can provide 24/7 assist. Due to pt current functional status, home set up and available assistance at home recommending skilled physical therapy services > 3 hours/day in order to address strength, balance and functional mobility to decrease risk for falls, injury, immobility, skin break down and re-hospitalization.          If plan is discharge home, recommend the following: A lot of help with walking and/or transfers;Assistance with cooking/housework;Assist for transportation     Equipment Recommendations BSC/3in1  Recommendations for Other Services  Rehab consult    Functional Status Assessment Patient has had a recent decline in their functional status and demonstrates the ability to make significant improvements in function in a reasonable and predictable amount of time.     Precautions / Restrictions Precautions Precautions: Fall Recall of Precautions/Restrictions: Intact Restrictions Weight Bearing Restrictions Per Provider Order: No      Mobility  Bed Mobility Overal bed mobility: Needs Assistance Bed  Mobility: Supine to Sit     Supine to sit: Contact guard     General bed mobility comments: increased time and verbal cues for sequencing    Transfers Overall transfer level: Needs assistance Equipment used: Rolling walker (2 wheels) Transfers: Sit to/from Stand, Bed to chair/wheelchair/BSC Sit to Stand: Mod assist   Step pivot transfers: Min assist       General transfer comment: Mod A for initial momentum to get to standing from sitting and verbal cues for safe hand placement. Min A for balance with step pivot transfer and assist navigating AD    Ambulation/Gait   Pre-gait activities: Attempting stepping from EOB to recliner and then taking steps from recliner. Pt was fatigued and unable to progress further than 4 ft. Pt has short step length bil with slight decrease in stance time on the LLE. General Gait Details: Deferred due to fatigue    Modified Rankin (Stroke Patients Only) Modified Rankin (Stroke Patients Only) Pre-Morbid Rankin Score: No symptoms Modified Rankin: Moderate disability     Balance Overall balance assessment: Needs assistance Sitting-balance support: No upper extremity supported, Feet supported Sitting balance-Leahy Scale: Good     Standing balance support: Bilateral upper extremity supported, Reliant on assistive device for balance Standing balance-Leahy Scale: Poor Standing balance comment: Min A during dynamic movement         Pertinent Vitals/Pain Pain Assessment Pain Assessment: No/denies pain    Home Living Family/patient expects to be discharged to:: Private residence Living Arrangements: Spouse/significant other Available Help at Discharge: Family;Available 24 hours/day Type of Home: House Home Access: Level entry       Home Layout:  One level Home Equipment: Rollator (4 wheels);Tub bench;Rolling Walker (2 wheels);Lift chair;Other (comment) (life alert)      Prior Function Prior Level of Function : Independent/Modified  Independent             Mobility Comments: Pt denies falls in the past 6 months, uses rollator at all times ADLs Comments: mod I with all ADL"s     Extremity/Trunk Assessment   Upper Extremity Assessment Upper Extremity Assessment: Defer to OT evaluation    Lower Extremity Assessment Lower Extremity Assessment: Generalized weakness;LLE deficits/detail LLE Deficits / Details: LLE demonstrates 3-/5 compared to 3+/5 on RLE    Cervical / Trunk Assessment Cervical / Trunk Assessment: Normal  Communication   Communication Communication: Impaired Factors Affecting Communication: Difficulty expressing self (difficulty with word finding)    Cognition Arousal: Alert Behavior During Therapy: WFL for tasks assessed/performed   PT - Cognitive impairments: No apparent impairments     Following commands: Intact       Cueing Cueing Techniques: Verbal cues     General Comments General comments (skin integrity, edema, etc.): No noted skin issues outside of gown. pt demonstrates no signs/symptoms of cardiac/respirtory distress throughout session.        Assessment/Plan    PT Assessment Patient needs continued PT services  PT Problem List Decreased strength;Decreased mobility;Decreased activity tolerance;Decreased safety awareness;Decreased balance       PT Treatment Interventions DME instruction;Therapeutic exercise;Gait training;Balance training;Stair training;Neuromuscular re-education;Functional mobility training;Therapeutic activities;Patient/family education    PT Goals (Current goals can be found in the Care Plan section)  Acute Rehab PT Goals Patient Stated Goal: to improve mobility PT Goal Formulation: With patient Time For Goal Achievement: 12/29/23 Potential to Achieve Goals: Good    Frequency Min 2X/week        AM-PAC PT "6 Clicks" Mobility  Outcome Measure Help needed turning from your back to your side while in a flat bed without using bedrails?: A  Little Help needed moving from lying on your back to sitting on the side of a flat bed without using bedrails?: A Little Help needed moving to and from a bed to a chair (including a wheelchair)?: A Lot Help needed standing up from a chair using your arms (e.g., wheelchair or bedside chair)?: A Lot Help needed to walk in hospital room?: A Lot Help needed climbing 3-5 steps with a railing? : Total 6 Click Score: 13    End of Session Equipment Utilized During Treatment: Gait belt Activity Tolerance: Patient tolerated treatment well;Patient limited by fatigue Patient left: in chair;with call bell/phone within reach;with chair alarm set Nurse Communication: Mobility status PT Visit Diagnosis: Other abnormalities of gait and mobility (R26.89)    Time: 4098-1191 PT Time Calculation (min) (ACUTE ONLY): 41 min   Charges:   PT Evaluation $PT Eval Low Complexity: 1 Low PT Treatments $Therapeutic Activity: 23-37 mins PT General Charges $$ ACUTE PT VISIT: 1 Visit        Harrel Carina, DPT, CLT  Acute Rehabilitation Services Office: [phone] (Secure chat preferred)   Claudia Desanctis 12/15/2023, 4:40 PM

## 2023-12-15 NOTE — Plan of Care (Signed)
  Problem: Ischemic Stroke/TIA Tissue Perfusion: Goal: Complications of ischemic stroke/TIA will be minimized Outcome: Progressing   Problem: Clinical Measurements: Goal: Ability to maintain clinical measurements within normal limits will improve Outcome: Progressing Goal: Will remain free from infection Outcome: Progressing Goal: Diagnostic test results will improve Outcome: Progressing Goal: Respiratory complications will improve Outcome: Progressing Goal: Cardiovascular complication will be avoided Outcome: Progressing   Problem: Skin Integrity: Goal: Risk for impaired skin integrity will decrease Outcome: Progressing   Problem: Safety: Goal: Ability to remain free from injury will improve Outcome: Progressing   Problem: Pain Managment: Goal: General experience of comfort will improve and/or be controlled Outcome: Progressing   Problem: Coping: Goal: Level of anxiety will decrease Outcome: Progressing

## 2023-12-15 NOTE — Progress Notes (Addendum)
 STROKE TEAM PROGRESS NOTE    SIGNIFICANT HOSPITAL EVENTS 3/20 received IV TNK for acute onset of right-sided weakness and aphasia CTA with left M1 occlusion at the bifurcation and a concomitant left M2 branch occlusion.  Went to IR  INTERIM HISTORY/SUBJECTIVE No family at the bedside.  No new neurological events overnight She is sitting in the chair after having just worked with therapy in no apparent distress Labs and vitals are stable Neurological exam remains stable and unchanged and she still has mild expressive aphasia.  OBJECTIVE  CBC    Component Value Date/Time   WBC 8.2 12/15/2023 0609   RBC 3.04 (L) 12/15/2023 0609   HGB 9.2 (L) 12/15/2023 0609   HCT 28.0 (L) 12/15/2023 0609   PLT 137 (L) 12/15/2023 0609   MCV 92.1 12/15/2023 0609   MCH 30.3 12/15/2023 0609   MCHC 32.9 12/15/2023 0609   RDW 13.6 12/15/2023 0609   LYMPHSABS 0.7 12/14/2023 0301   MONOABS 0.6 12/14/2023 0301   EOSABS 0.0 12/14/2023 0301   BASOSABS 0.0 12/14/2023 0301    BMET    Component Value Date/Time   NA 134 (L) 12/15/2023 0609   K 4.0 12/15/2023 0609   CL 105 12/15/2023 0609   CO2 22 12/15/2023 0609   GLUCOSE 120 (H) 12/15/2023 0609   BUN 17 12/15/2023 0609   CREATININE 0.81 12/15/2023 0609   CALCIUM 8.4 (L) 12/15/2023 0609   GFRNONAA >60 12/15/2023 0609    IMAGING past 24 hours MR BRAIN WO CONTRAST Result Date: 12/14/2023 CLINICAL DATA:  Follow-up examination for stroke. EXAM: MRI HEAD WITHOUT CONTRAST MRA HEAD WITHOUT CONTRAST TECHNIQUE: Multiplanar, multi-echo pulse sequences of the brain and surrounding structures were acquired without intravenous contrast. Angiographic images of the Circle of Willis were acquired using MRA technique without intravenous contrast. COMPARISON:  Prior studies from 12/13/2023 FINDINGS: MRI HEAD FINDINGS Brain: Examination mildly limited by motion and susceptibility artifact from shunt reservoir or at the right occipital scalp. Cerebral volume within normal  limits. Patchy T2/FLAIR hyperintensity involving the periventricular deep white matter both cerebral hemispheres, most characteristic of chronic microvascular ischemic disease, moderate in nature. Patchy restricted diffusion involving the left insula and overlying left frontal parietal and occipital lobes, consistent with an evolving acute left MCA distribution infarct (series 2, images 18-36). Additional subcentimeter acute ischemic infarct present at the subcortical right frontal lobe (series 2, image 38). No associated hemorrhage or mass effect. Gray-white matter differentiation otherwise maintained. No other acute or chronic intracranial blood products. Right parietal approach shunt catheter in place with tip terminating in the left lateral ventricle. Stable ventricular size and morphology without hydrocephalus or transependymal flow of CSF. No mass lesion or midline shift. No extra-axial fluid collection. Pituitary gland within normal limits. Vascular: Major intracranial vascular flow voids are maintained. Skull and upper cervical spine: Craniocervical junction within normal limits. Bone marrow signal intensity normal. No acute scalp soft tissue abnormality. Sinuses/Orbits: Prior bilateral ocular lens replacement. Paranasal sinuses are clear. No significant mastoid effusion. Other: None. MRA HEAD FINDINGS Anterior circulation: Visualized distal cervical segments of the internal carotid arteries are patent with antegrade flow. Mild atheromatous irregularity about the carotid siphons without stenosis or other abnormality. A1 segments patent bilaterally. Normal anterior communicating artery complex. Anterior cerebral arteries patent without stenosis. No M1 stenosis. There has been interval revascularization of previously seen distal left MCA occlusion. Left MCA branches are now patent and well perfused bilaterally. Posterior circulation: Both V4 segments patent without stenosis. Both PICA patent. Atheromatous  irregularity  about the proximal-mid basilar artery with associated moderate multifocal stenoses (series 1054, image 9). Superior cerebral arteries patent bilaterally. Right PCA supplied via the basilar. Fetal type origin left PCA. Focal moderate mid right P2 stenosis (series 1054, image 11). Left PCA patent without significant stenosis. Anatomic variants: As above.  No aneurysm. IMPRESSION: MRI HEAD: 1. Evolving acute left MCA distribution infarct. No associated hemorrhage or mass effect. 2. Additional subcentimeter acute ischemic nonhemorrhagic subcortical right frontal lobe infarct. 3. Underlying moderate chronic microvascular ischemic disease. 4. Right parietal approach shunt catheter in place with tip terminating in the left lateral ventricle. Stable ventricular size and morphology without hydrocephalus. MRA HEAD: 1. Interval revascularization of previously seen distal left MCA occlusion. No residual or recurrent large vessel occlusion. 2. Intracranial atherosclerotic disease about the posterior circulation with moderate multifocal stenoses involving the basilar artery and right P2 segment. Electronically Signed   By: Rise Mu M.D.   On: 12/14/2023 21:49   MR ANGIO HEAD WO CONTRAST Result Date: 12/14/2023 CLINICAL DATA:  Follow-up examination for stroke. EXAM: MRI HEAD WITHOUT CONTRAST MRA HEAD WITHOUT CONTRAST TECHNIQUE: Multiplanar, multi-echo pulse sequences of the brain and surrounding structures were acquired without intravenous contrast. Angiographic images of the Circle of Willis were acquired using MRA technique without intravenous contrast. COMPARISON:  Prior studies from 12/13/2023 FINDINGS: MRI HEAD FINDINGS Brain: Examination mildly limited by motion and susceptibility artifact from shunt reservoir or at the right occipital scalp. Cerebral volume within normal limits. Patchy T2/FLAIR hyperintensity involving the periventricular deep white matter both cerebral hemispheres, most  characteristic of chronic microvascular ischemic disease, moderate in nature. Patchy restricted diffusion involving the left insula and overlying left frontal parietal and occipital lobes, consistent with an evolving acute left MCA distribution infarct (series 2, images 18-36). Additional subcentimeter acute ischemic infarct present at the subcortical right frontal lobe (series 2, image 38). No associated hemorrhage or mass effect. Gray-white matter differentiation otherwise maintained. No other acute or chronic intracranial blood products. Right parietal approach shunt catheter in place with tip terminating in the left lateral ventricle. Stable ventricular size and morphology without hydrocephalus or transependymal flow of CSF. No mass lesion or midline shift. No extra-axial fluid collection. Pituitary gland within normal limits. Vascular: Major intracranial vascular flow voids are maintained. Skull and upper cervical spine: Craniocervical junction within normal limits. Bone marrow signal intensity normal. No acute scalp soft tissue abnormality. Sinuses/Orbits: Prior bilateral ocular lens replacement. Paranasal sinuses are clear. No significant mastoid effusion. Other: None. MRA HEAD FINDINGS Anterior circulation: Visualized distal cervical segments of the internal carotid arteries are patent with antegrade flow. Mild atheromatous irregularity about the carotid siphons without stenosis or other abnormality. A1 segments patent bilaterally. Normal anterior communicating artery complex. Anterior cerebral arteries patent without stenosis. No M1 stenosis. There has been interval revascularization of previously seen distal left MCA occlusion. Left MCA branches are now patent and well perfused bilaterally. Posterior circulation: Both V4 segments patent without stenosis. Both PICA patent. Atheromatous irregularity about the proximal-mid basilar artery with associated moderate multifocal stenoses (series 1054, image 9).  Superior cerebral arteries patent bilaterally. Right PCA supplied via the basilar. Fetal type origin left PCA. Focal moderate mid right P2 stenosis (series 1054, image 11). Left PCA patent without significant stenosis. Anatomic variants: As above.  No aneurysm. IMPRESSION: MRI HEAD: 1. Evolving acute left MCA distribution infarct. No associated hemorrhage or mass effect. 2. Additional subcentimeter acute ischemic nonhemorrhagic subcortical right frontal lobe infarct. 3. Underlying moderate chronic microvascular ischemic disease. 4. Right  parietal approach shunt catheter in place with tip terminating in the left lateral ventricle. Stable ventricular size and morphology without hydrocephalus. MRA HEAD: 1. Interval revascularization of previously seen distal left MCA occlusion. No residual or recurrent large vessel occlusion. 2. Intracranial atherosclerotic disease about the posterior circulation with moderate multifocal stenoses involving the basilar artery and right P2 segment. Electronically Signed   By: Rise Mu M.D.   On: 12/14/2023 21:49    Vitals:   12/15/23 0430 12/15/23 0737 12/15/23 0825 12/15/23 1202  BP: (!) 107/51 (!) 117/51 (!) 117/51 (!) 133/55  Pulse:  73  67  Resp:  16    Temp: 97.8 F (36.6 C) 98.7 F (37.1 C)  99.1 F (37.3 C)  TempSrc: Axillary Axillary  Oral  SpO2:  95%  93%  Weight:      Height:         PHYSICAL EXAM General: Critically ill elderly female Psych:  Mood and affect appropriate for situation CV: Regular rate and rhythm on monitor Respiratory:  Regular, unlabored respirations on cannula GI: Abdomen soft and nontender   NEURO:  Mental Status: She is awake alert and oriented self, expressive aphasia with mild dysarthria, she was able to state her name, still having word finding difficulties  Cranial Nerves:  II: PERRL. Visual fields full.  III, IV, VI: EOMI. Eyelids elevate symmetrically.  V: Sensation is intact to light touch and symmetrical  to face.  VII: Right facial VIII: hearing intact to voice. IX, X: Palate elevates symmetrically. Phonation is normal.  WU:[id number] shrug 5/5. XII: tongue is midline without fasciculations. Motor: right arm 4/5, right leg with mild drift 4/5, left arm and leg 5/5, Tone: is normal and bulk is normal Sensation- Intact to light touch bilaterally. Extinction absent to light touch to DSS.   Coordination: FTN intact bilaterally, HKS: no ataxia in BLE.No drift.  Gait- deferred  Most Recent NIH 4   ASSESSMENT/PLAN  [name] is a 88 y.o. female with history of diabetes, chronic pain, fibromyalgia, prior history of TIA without residual deficits, headache, hypertension, peripheral arterial disease, hyperlipidemia, thyroid disease, chronic pain sleep apnea, history of a VP shunt, recent admission at [facility] hospital for STEMI with cardiac catheterization/PCI-presented to the emergency department for evaluation of right-sided weakness and aphasia as a code stroke.  NIH 20  Stroke:  left MCA scattered infarcts and right MCA punctate infarct with left M1/M2 occlusion s/p IV TNK and IR with TICI 3, etiology: Likely cardioembolic source with recent STEMI vs. Occult afib Code Stroke CT head No acute abnormality. Small vessel disease. Atrophy. ASPECTS 10.    CTA head & neck occlusion of the distal left M1 segment at the level of the left MCA bifurcation. Additional downstream proximal left M2 occlusion Status post IR with left M1/M2 occlusion and TICI3 Post IR CT no hemorrhage MRI left MCA scattered infarct with right MCA punctate infarct MRA left MCA now patent 2D Echo EF 70 to 75%.  Mild LVH with grade 1 diastolic dysfunction LE venous Doppler pending Consider loop recorder prior to discharge LDL 112 HgbA1c 6.6 VTE prophylaxis -Lovenox No antithrombotic (aspirin and Brilinta post discharge on 3/20 however patient was not taking) prior to admission, now on ASA and  brilinta given  stent Therapy recommendations:  Pending Disposition: Pending  CAD STEMI status post cardiac stent recently discharged from [facility] after getting a cardiac catheterization for STEMI Discharged home 3/20 and on DAPT.  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 Agree with note above.  Patient continues to have expressive aphasia but is improving.  Continue dual antiplatelet therapy given stent.  Continue ongoing therapies.  Mobilize out of bed.  Await therapy evaluation about disposition.  No family available at the bedside.  Greater than 50% time during this 35-minute visit was spent on counseling and coordination of care and discussion about her aphasia and stroke prevention infection.  Delia Heady, MD Medical Director [facility] Pager: [phone] 12/15/2023 3:15 PM     To contact Stroke Continuity provider, please refer to [email or web address]. After hours, contact General Neurology

## 2023-12-15 NOTE — Progress Notes (Signed)
 VASCULAR LAB    Bilateral lower extremity venous duplex has been performed.  See CV proc for preliminary results.   Michalene Debruler, RVT 12/15/2023, 5:56 PM

## 2023-12-15 NOTE — Progress Notes (Signed)
 Tx to 3W 12

## 2023-12-15 NOTE — Progress Notes (Signed)
 Admission Notes  01:20: Received pt from 4N ICU on bed, accompanied by nurse. Alert and oriented, not in pain. Safety precautions initiated: Side rails up, bed wheels locked and call bell within reach. Hooked on monitor: [id number].

## 2023-12-16 DIAGNOSIS — I63512 Cerebral infarction due to unspecified occlusion or stenosis of left middle cerebral artery: Secondary | ICD-10-CM | POA: Diagnosis not present

## 2023-12-16 LAB — BASIC METABOLIC PANEL
Anion gap: 9 (ref 5–15)
BUN: 17 mg/dL (ref 8–23)
CO2: 20 mmol/L — ABNORMAL LOW (ref 22–32)
Calcium: 8.2 mg/dL — ABNORMAL LOW (ref 8.9–10.3)
Chloride: 104 mmol/L (ref 98–111)
Creatinine, Ser: 0.79 mg/dL (ref 0.44–1.00)
GFR, Estimated: >60 mL/min (ref >60)
Glucose, Bld: 116 mg/dL — ABNORMAL HIGH (ref 70–99)
Potassium: 3.9 mmol/L (ref 3.5–5.1)
Sodium: 133 mmol/L — ABNORMAL LOW (ref 135–145)

## 2023-12-16 LAB — URINALYSIS, W/ REFLEX TO CULTURE (INFECTION SUSPECTED)
Bilirubin Urine: NEGATIVE
Glucose, UA: NEGATIVE mg/dL
Ketones, ur: NEGATIVE mg/dL
Nitrite: NEGATIVE
Protein, ur: NEGATIVE mg/dL
Specific Gravity, Urine: <1.005 — ABNORMAL LOW (ref 1.005–1.030)
pH: 6 (ref 5.0–8.0)

## 2023-12-16 LAB — CBC
HCT: 28.3 % — ABNORMAL LOW (ref 36.0–46.0)
Hemoglobin: 9.4 g/dL — ABNORMAL LOW (ref 12.0–15.0)
MCH: 30 pg (ref 26.0–34.0)
MCHC: 33.2 g/dL (ref 30.0–36.0)
MCV: 90.4 fL (ref 80.0–100.0)
Platelets: 146 10*3/uL — ABNORMAL LOW (ref 150–400)
RBC: 3.13 MIL/uL — ABNORMAL LOW (ref 3.87–5.11)
RDW: 13.4 % (ref 11.5–15.5)
WBC: 8 10*3/uL (ref 4.0–10.5)
nRBC: 0 % (ref 0.0–0.2)

## 2023-12-16 NOTE — Progress Notes (Signed)
 STROKE TEAM PROGRESS NOTE    SIGNIFICANT HOSPITAL EVENTS 3/20 received IV TNK for acute onset of right-sided weakness and aphasia CTA with left M1 occlusion at the bifurcation and a concomitant left M2 branch occlusion.  Went to IR  INTERIM HISTORY/SUBJECTIVE No family at the bedside.  No new neurological events overnight She is sitting up comfortably in bed in no apparent distress Labs and vitals are stable Neurological exam remains stable and unchanged and she still has mild expressive aphasia but it is improved compared to yesterday.  OBJECTIVE  CBC    Component Value Date/Time   WBC 8.0 12/16/2023 0641   RBC 3.13 (L) 12/16/2023 0641   HGB 9.4 (L) 12/16/2023 0641   HCT 28.3 (L) 12/16/2023 0641   PLT 146 (L) 12/16/2023 0641   MCV 90.4 12/16/2023 0641   MCH 30.0 12/16/2023 0641   MCHC 33.2 12/16/2023 0641   RDW 13.4 12/16/2023 0641   LYMPHSABS 0.7 12/14/2023 0301   MONOABS 0.6 12/14/2023 0301   EOSABS 0.0 12/14/2023 0301   BASOSABS 0.0 12/14/2023 0301    BMET    Component Value Date/Time   NA 133 (L) 12/16/2023 0641   K 3.9 12/16/2023 0641   CL 104 12/16/2023 0641   CO2 20 (L) 12/16/2023 0641   GLUCOSE 116 (H) 12/16/2023 0641   BUN 17 12/16/2023 0641   CREATININE 0.79 12/16/2023 0641   CALCIUM 8.2 (L) 12/16/2023 0641   GFRNONAA >60 12/16/2023 0641    IMAGING past 24 hours VAS [place] LOWER EXTREMITY VENOUS (DVT) Result Date: 12/15/2023  Lower Venous DVT Study Patient Name:  Brittany Atkins  Date of Exam:   12/15/2023 Medical Rec #: [id number]                 Accession #:    [id number] Date of Birth: 1936-07-24                  Patient Gender: F Patient Age:   88 years Exam Location:  [facility] Procedure:      VAS [place] LOWER EXTREMITY VENOUS (DVT) Referring Phys: Scheryl Marten XU --------------------------------------------------------------------------------  Indications: Stroke.  Limitations: Patient unable to tolerate compressions in left thigh and bandages in  right groin from thrombectomy. Comparison Study: Prior negative left LEV done 11/13/23 Performing Technologist: Sherren Kerns RVS  Examination Guidelines: A complete evaluation includes B-mode imaging, spectral Doppler, color Doppler, and power Doppler as needed of all accessible portions of each vessel. Bilateral testing is considered an integral part of a complete examination. Limited examinations for reoccurring indications may be performed as noted. The reflux portion of the exam is performed with the patient in reverse Trendelenburg.  +---------+---------------+---------+-----------+---------------+--------------+ RIGHT    CompressibilityPhasicitySpontaneityProperties     Thrombus Aging +---------+---------------+---------+-----------+---------------+--------------+ CFV                                                        not visualized                                                            (bandage)      +---------+---------------+---------+-----------+---------------+--------------+ SFJ  not visualized                                                            (bandage)      +---------+---------------+---------+-----------+---------------+--------------+ FV Prox  Full           Yes      No         pulsatile                                                                 waveform                      +---------+---------------+---------+-----------+---------------+--------------+ FV Mid   Full                                                             +---------+---------------+---------+-----------+---------------+--------------+ FV DistalFull                                                             +---------+---------------+---------+-----------+---------------+--------------+ PFV      Full           Yes      No         pulsatile                                                                  waveform                      +---------+---------------+---------+-----------+---------------+--------------+ POP      Full           Yes      No         pulsatile                                                                 waveform                      +---------+---------------+---------+-----------+---------------+--------------+ PTV      Full                                                             +---------+---------------+---------+-----------+---------------+--------------+  PERO     Full                                                             +---------+---------------+---------+-----------+---------------+--------------+   +---------+---------------+---------+-----------+---------------+--------------+ LEFT     CompressibilityPhasicitySpontaneityProperties     Thrombus Aging +---------+---------------+---------+-----------+---------------+--------------+ CFV      Full           No       No         pulsatile                                                                 waveform                      +---------+---------------+---------+-----------+---------------+--------------+ SFJ      Full                                                             +---------+---------------+---------+-----------+---------------+--------------+ FV Prox  Full                                                             +---------+---------------+---------+-----------+---------------+--------------+ FV Mid                  Yes      No         pulsatile      patent by                                                  waveform       color and                                                                 Doppler        +---------+---------------+---------+-----------+---------------+--------------+ FV Distal               Yes      Yes        pulsatile      patent by                                                   waveform  color and                                                                 Doppler        +---------+---------------+---------+-----------+---------------+--------------+ PFV      Full                                                             +---------+---------------+---------+-----------+---------------+--------------+ POP      Full           Yes      Yes        pulsatile                                                                 waveform                      +---------+---------------+---------+-----------+---------------+--------------+ PTV      Full                                                             +---------+---------------+---------+-----------+---------------+--------------+ PERO     Full                                                             +---------+---------------+---------+-----------+---------------+--------------+    Summary: RIGHT: - There is no evidence of deep vein thrombosis in the lower extremity. However, portions of this examination were limited- see technologist comments above.  - No cystic structure found in the popliteal fossa. Pulsatile waveforms  LEFT: - There is no evidence of deep vein thrombosis in the lower extremity.  - No cystic structure found in the popliteal fossa. Pulsatile waveforms.  *See table(s) above for measurements and observations.    Preliminary     Vitals:   12/16/23 0354 12/16/23 0800 12/16/23 1241 12/16/23 1540  BP: (!) 136/55 (!) 138/55 (!) 133/45 (!) 134/57  Pulse: 64 68 61 64  Resp: 18     Temp: 98.2 F (36.8 C) 98.8 F (37.1 C) 98.3 F (36.8 C) 97.8 F (36.6 C)  TempSrc: Oral Oral Oral Oral  SpO2: 95% 94% 98% 98%  Weight:      Height:         PHYSICAL EXAM General: Critically ill elderly female Psych:  Mood and affect appropriate for situation CV: Regular rate and rhythm on monitor Respiratory:  Regular, unlabored respirations on  cannula GI: Abdomen soft and nontender   NEURO:  Mental  Status: She is awake alert and oriented self, expressive aphasia with mild dysarthria, she was able to state her name, still having word finding difficulties  Cranial Nerves:  II: PERRL. Visual fields full.  III, IV, VI: EOMI. Eyelids elevate symmetrically.  V: Sensation is intact to light touch and symmetrical to face.  VII: Right facial VIII: hearing intact to voice. IX, X: Palate elevates symmetrically. Phonation is normal.  NF:AOZHYQMV shrug 5/5. XII: tongue is midline without fasciculations. Motor: right arm 4/5, right leg with mild drift 4/5, left arm and leg 5/5, Tone: is normal and bulk is normal Sensation- Intact to light touch bilaterally. Extinction absent to light touch to DSS.   Coordination: FTN intact bilaterally, HKS: no ataxia in BLE.No drift.  Gait- deferred  Most Recent NIH 4   ASSESSMENT/PLAN  [name] is a 88 y.o. female with history of diabetes, chronic pain, fibromyalgia, prior history of TIA without residual deficits, headache, hypertension, peripheral arterial disease, hyperlipidemia, thyroid disease, chronic pain sleep apnea, history of a VP shunt, recent admission at [facility] hospital for STEMI with cardiac catheterization/PCI-presented to the emergency department for evaluation of right-sided weakness and aphasia as a code stroke.  NIH 20  Stroke:  left MCA scattered infarcts and right MCA punctate infarct with left M1/M2 occlusion s/p IV TNK and IR with TICI 3, etiology: Likely cardioembolic source with recent STEMI vs. Occult afib Code Stroke CT head No acute abnormality. Small vessel disease. Atrophy. ASPECTS 10.    CTA head & neck occlusion of the distal left M1 segment at the level of the left MCA bifurcation. Additional downstream proximal left M2 occlusion Status post IR with left M1/M2 occlusion and TICI3 Post IR CT no hemorrhage MRI left MCA scattered infarct  with right MCA punctate infarct MRA left MCA now patent 2D Echo EF 70 to 75%.  Mild LVH with grade 1 diastolic dysfunction LE venous Doppler pending Consider loop recorder prior to discharge LDL 112 HgbA1c 6.6 VTE prophylaxis -Lovenox No antithrombotic (aspirin and Brilinta post discharge on 3/20 however patient was not taking) prior to admission, now on ASA and brilinta given  stent Therapy recommendations:  Pending Disposition: Pending  CAD STEMI status post cardiac stent recently discharged from [facility] after getting a cardiac catheterization for STEMI Discharged home 3/20 and on DAPT.  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  Continue dual antiplatelet therapy given stent.  Continue ongoing therapies.  Mobilize out of bed.  Await therapy evaluation about disposition.  No family available at the bedside.  Brittany Heady, MD Medical Director [facility] Pager: [phone] 12/16/2023 3:41 PM     To contact Stroke Continuity provider, please refer to [email or web address]. After hours, contact General Neurology

## 2023-12-16 NOTE — Progress Notes (Signed)
 Inpatient Rehab Admissions:  Inpatient Rehab Consult received.  I met with patient at the bedside for rehabilitation assessment and to discuss goals and expectations of an inpatient rehab admission.  Discussed average length of stay, insurance authorization requirement, and discharge home after completion of CIR. Pt acknowledged understanding. Pt interested in pursuing CIR. Pt gave permission to contact daughter Jethro Bolus. Spoke to [place] on the telephone. She also acknowledged understanding. She confirmed that pt's husband can provide 24/7 support in the form of supervision and very light physical assistance. Jethro Bolus also confirmed that she would be able to provide intermittent support for pt after discharge. Jethro Bolus is supportive of pt pursuing CIR. Both Jethro Bolus  and pt are unsure if pt's insurance will approve CIR. Will continue to follow.   Signed: Wolfgang Phoenix, MS, CCC-SLP Admissions Coordinator [phone]

## 2023-12-16 NOTE — Evaluation (Signed)
 Occupational Therapy Evaluation Patient Details Name: Brittany Atkins MRN: [id number] DOB: [birth year] Today's Date: 12/16/2023   History of Present Illness   Pt is 88 yo who presented to [facility] ED on 3/20 after being discharged earlier in the day after cardiac cath from [facility]. Pt went into the bathroom and started to experience slurred speech, R sided facial droop and R sided weakness. MRI (+) left MCA CVA; subcentimeter acute ischemic nonhemorrhagic subcortical right frontal lobe. TNK 3/20 and sent to IR for re-vascularization. PMH: VP shunt, DM II, CAD, STEMI and stent placement.     Clinical Impressions PTA pt lives @ a modified independent level with her husband. At baseline, Brittany Atkins uses a rollator and her husband assists her with LB dressing as needed due to limitations in RLE ROM/back issues. Pt is making excellent progress and is able to ambulate to the bathroom with CGA and complete LB ADL with min A @ RW level with VSS. Pt feels her primary deficit at this time is her speech and says "I'm getting better everyday". Pt will benefit from intensive inpatient follow-up therapy, >3 hours/day pending progress. Acute OT will follow to facilitate safe DC to next venue of care.      If plan is discharge home, recommend the following:   A little help with walking and/or transfers;A little help with bathing/dressing/bathroom;Assistance with cooking/housework;Assist for transportation     Functional Status Assessment   Patient has had a recent decline in their functional status and demonstrates the ability to make significant improvements in function in a reasonable and predictable amount of time.     Equipment Recommendations   None recommended by OT     Recommendations for Other Services         Precautions/Restrictions   Precautions Precautions: Fall Recall of Precautions/Restrictions: Intact Restrictions Weight Bearing Restrictions Per Provider  Order: No     Mobility Bed Mobility Overal bed mobility: Modified Independent    Pt sleeps in an office chair/lift chair              Transfers Overall transfer level: Needs assistance Equipment used: Rolling walker (2 wheels) Transfers: Sit to/from Stand, Bed to chair/wheelchair/BSC Sit to Stand: Contact guard assist                  Balance Overall balance assessment: Needs assistance Sitting-balance support: No upper extremity supported, Feet supported Sitting balance-Leahy Scale: Good     Standing balance support: Bilateral upper extremity supported, Reliant on assistive device for balance Standing balance-Leahy Scale: Poor Standing balance comment: using a rollator at baseline                           ADL either performed or assessed with clinical judgement   ADL Overall ADL's : Needs assistance/impaired Eating/Feeding: Modified independent   Grooming: Set up;Sitting   Upper Body Bathing: Set up;Sitting   Lower Body Bathing: Minimal assistance;Sit to/from stand   Upper Body Dressing : Set up;Standing   Lower Body Dressing: Minimal assistance;Sit to/from stand   Toilet Transfer: Contact guard assist;Ambulation;Rolling walker (2 wheels)   Toileting- Clothing Manipulation and Hygiene: Contact guard assist;Sit to/from stand       Functional mobility during ADLs: Contact guard assist;Rolling walker (2 wheels);Cueing for safety       Vision Baseline Vision/History: 1 Wears glasses (reading only) Ability to See in Adequate Light: 0 Adequate Patient Visual Report: Blurring of vision Vision Assessment?:  Yes Eye Alignment: Within Functional Limits Ocular Range of Motion: Within Functional Limits Alignment/Gaze Preference: Within Defined Limits Tracking/Visual Pursuits: Able to track stimulus in all quads without difficulty Saccades: Within functional limits Convergence: Within functional limits Visual Fields: No apparent  deficits Additional Comments: reports vision is "just a little blurry" will further assess     Perception         Praxis         Pertinent Vitals/Pain Pain Assessment Pain Assessment: No/denies pain     Extremity/Trunk Assessment Upper Extremity Assessment Upper Extremity Assessment: Right hand dominant;LUE deficits/detail LUE Deficits / Details: AROM/Strength overall WFL however mildingweaker on the R; mild inccordiantion problems however using functionally without difficulty   Lower Extremity Assessment Lower Extremity Assessment: Defer to PT evaluation   Cervical / Trunk Assessment Cervical / Trunk Assessment: Normal   Communication Communication Communication: Impaired Factors Affecting Communication: Difficulty expressing self (difficulty with word finding)   Cognition Arousal: Alert Behavior During Therapy: WFL for tasks assessed/performed Cognition: No apparent impairments             OT - Cognition Comments: will further assess; good awareness into speech deficits and states she is a "little slower" regarding her processing                 Following commands: Intact       Cueing  General Comments   Cueing Techniques: Verbal cues  VSS on RA   Exercises     Shoulder Instructions      Home Living Family/patient expects to be discharged to:: Private residence Living Arrangements: Spouse/significant other Available Help at Discharge: Family;Available 24 hours/day Type of Home: House Home Access: Level entry     Home Layout: One level     Bathroom Shower/Tub: Chief Strategy Officer: Standard Bathroom Accessibility: Yes How Accessible: Accessible via walker Home Equipment: Rollator (4 wheels);Tub bench;Rolling Walker (2 wheels);Lift chair;Other (comment) (life alert)      Lives With: Spouse    Prior Functioning/Environment Prior Level of Function : Independent/Modified Independent;Needs assist             Mobility  Comments: Pt denies falls in the past 6 months, uses rollator at all times ADLs Comments: mod I with all ADL"s with the exception of socks/shoes, which her husband helps her with when needed; limited at baseline due to her "back";    OT Problem List: Decreased strength;Decreased activity tolerance;Impaired balance (sitting and/or standing);Decreased coordination;Decreased knowledge of use of DME or AE;Obesity   OT Treatment/Interventions: Self-care/ADL training;Therapeutic exercise;DME and/or AE instruction;Therapeutic activities;Patient/family education;Balance training      OT Goals(Current goals can be found in the care plan section)   Acute Rehab OT Goals Patient Stated Goal: to get better and go home OT Goal Formulation: With patient Time For Goal Achievement: 12/30/23 Potential to Achieve Goals: Good   OT Frequency:  Min 2X/week    Co-evaluation              AM-PAC OT "6 Clicks" Daily Activity     Outcome Measure Help from another person eating meals?: None Help from another person taking care of personal grooming?: A Little Help from another person toileting, which includes using toliet, bedpan, or urinal?: A Little Help from another person bathing (including washing, rinsing, drying)?: A Little Help from another person to put on and taking off regular upper body clothing?: A Little Help from another person to put on and taking off regular lower body clothing?:  A Little 6 Click Score: 19   End of Session Equipment Utilized During Treatment: Gait belt;Rolling walker (2 wheels) Nurse Communication: Mobility status;Other (comment) (Pt complaining of buring when urinating)  Activity Tolerance: Patient tolerated treatment well Patient left: in chair;with call bell/phone within reach;with chair alarm set  OT Visit Diagnosis: Unsteadiness on feet (R26.81);Muscle weakness (generalized) (M62.81);Cognitive communication deficit (R41.841) Symptoms and signs involving  cognitive functions: Cerebral infarction                Time: 1610-9604 OT Time Calculation (min): 32 min Charges:  OT General Charges $OT Visit: 1 Visit OT Evaluation $OT Eval Moderate Complexity: 1 Mod OT Treatments $Self Care/Home Management : 8-22 mins  Luisa Dago, OT/L   Acute OT Clinical Specialist Acute Rehabilitation Services Pager [phone] Office [phone]   [facility] 12/16/2023, 12:54 PM

## 2023-12-16 NOTE — Plan of Care (Signed)
  Problem: Self-Care: Goal: Ability to participate in self-care as condition permits will improve Outcome: Progressing Goal: Verbalization of feelings and concerns over difficulty with self-care will improve Outcome: Progressing Goal: Ability to communicate needs accurately will improve Outcome: Progressing   Problem: Clinical Measurements: Goal: Ability to maintain clinical measurements within normal limits will improve Outcome: Progressing   Problem: Skin Integrity: Goal: Risk for impaired skin integrity will decrease Outcome: Progressing   Problem: Safety: Goal: Ability to remain free from injury will improve Outcome: Progressing   Problem: Activity: Goal: Ability to return to baseline activity level will improve Outcome: Progressing

## 2023-12-17 DIAGNOSIS — I63512 Cerebral infarction due to unspecified occlusion or stenosis of left middle cerebral artery: Secondary | ICD-10-CM | POA: Diagnosis not present

## 2023-12-17 LAB — BASIC METABOLIC PANEL
Anion gap: 10 (ref 5–15)
BUN: 16 mg/dL (ref 8–23)
CO2: 20 mmol/L — ABNORMAL LOW (ref 22–32)
Calcium: 8.7 mg/dL — ABNORMAL LOW (ref 8.9–10.3)
Chloride: 105 mmol/L (ref 98–111)
Creatinine, Ser: 0.81 mg/dL (ref 0.44–1.00)
GFR, Estimated: >60 mL/min (ref >60)
Glucose, Bld: 118 mg/dL — ABNORMAL HIGH (ref 70–99)
Potassium: 3.9 mmol/L (ref 3.5–5.1)
Sodium: 135 mmol/L (ref 135–145)

## 2023-12-17 LAB — CBC
HCT: 29.6 % — ABNORMAL LOW (ref 36.0–46.0)
Hemoglobin: 10 g/dL — ABNORMAL LOW (ref 12.0–15.0)
MCH: 30.2 pg (ref 26.0–34.0)
MCHC: 33.8 g/dL (ref 30.0–36.0)
MCV: 89.4 fL (ref 80.0–100.0)
Platelets: 173 10*3/uL (ref 150–400)
RBC: 3.31 MIL/uL — ABNORMAL LOW (ref 3.87–5.11)
RDW: 13.3 % (ref 11.5–15.5)
WBC: 8.1 10*3/uL (ref 4.0–10.5)
nRBC: 0 % (ref 0.0–0.2)

## 2023-12-17 MED ORDER — NITROFURANTOIN MONOHYD MACRO 100 MG PO CAPS
100.0000 mg | ORAL_CAPSULE | Freq: Two times a day (BID) | ORAL | Status: DC
Start: 2023-12-17 — End: 2023-12-22
  Administered 2023-12-17 – 2023-12-18 (×2): 100 mg via ORAL
  Filled 2023-12-17 (×3): qty 1

## 2023-12-17 NOTE — Progress Notes (Addendum)
 STROKE TEAM PROGRESS NOTE    SIGNIFICANT HOSPITAL EVENTS 3/20 received IV TNK for acute onset of right-sided weakness and aphasia CTA with left M1 occlusion at the bifurcation and a concomitant left M2 branch occlusion.  Went to IR  INTERIM HISTORY/SUBJECTIVE No family at the bedside.  No new neurological events overnight.   Neurological exam is improving, speech is improved still has some expressive aphasia but better than yesterday.   Waiting on CIR bed availability. She is medically stable for transfer to rehab   OBJECTIVE  CBC    Component Value Date/Time   WBC 8.1 12/17/2023 0747   RBC 3.31 (L) 12/17/2023 0747   HGB 10.0 (L) 12/17/2023 0747   HCT 29.6 (L) 12/17/2023 0747   PLT 173 12/17/2023 0747   MCV 89.4 12/17/2023 0747   MCH 30.2 12/17/2023 0747   MCHC 33.8 12/17/2023 0747   RDW 13.3 12/17/2023 0747   LYMPHSABS 0.7 12/14/2023 0301   MONOABS 0.6 12/14/2023 0301   EOSABS 0.0 12/14/2023 0301   BASOSABS 0.0 12/14/2023 0301    BMET    Component Value Date/Time   NA 135 12/17/2023 0747   K 3.9 12/17/2023 0747   CL 105 12/17/2023 0747   CO2 20 (L) 12/17/2023 0747   GLUCOSE 118 (H) 12/17/2023 0747   BUN 16 12/17/2023 0747   CREATININE 0.81 12/17/2023 0747   CALCIUM 8.7 (L) 12/17/2023 0747   GFRNONAA >60 12/17/2023 0747    IMAGING past 24 hours No results found.   Vitals:   12/17/23 0024 12/17/23 0444 12/17/23 0717 12/17/23 1233  BP: (!) 140/54 (!) 124/52 (!) 126/55 131/62  Pulse: 65 70 (!) 58 65  Resp: 18 18 18 18   Temp: 98.8 F (37.1 C) 98.3 F (36.8 C) 98.7 F (37.1 C) 98.9 F (37.2 C)  TempSrc: Oral Oral Oral Oral  SpO2: 95% 94% 96% 97%  Weight:      Height:         PHYSICAL EXAM General: Critically ill elderly female Psych:  Mood and affect appropriate for situation CV: Regular rate and rhythm on monitor Respiratory:  Regular, unlabored respirations on cannula GI: Abdomen soft and nontender   NEURO:  Mental Status: She is awake alert  and oriented self, expressive aphasia with mild dysarthria, she was able to state her name, still having word finding difficulties  Cranial Nerves:  II: PERRL. Visual fields full.  III, IV, VI: EOMI. Eyelids elevate symmetrically.  V: Sensation is intact to light touch and symmetrical to face.  VII: Right facial VIII: hearing intact to voice. IX, X: Palate elevates symmetrically. Phonation is normal.  ZO:[id number] shrug 5/5. XII: tongue is midline without fasciculations. Motor: right arm 4/5, right leg with mild drift 4/5, left arm and leg 5/5, Tone: is normal and bulk is normal Sensation- Intact to light touch bilaterally. Extinction absent to light touch to DSS.   Coordination: FTN intact bilaterally, HKS: no ataxia in BLE.No drift.  Gait- deferred  Most Recent NIH 4   ASSESSMENT/PLAN  [name] is a 88 y.o. female with history of diabetes, chronic pain, fibromyalgia, prior history of TIA without residual deficits, headache, hypertension, peripheral arterial disease, hyperlipidemia, thyroid disease, chronic pain sleep apnea, history of a VP shunt, recent admission at [facility] hospital for STEMI with cardiac catheterization/PCI-presented to the emergency department for evaluation of right-sided weakness and aphasia as a code stroke.  NIH 20  Stroke:  left MCA scattered infarcts and right MCA punctate infarct  with left M1/M2 occlusion s/p IV TNK and IR with TICI 3, etiology: Likely cardioembolic source with recent STEMI vs. Occult afib Code Stroke CT head No acute abnormality. Small vessel disease. Atrophy. ASPECTS 10.    CTA head & neck occlusion of the distal left M1 segment at the level of the left MCA bifurcation. Additional downstream proximal left M2 occlusion Status post IR with left M1/M2 occlusion and TICI3 Post IR CT no hemorrhage MRI left MCA scattered infarct with right MCA punctate infarct MRA left MCA now patent 2D Echo EF 70 to 75%.  Mild LVH  with grade 1 diastolic dysfunction LE venous Doppler negative Consider loop recorder prior to discharge LDL 112 HgbA1c 6.6 VTE prophylaxis -Lovenox No antithrombotic (aspirin and Brilinta post discharge on 3/20 however patient was not taking) prior to admission, now on ASA and brilinta given  stent Therapy recommendations:  CIR Disposition: Pending  CAD STEMI status post cardiac stent recently discharged from [facility] after getting a cardiac catheterization for STEMI Discharged home 3/20 and on DAPT.  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 Agree with note above.  Neurological exam is improving but not back to baseline.  Patient is medically stable to be transferred to inpatient rehab when bed available.  No family at the bedside.  Delia Heady, MD Medical Director [facility] Pager: [phone] 12/17/2023 4:26 PM    To contact Stroke Continuity provider, please refer to [email or web address]. After hours, contact  General Neurology

## 2023-12-17 NOTE — Progress Notes (Signed)
 Occupational Therapy Treatment Patient Details Name: Brittany Atkins MRN: [id number] DOB: [birth year] Today's Date: 12/17/2023   History of present illness Pt is 88 yo who presented to [facility] ED on 3/20 after being discharged earlier in the day after cardiac cath from [facility]. Pt went into the bathroom and started to experience slurred speech, R sided facial droop and R sided weakness. MRI (+) left MCA CVA; subcentimeter acute ischemic nonhemorrhagic subcortical right frontal lobe. TNK 3/20 and sent to IR for re-vascularization. PMH: VP shunt, DM II, CAD, STEMI and stent placement.   OT comments  Brittany Atkins is making excellent progress. Able to complete entire ADL task @ sit - stand level using RW for support with S to CGA. Given significant improvement feel pt can DC home with increased amount of PCA hours from PACE and HHOT. Pt complaining of pain during urination - nsg made aware. Acute OT to continue to follow to facilitate safe DC home. Encourage short walks with staff.       If plan is discharge home, recommend the following:  A little help with walking and/or transfers;A little help with bathing/dressing/bathroom;Assistance with cooking/housework;Assist for transportation   Equipment Recommendations  None recommended by OT    Recommendations for Other Services      Precautions / Restrictions Precautions Precautions: Fall Recall of Precautions/Restrictions: Intact Restrictions Weight Bearing Restrictions Per Provider Order: No       Mobility Bed Mobility Overal bed mobility: Modified Independent                  Transfers Overall transfer level: Needs assistance Equipment used: Rolling walker (2 wheels) Transfers: Sit to/from Stand, Bed to chair/wheelchair/BSC Sit to Stand: Supervision                 Balance Overall balance assessment: Needs assistance Sitting-balance support: No upper extremity supported, Feet supported Sitting  balance-Leahy Scale: Good     Standing balance support: Bilateral upper extremity supported, Reliant on assistive device for balance Standing balance-Leahy Scale: Poor Standing balance comment: using a rollator at baseline                           ADL either performed or assessed with clinical judgement   ADL Overall ADL's : Needs assistance/impaired Eating/Feeding: Modified independent   Grooming: Set up;Sitting   Upper Body Bathing: Set up;Sitting   Lower Body Bathing: Minimal assistance;Sit to/from stand   Upper Body Dressing : Set up;Standing   Lower Body Dressing: Minimal assistance;Sit to/from stand (for socks, which is her baseline)   Toilet Transfer: Contact guard assist;Ambulation;Rolling walker (2 wheels)   Toileting- Clothing Manipulation and Hygiene: Sit to/from stand;Supervision/safety       Functional mobility during ADLs: Contact guard assist;Rolling walker (2 wheels);Cueing for safety General ADL Comments: Stood at sink to wash hair then completed an entire ADL task @ sit - stand level with S/CGA    Extremity/Trunk Assessment Upper Extremity Assessment LUE Deficits / Details: AROM/Strength overall WFL however mildingweaker on the R; mild incoordiantion problems however using functionally without difficulty   Lower Extremity Assessment Lower Extremity Assessment: Defer to PT evaluation        Vision   Vision Assessment?: Yes Eye Alignment: Within Functional Limits Ocular Range of Motion: Within Functional Limits Alignment/Gaze Preference: Within Defined Limits Tracking/Visual Pursuits: Able to track stimulus in all quads without difficulty Saccades: Within functional limits Convergence: Within functional limits Visual Fields: No apparent  deficits   Perception     Praxis     Communication Communication Communication: Impaired Factors Affecting Communication: Difficulty expressing self (difficulty with word finding)   Cognition  Arousal: Alert Behavior During Therapy: WFL for tasks assessed/performed Cognition: Cognition impaired     Awareness: Online awareness intact Memory impairment (select all impairments): Declarative long-term memory, Working memory     OT - Cognition Comments: Pt demonstrates increased difficuty with memory and is aware of this deficit                 Following commands: Intact        Cueing   Cueing Techniques: Verbal cues  Exercises Exercises: Other exercises    Shoulder Instructions       General Comments      Pertinent Vitals/ Pain          Home Living                                          Prior Functioning/Environment              Frequency  Min 2X/week        Progress Toward Goals  OT Goals(current goals can now be found in the care plan section)  Progress towards OT goals: Progressing toward goals  Acute Rehab OT Goals Patient Stated Goal: get better and go home OT Goal Formulation: With patient Time For Goal Achievement: 12/30/23 Potential to Achieve Goals: Good ADL Goals Pt Will Perform Lower Body Bathing: with modified independence;sit to/from stand Pt Will Perform Lower Body Dressing: with modified independence;sit to/from stand;with adaptive equipment Pt Will Transfer to Toilet: with modified independence;ambulating Pt Will Perform Toileting - Clothing Manipulation and hygiene: with modified independence;sit to/from stand;sitting/lateral leans Pt/caregiver will Perform Home Exercise Program: Right Upper extremity;With written HEP provided;Independently;With theraputty  Plan      Co-evaluation                 AM-PAC OT "6 Clicks" Daily Activity     Outcome Measure   Help from another person eating meals?: None Help from another person taking care of personal grooming?: A Little Help from another person toileting, which includes using toliet, bedpan, or urinal?: A Little Help from another person  bathing (including washing, rinsing, drying)?: A Little Help from another person to put on and taking off regular upper body clothing?: A Little Help from another person to put on and taking off regular lower body clothing?: A Little 6 Click Score: 19    End of Session Equipment Utilized During Treatment: Gait belt;Rolling walker (2 wheels)  OT Visit Diagnosis: Unsteadiness on feet (R26.81);Muscle weakness (generalized) (M62.81);Cognitive communication deficit (R41.841) Symptoms and signs involving cognitive functions: Cerebral infarction   Activity Tolerance Patient tolerated treatment well   Patient Left in bed;with call bell/phone within reach;with bed alarm set   Nurse Communication Mobility status;Other (comment) (Pt complaining of buring when urinating)        Time: 4098-1191 OT Time Calculation (min): 33 min  Charges: OT General Charges $OT Visit: 1 Visit OT Treatments $Self Care/Home Management : 23-37 mins  Luisa Dago, OT/L   Acute OT Clinical Specialist Acute Rehabilitation Services Pager [phone] Office [phone]   [facility] 12/17/2023, 12:50 PM

## 2023-12-17 NOTE — Care Management Important Message (Signed)
 Important Message  Patient Details  Name: Brittany Atkins MRN: [id number] Date of Birth: May 27, 1936   Important Message Given:  Yes - Medicare IM     Dorena Bodo 12/17/2023, 2:24 PM

## 2023-12-17 NOTE — Progress Notes (Signed)
 PT Cancellation Note  Patient Details Name: Brittany Atkins MRN: [id number] DOB: 02/10/1936   Cancelled Treatment:    Reason Eval/Treat Not Completed: Other (comment) (Pt currently working with OT. Politely requesting rest break prior to PT session. Will follow up later this afternoon if time allows.)   Gladys Damme 12/17/2023, 11:28 AM

## 2023-12-17 NOTE — Progress Notes (Addendum)
 Inpatient Rehab Admissions Coordinator:  Insurance authorization started. Will continue to follow.  ADDENDUM: Note therapy changed recommendations to [facility]. TOC made aware. Will withdraw insurance authorization request.   Wolfgang Phoenix, MS, CCC-SLP Admissions Coordinator [phone]

## 2023-12-17 NOTE — Progress Notes (Signed)
 Physical Therapy Treatment Patient Details Name: Brittany Atkins MRN: [id number] DOB: 1936/06/04 Today's Date: 12/17/2023   History of Present Illness Pt is 88 yo who presented to [facility] ED on 3/20 after being discharged earlier in the day after cardiac cath from [facility]. Pt went into the bathroom and started to experience slurred speech, R sided facial droop and R sided weakness. MRI (+) left MCA CVA; subcentimeter acute ischemic nonhemorrhagic subcortical right frontal lobe. TNK 3/20 and sent to IR for re-vascularization. PMH: VP shunt, DM II, CAD, STEMI and stent placement.    PT Comments  Pt tolerated treatment well today. Pt today was able to ambulate in hallway with rollator at supervision level. DC recs updated to HHPT with increased PACE hours. PT will continue to follow.     If plan is discharge home, recommend the following: A lot of help with walking and/or transfers;Assistance with cooking/housework;Assist for transportation   Can travel by private vehicle        Equipment Recommendations  BSC/3in1    Recommendations for Other Services       Precautions / Restrictions Precautions Precautions: Fall Recall of Precautions/Restrictions: Intact Restrictions Weight Bearing Restrictions Per Provider Order: No     Mobility  Bed Mobility Overal bed mobility: Modified Independent                  Transfers Overall transfer level: Needs assistance Equipment used: Rollator (4 wheels) Transfers: Sit to/from Stand, Bed to chair/wheelchair/BSC Sit to Stand: Supervision           General transfer comment: Cues for locking brakes.    Ambulation/Gait Ambulation/Gait assistance: Supervision Gait Distance (Feet): 200 Feet Assistive device: Rollator (4 wheels) Gait Pattern/deviations: Trunk flexed, Decreased stride length, Step-through pattern Gait velocity: decreased     General Gait Details: no LOB noted. Pt able to hold conversation while  ambulating without getting winded.   Stairs             Wheelchair Mobility     Tilt Bed    Modified Rankin (Stroke Patients Only) Modified Rankin (Stroke Patients Only) Pre-Morbid Rankin Score: No symptoms Modified Rankin: Moderate disability     Balance Overall balance assessment: Needs assistance Sitting-balance support: No upper extremity supported, Feet supported Sitting balance-Leahy Scale: Good     Standing balance support: Bilateral upper extremity supported, Reliant on assistive device for balance Standing balance-Leahy Scale: Poor Standing balance comment: using a rollator at baseline                            Communication Communication Communication: Impaired Factors Affecting Communication: Difficulty expressing self (difficulty with word finding)  Cognition Arousal: Alert Behavior During Therapy: WFL for tasks assessed/performed                             Following commands: Intact      Cueing Cueing Techniques: Verbal cues  Exercises      General Comments        Pertinent Vitals/Pain      Home Living   Living Arrangements: Spouse/significant other Available Help at Discharge: Family;Available 24 hours/day (daughter can provide intermittent assistance) Type of Home: Apartment Home Access: Level entry       Home Layout: One level        Prior Function            PT Goals (  current goals can now be found in the care plan section) Progress towards PT goals: Progressing toward goals    Frequency    Min 2X/week      PT Plan      Co-evaluation              AM-PAC PT "6 Clicks" Mobility   Outcome Measure  Help needed turning from your back to your side while in a flat bed without using bedrails?: A Little Help needed moving from lying on your back to sitting on the side of a flat bed without using bedrails?: A Little Help needed moving to and from a bed to a chair (including a  wheelchair)?: A Lot Help needed standing up from a chair using your arms (e.g., wheelchair or bedside chair)?: A Little Help needed to walk in hospital room?: A Little Help needed climbing 3-5 steps with a railing? : A Lot 6 Click Score: 16    End of Session Equipment Utilized During Treatment: Gait belt Activity Tolerance: Patient tolerated treatment well Patient left: in bed;with call bell/phone within reach;with nursing/sitter in room Nurse Communication: Mobility status PT Visit Diagnosis: Other abnormalities of gait and mobility (R26.89)     Time: 5621-3086 PT Time Calculation (min) (ACUTE ONLY): 18 min  Charges:    $Gait Training: 8-22 mins PT General Charges $$ ACUTE PT VISIT: 1 Visit                     Shela Nevin, PT, DPT Acute Rehab Services [phone]    Gladys Damme 12/17/2023, 3:30 PM

## 2023-12-17 NOTE — Progress Notes (Signed)
 Speech Language Pathology Treatment:    Patient Details Name: Brittany Atkins MRN: [id number] DOB: October 14, 1935 Today's Date: 12/17/2023 Time: 0945-1000 SLP Time Calculation (min) (ACUTE ONLY): 15 min  Assessment / Plan / Recommendation Clinical Impression  Pt demonstrates ongoing apraxia, any language comprehension or expression impairment has resolved. Pt does not remember most of out prior session. She has been reading her Psalms aloud to practice fluency and articulation. Pt had mild errors consisting mostly of inconsistent distortions and additions. Pt was able to read 15 single syllable words with 100% accuracy. Pt had no accuracy with unfamiliar multisyllabic words. SLP provided model which improved accuracy somewhat. Best cue was for pt to use the word in a meaningful phrase. This strategy increased articulation accuracy significantly. Practiced reciting the Lords Prayer, singing [employer] and  reciting and reading The Pledge of allegiance. Pt does best with reading and spontaneous language than reciting or repeating. Provided education about apraxia to pt to share with family and also several word lists to practice. Pt is motivated and rapidly improving.   HPI HPI: Brittany Atkins is a 88 y.o. female presented to the emergency department for evaluation of right-sided weakness and aphasia as a code stroke.  Patient was recently discharged from [facility] hospital after getting a cardiac catheterization for STEMI, and got home  and was supposed to be on DAPT.  Did not take any of the medicines at home yet.Marland Kitchen  She was on the toilet when all of a sudden family noted that she is having right-sided weakness and not able to talk.  EMS was called-they brought the patient in for emergent evaluation with an LVO score in the field of 5.  Noncontrasted head CT unremarkable  TNK administered.  Pt with hx of diabetes, chronic pain, fibromyalgia, prior history of TIA without  residual deficits, headache, hypertension, peripheral arterial disease, sleep apnea, history of a VP shunt-      SLP Plan  Continue with current plan of care      Recommendations for follow up therapy are one component of a multi-disciplinary discharge planning process, led by the attending physician.  Recommendations may be updated based on patient status, additional functional criteria and insurance authorization.    Recommendations                                 Continue with current plan of care     Darwin Guastella, Riley Nearing  12/17/2023, 11:00 AM

## 2023-12-17 NOTE — Plan of Care (Signed)
  Problem: Education: Goal: Knowledge of disease or condition will improve Outcome: Progressing Goal: Knowledge of secondary prevention will improve (MUST DOCUMENT ALL) Outcome: Progressing Goal: Knowledge of patient specific risk factors will improve (DELETE if not current risk factor) Outcome: Progressing   Problem: Ischemic Stroke/TIA Tissue Perfusion: Goal: Complications of ischemic stroke/TIA will be minimized Outcome: Progressing   Problem: Coping: Goal: Will verbalize positive feelings about self Outcome: Progressing Goal: Will identify appropriate support needs Outcome: Progressing   Problem: Health Behavior/Discharge Planning: Goal: Ability to manage health-related needs will improve Outcome: Progressing Goal: Goals will be collaboratively established with patient/family Outcome: Progressing   Problem: Self-Care: Goal: Ability to participate in self-care as condition permits will improve Outcome: Progressing Goal: Verbalization of feelings and concerns over difficulty with self-care will improve Outcome: Progressing Goal: Ability to communicate needs accurately will improve Outcome: Progressing   Problem: Nutrition: Goal: Risk of aspiration will decrease Outcome: Progressing Goal: Dietary intake will improve Outcome: Progressing   Problem: Education: Goal: Knowledge of General Education information will improve Description: Including pain rating scale, medication(s)/side effects and non-pharmacologic comfort measures Outcome: Progressing   Problem: Health Behavior/Discharge Planning: Goal: Ability to manage health-related needs will improve Outcome: Progressing   Problem: Clinical Measurements: Goal: Ability to maintain clinical measurements within normal limits will improve Outcome: Progressing Goal: Will remain free from infection Outcome: Progressing Goal: Diagnostic test results will improve Outcome: Progressing Goal: Respiratory complications will  improve Outcome: Progressing Goal: Cardiovascular complication will be avoided Outcome: Progressing   Problem: Activity: Goal: Risk for activity intolerance will decrease Outcome: Progressing   Problem: Nutrition: Goal: Adequate nutrition will be maintained Outcome: Progressing   Problem: Coping: Goal: Level of anxiety will decrease Outcome: Progressing   Problem: Elimination: Goal: Will not experience complications related to bowel motility Outcome: Progressing Goal: Will not experience complications related to urinary retention Outcome: Progressing   Problem: Pain Managment: Goal: General experience of comfort will improve and/or be controlled Outcome: Progressing   Problem: Safety: Goal: Ability to remain free from injury will improve Outcome: Progressing   Problem: Skin Integrity: Goal: Risk for impaired skin integrity will decrease Outcome: Progressing   Problem: Education: Goal: Understanding of CV disease, CV risk reduction, and recovery process will improve Outcome: Progressing Goal: Individualized Educational Video(s) Outcome: Progressing   Problem: Activity: Goal: Ability to return to baseline activity level will improve Outcome: Progressing   Problem: Cardiovascular: Goal: Ability to achieve and maintain adequate cardiovascular perfusion will improve Outcome: Progressing Goal: Vascular access site(s) Level 0-1 will be maintained Outcome: Progressing   Problem: Health Behavior/Discharge Planning: Goal: Ability to safely manage health-related needs after discharge will improve Outcome: Progressing

## 2023-12-18 ENCOUNTER — Other Ambulatory Visit: Payer: Self-pay | Admitting: Cardiology

## 2023-12-18 DIAGNOSIS — I63512 Cerebral infarction due to unspecified occlusion or stenosis of left middle cerebral artery: Secondary | ICD-10-CM | POA: Diagnosis not present

## 2023-12-18 LAB — CBC
HCT: 29.4 % — ABNORMAL LOW (ref 36.0–46.0)
Hemoglobin: 9.8 g/dL — ABNORMAL LOW (ref 12.0–15.0)
MCH: 29.8 pg (ref 26.0–34.0)
MCHC: 33.3 g/dL (ref 30.0–36.0)
MCV: 89.4 fL (ref 80.0–100.0)
Platelets: 184 10*3/uL (ref 150–400)
RBC: 3.29 MIL/uL — ABNORMAL LOW (ref 3.87–5.11)
RDW: 13.3 % (ref 11.5–15.5)
WBC: 7.5 10*3/uL (ref 4.0–10.5)
nRBC: 0 % (ref 0.0–0.2)

## 2023-12-18 LAB — BASIC METABOLIC PANEL
Anion gap: 11 (ref 5–15)
BUN: 16 mg/dL (ref 8–23)
CO2: 20 mmol/L — ABNORMAL LOW (ref 22–32)
Calcium: 8.9 mg/dL (ref 8.9–10.3)
Chloride: 105 mmol/L (ref 98–111)
Creatinine, Ser: 0.81 mg/dL (ref 0.44–1.00)
GFR, Estimated: >60 mL/min (ref >60)
Glucose, Bld: 109 mg/dL — ABNORMAL HIGH (ref 70–99)
Potassium: 3.6 mmol/L (ref 3.5–5.1)
Sodium: 136 mmol/L (ref 135–145)

## 2023-12-18 MED ORDER — ATORVASTATIN CALCIUM 40 MG PO TABS
40.0000 mg | ORAL_TABLET | Freq: Every day | ORAL | 0 refills | Status: AC
Start: 2023-12-18 — End: ?

## 2023-12-18 MED ORDER — ASPIRIN 81 MG PO TBEC
81.0000 mg | DELAYED_RELEASE_TABLET | Freq: Every day | ORAL | 12 refills | Status: AC
Start: 2023-12-19 — End: ?

## 2023-12-18 MED ORDER — NITROFURANTOIN MONOHYD MACRO 100 MG PO CAPS
100.0000 mg | ORAL_CAPSULE | Freq: Two times a day (BID) | ORAL | 0 refills | Status: AC
Start: 2023-12-18 — End: ?

## 2023-12-18 MED ORDER — TICAGRELOR 90 MG PO TABS
90.0000 mg | ORAL_TABLET | Freq: Two times a day (BID) | ORAL | 0 refills | Status: AC
Start: 2023-12-18 — End: ?

## 2023-12-18 NOTE — Plan of Care (Signed)
  Problem: Education: Goal: Knowledge of disease or condition will improve Outcome: Progressing   Problem: Education: Goal: Knowledge of secondary prevention will improve (MUST DOCUMENT ALL) Outcome: Progressing   Problem: Ischemic Stroke/TIA Tissue Perfusion: Goal: Complications of ischemic stroke/TIA will be minimized Outcome: Progressing   Problem: Self-Care: Goal: Ability to participate in self-care as condition permits will improve Outcome: Progressing   Problem: Health Behavior/Discharge Planning: Goal: Ability to manage health-related needs will improve Outcome: Progressing

## 2023-12-18 NOTE — Plan of Care (Signed)
 No acute changes. Ongoing care plan.   Problem: Ischemic Stroke/TIA Tissue Perfusion: Goal: Complications of ischemic stroke/TIA will be minimized Outcome: Progressing   Problem: Coping: Goal: Will verbalize positive feelings about self Outcome: Progressing Goal: Will identify appropriate support needs Outcome: Progressing   Problem: Education: Goal: Knowledge of General Education information will improve Description: Including pain rating scale, medication(s)/side effects and non-pharmacologic comfort measures Outcome: Progressing   Problem: Nutrition: Goal: Risk of aspiration will decrease Outcome: Progressing Goal: Dietary intake will improve Outcome: Progressing   Problem: Skin Integrity: Goal: Risk for impaired skin integrity will decrease Outcome: Progressing   Problem: Safety: Goal: Ability to remain free from injury will improve Outcome: Progressing   Problem: Pain Managment: Goal: General experience of comfort will improve and/or be controlled Outcome: Progressing

## 2023-12-18 NOTE — TOC Transition Note (Signed)
 Transition of Care [facility]) - Discharge Note   Patient Details  Name: Brittany Atkins MRN: [id number] Date of Birth: 05-24-1936  [facility] CM/SW Contact:  Kermit Balo, RN Phone Number: 12/18/2023, 3:15 PM   Clinical Narrative:     Pt has decided with her family to d/c home and have therapies through [facility]. CM has left voicemail for [place] [facility] updating her.  Pt has needed DME at home.  Pts family will transport her home.   Final next level of care: Home w Home Health Services Barriers to Discharge: No Barriers Identified   Patient Goals and CMS Choice   CMS Medicare.gov Compare Post Acute Care list provided to:: Patient Choice offered to / list presented to : Patient      Discharge Placement                       Discharge Plan and Services Additional resources added to the After Visit Summary for     Discharge Planning Services: CM Consult                      HH Arranged: PT, OT, Speech Therapy HH Agency: Other - See comment ([facility])        Social Drivers of Health (SDOH) Interventions SDOH Screenings   Food Insecurity: No Food Insecurity (12/14/2023)  Housing: Low Risk  (12/14/2023)  Transportation Needs: No Transportation Needs (12/14/2023)  Utilities: Not At Risk (12/14/2023)  Social Connections: Patient Unable To Answer (12/14/2023)  Tobacco Use: Low Risk  (12/11/2023)   Received from [facility]     Readmission Risk Interventions     No data to display

## 2023-12-18 NOTE — Discharge Summary (Addendum)
 Stroke Discharge Summary  Patient ID: Brittany Atkins   MRN: [id number]      DOB: 04/27/1936  Date of Admission: 12/13/2023 Date of Discharge: 12/18/2023  Attending Physician:  Stroke, Md, MD Patient's PCP:  Jethro Bastos, MD  DISCHARGE DIAGNOSIS: left MCA scattered infarcts and right MCA punctate infarct with left M1/M2 occlusion s/p IV TNK and mechanical thrombectomy with TICI excellent revascularization Principal Problem:   Acute ischemic left MCA stroke [facility]) Active Problems:   Middle cerebral artery embolism, left Expressive aphasia Recent STEMI Peripheral arterial disease Sleep apnea History of VP shunt   Allergies as of 12/18/2023       Reactions   Cefdinir    Unknown    Clindamycin/lincomycin    Unknown    Codeine Nausea Only   Cymbalta [duloxetine Hcl]    Unknown    Sulfamethizole    Unknown    Tizanidine    Patient states that she would fall asleep after taking this medication and wake up shortly after and not be able to go back to sleep   Lactose Other (See Comments)   Cramping and Gi intolerance    Oxycodone Rash        Medication List     STOP taking these medications    acyclovir 400 MG tablet Commonly known as: ZOVIRAX   aspirin 81 MG tablet Replaced by: aspirin EC 81 MG tablet   fluticasone 50 MCG/ACT nasal spray Commonly known as: FLONASE   ketotifen 0.025 % ophthalmic solution Commonly known as: ZADITOR   losartan-hydrochlorothiazide 100-25 MG tablet Commonly known as: HYZAAR   Pitavastatin Calcium 4 MG Tabs   traMADol 50 MG tablet Commonly known as: ULTRAM       TAKE these medications    ALPHA LIPOIC ACID PO Take 1 capsule by mouth in the morning and at bedtime.   Aspercreme Lidocaine 4 % Crea Generic drug: Lidocaine HCl Apply 1 application topically 3 (three) times daily as needed (pain).   aspirin EC 81 MG tablet Take 1 tablet (81 mg total) by mouth daily. Swallow whole. Start taking on: December 19, 2023 Replaces: aspirin 81 MG tablet   atorvastatin 40 MG tablet Commonly known as: LIPITOR Take 1 tablet (40 mg total) by mouth daily.   gabapentin 300 MG capsule Commonly known as: NEURONTIN Take 300 mg by mouth 2 (two) times daily.   magnesium oxide 400 (240 Mg) MG tablet Commonly known as: MAG-OX Take 400 mg by mouth daily.   nitrofurantoin (macrocrystal-monohydrate) 100 MG capsule Commonly known as: MACROBID Take 1 capsule (100 mg total) by mouth every 12 (twelve) hours.   nystatin powder Apply 1 application topically 3 (three) times daily as needed (skin irritation).   Omega 3 1000 MG Caps Take 1,000 mg by mouth once a week.   pantoprazole 20 MG tablet Commonly known as: PROTONIX Take 20 mg by mouth every evening.   Probiotic 250 MG Caps Take 250 mg by mouth in the morning and at bedtime.   ticagrelor 90 MG Tabs tablet Commonly known as: BRILINTA Take 1 tablet (90 mg total) by mouth 2 (two) times daily.   vitamin B-12 100 MCG tablet Commonly known as: CYANOCOBALAMIN Take 50 mcg by mouth once a week.   ZINC PO Take 1 tablet by mouth daily.        LABORATORY STUDIES CBC    Component Value Date/Time   WBC 7.5 12/18/2023 0518   RBC 3.29 (L) 12/18/2023 0518  HGB 9.8 (L) 12/18/2023 0518   HCT 29.4 (L) 12/18/2023 0518   PLT 184 12/18/2023 0518   MCV 89.4 12/18/2023 0518   MCH 29.8 12/18/2023 0518   MCHC 33.3 12/18/2023 0518   RDW 13.3 12/18/2023 0518   LYMPHSABS 0.7 12/14/2023 0301   MONOABS 0.6 12/14/2023 0301   EOSABS 0.0 12/14/2023 0301   BASOSABS 0.0 12/14/2023 0301   CMP    Component Value Date/Time   NA 136 12/18/2023 0518   K 3.6 12/18/2023 0518   CL 105 12/18/2023 0518   CO2 20 (L) 12/18/2023 0518   GLUCOSE 109 (H) 12/18/2023 0518   BUN 16 12/18/2023 0518   CREATININE 0.81 12/18/2023 0518   CALCIUM 8.9 12/18/2023 0518   PROT 6.3 (L) 12/13/2023 2009   ALBUMIN 3.4 (L) 12/13/2023 2009   AST 75 (H) 12/13/2023 2009   ALT 34 12/13/2023  2009   ALKPHOS 54 12/13/2023 2009   BILITOT 0.6 12/13/2023 2009   GFRNONAA >60 12/18/2023 0518   GFRAA >60 08/22/2019 1802   COAGS Lab Results  Component Value Date   INR 1.0 12/13/2023   INR 1.0 07/05/2020   Lipid Panel    Component Value Date/Time   CHOL 173 12/14/2023 0434   TRIG 134 12/14/2023 0434   HDL 34 (L) 12/14/2023 0434   CHOLHDL 5.1 12/14/2023 0434   VLDL 27 12/14/2023 0434   LDLCALC 112 (H) 12/14/2023 0434   HgbA1C  Lab Results  Component Value Date   HGBA1C 6.6 (H) 12/13/2023   Urinalysis    Component Value Date/Time   COLORURINE YELLOW 12/16/2023 1714   APPEARANCEUR CLEAR 12/16/2023 1714   LABSPEC <1.005 (L) 12/16/2023 1714   PHURINE 6.0 12/16/2023 1714   GLUCOSEU NEGATIVE 12/16/2023 1714   HGBUR MODERATE (A) 12/16/2023 1714   BILIRUBINUR NEGATIVE 12/16/2023 1714   KETONESUR NEGATIVE 12/16/2023 1714   PROTEINUR NEGATIVE 12/16/2023 1714   UROBILINOGEN 0.2 03/28/2011 2048   NITRITE NEGATIVE 12/16/2023 1714   LEUKOCYTESUR MODERATE (A) 12/16/2023 1714   Urine Drug Screen     Component Value Date/Time   LABOPIA NONE DETECTED 07/05/2020 1841   COCAINSCRNUR NONE DETECTED 07/05/2020 1841   LABBENZ NONE DETECTED 07/05/2020 1841   AMPHETMU NONE DETECTED 07/05/2020 1841   THCU NONE DETECTED 07/05/2020 1841   LABBARB POSITIVE (A) 07/05/2020 1841    Alcohol Level    Component Value Date/Time   ETH <10 12/13/2023 2009     SIGNIFICANT DIAGNOSTIC STUDIES IR PERCUTANEOUS ART THROMBECTOMY/INFUSION INTRACRANIAL INC DIAG ANGIO Result Date: 12/17/2023 INDICATION: New onset expressive aphasia and mild right-sided weakness. EXAM: 1. EMERGENT LARGE VESSEL OCCLUSION THROMBOLYSIS (anterior CIRCULATION) COMPARISON:  CT angiogram of the head and neck of 12/14/2023. MEDICATIONS: No antibiotic was administered within 1 hour of the procedure. ANESTHESIA/SEDATION: General anesthesia. CONTRAST:  Omnipaque 300 approximately 70 mL. FLUOROSCOPY TIME:  Fluoroscopy Time: 19  minutes 24 seconds (1087 mGy). COMPLICATIONS: None immediate. TECHNIQUE: Following a full explanation of the procedure along with the potential associated complications, an informed witnessed consent was obtained. The risks of intracranial hemorrhage of 10%, worsening neurological deficit, ventilator dependency, death and inability to revascularize were all reviewed in detail with the patient's daughter. The patient was then put under general anesthesia by the Department of Anesthesiology at [facility]. The right groin was prepped and draped in the usual sterile fashion. Thereafter using modified Seldinger technique, transfemoral access into the right common femoral artery was obtained without difficulty. Over an 0.035 inch guidewire an 8 French 25 cm  Pinnacle sheath was inserted. Through this, and also over an 0.035 inch guidewire a combination of a 100 cm 088 Zoom support catheter with a Berenstein 25 cm support catheter was advanced to the aortic arch region, and selectively positioned in the left common carotid artery. Arteriogram was then performed centered extra cranially and intracranially. FINDINGS: The left common carotid arteriogram demonstrates the left external carotid artery and its major branches to be widely patent. Left internal carotid artery at the bulb to the cranial skull base demonstrates wide patency with moderate tortuosity proximally. The petrous, the cavernous and the supraclinoid ICA demonstrate wide patency. Left posterior communicating artery is seen opacifying the left posterior cerebral artery distribution with moderate intracranial arteriosclerotic changes in the P1 segment. The left anterior cerebral artery opacifies into the capillary and venous phases. The left middle cerebral artery demonstrates pre occlusive thrombus in the proximal superior division and also with associated branch occlusion arising from this. The inferior division is widely patent. PROCEDURE: Through the  Zoom support catheter in the distal cavernous segment, a combination of an 062 aspiration catheter with an 021 160 cm microcatheter was advanced over an 0.018 inch Aristotle micro guidewire through the nearly occluded superior division into the M3 region followed by the microcatheter. The 062 was now engaged at the origin of the superior division. Micro guidewire was removed. Good aspiration obtained from the hub of the microcatheter. A gentle control arteriogram performed through this demonstrates safe positioning of the tip of the microcatheter which was connected to continuous heparinized saline infusion. A 4 mm x 40 mm Solitaire X retrieval device was advanced and positioned unsheathing the device in the distal M2 segment of the inferior division. Constant aspiration was applied at the hub of the 062 aspiration catheter with a pump for approximately 2 minutes. The retriever, the microcatheter, and the aspiration catheter were retrieved and removed. A control arteriogram performed through the Zoom support catheter demonstrated revascularization of the superior division and also a side branch arising from it slightly distally. A TICI 3 revascularization was achieved. Prominent spasm in the left M1 segment responded to 3 aliquots of 25 mcg of nitroglycerin intra-arterially. A final control arteriogram performed through the support sheath in the distal left internal carotid artery demonstrated improved patency and also relief of spasm in the M1 segment, and also the inferior division branches. A TICI 3 revascularization was maintained. Also noted was opacification of the left anterior cerebral artery into the capillary and venous phases with transient cross-filling via the anterior communicating artery of the right anterior cerebral artery distally. A final control arteriogram performed at the left common carotid bifurcation continued to demonstrate patency of the left internal carotid artery extra cranially and  intracranially. This was removed. An 8 French Angio-Seal closure device was deployed for hemostasis at the right groin puncture site. Distal pulses remained present bilaterally in both feet unchanged. A flat panel CT of the brain demonstrated no evidence of intracranial hemorrhage. Patient was then extubated. Upon recovery, the patient was able to follow simple commands though recovering slowly. She continued to have weakness in the right upper extremity. She was transferred to the neuro ICU for post revascularization care. IMPRESSION: Status post complete revascularization of the occluded superior division of the left middle cerebral artery with 1 pass with an 062 contact aspiration with a 4 mm x 40 mm Solitaire X retrieval device achieving a TICI 3 revascularization. PLAN: As per referring MD. Electronically Signed   By: Harlin Rain.D.  On: 12/17/2023 08:29   IR CT Head Ltd Result Date: 12/17/2023 INDICATION: New onset expressive aphasia and mild right-sided weakness. EXAM: 1. EMERGENT LARGE VESSEL OCCLUSION THROMBOLYSIS (anterior CIRCULATION) COMPARISON:  CT angiogram of the head and neck of 12/14/2023. MEDICATIONS: No antibiotic was administered within 1 hour of the procedure. ANESTHESIA/SEDATION: General anesthesia. CONTRAST:  Omnipaque 300 approximately 70 mL. FLUOROSCOPY TIME:  Fluoroscopy Time: 19 minutes 24 seconds (1087 mGy). COMPLICATIONS: None immediate. TECHNIQUE: Following a full explanation of the procedure along with the potential associated complications, an informed witnessed consent was obtained. The risks of intracranial hemorrhage of 10%, worsening neurological deficit, ventilator dependency, death and inability to revascularize were all reviewed in detail with the patient's daughter. The patient was then put under general anesthesia by the Department of Anesthesiology at [facility]. The right groin was prepped and draped in the usual sterile fashion. Thereafter using  modified Seldinger technique, transfemoral access into the right common femoral artery was obtained without difficulty. Over an 0.035 inch guidewire an 8 French 25 cm Pinnacle sheath was inserted. Through this, and also over an 0.035 inch guidewire a combination of a 100 cm 088 Zoom support catheter with a Berenstein 25 cm support catheter was advanced to the aortic arch region, and selectively positioned in the left common carotid artery. Arteriogram was then performed centered extra cranially and intracranially. FINDINGS: The left common carotid arteriogram demonstrates the left external carotid artery and its major branches to be widely patent. Left internal carotid artery at the bulb to the cranial skull base demonstrates wide patency with moderate tortuosity proximally. The petrous, the cavernous and the supraclinoid ICA demonstrate wide patency. Left posterior communicating artery is seen opacifying the left posterior cerebral artery distribution with moderate intracranial arteriosclerotic changes in the P1 segment. The left anterior cerebral artery opacifies into the capillary and venous phases. The left middle cerebral artery demonstrates pre occlusive thrombus in the proximal superior division and also with associated branch occlusion arising from this. The inferior division is widely patent. PROCEDURE: Through the Zoom support catheter in the distal cavernous segment, a combination of an 062 aspiration catheter with an 021 160 cm microcatheter was advanced over an 0.018 inch Aristotle micro guidewire through the nearly occluded superior division into the M3 region followed by the microcatheter. The 062 was now engaged at the origin of the superior division. Micro guidewire was removed. Good aspiration obtained from the hub of the microcatheter. A gentle control arteriogram performed through this demonstrates safe positioning of the tip of the microcatheter which was connected to continuous heparinized  saline infusion. A 4 mm x 40 mm Solitaire X retrieval device was advanced and positioned unsheathing the device in the distal M2 segment of the inferior division. Constant aspiration was applied at the hub of the 062 aspiration catheter with a pump for approximately 2 minutes. The retriever, the microcatheter, and the aspiration catheter were retrieved and removed. A control arteriogram performed through the Zoom support catheter demonstrated revascularization of the superior division and also a side branch arising from it slightly distally. A TICI 3 revascularization was achieved. Prominent spasm in the left M1 segment responded to 3 aliquots of 25 mcg of nitroglycerin intra-arterially. A final control arteriogram performed through the support sheath in the distal left internal carotid artery demonstrated improved patency and also relief of spasm in the M1 segment, and also the inferior division branches. A TICI 3 revascularization was maintained. Also noted was opacification of the left anterior cerebral artery into  the capillary and venous phases with transient cross-filling via the anterior communicating artery of the right anterior cerebral artery distally. A final control arteriogram performed at the left common carotid bifurcation continued to demonstrate patency of the left internal carotid artery extra cranially and intracranially. This was removed. An 8 French Angio-Seal closure device was deployed for hemostasis at the right groin puncture site. Distal pulses remained present bilaterally in both feet unchanged. A flat panel CT of the brain demonstrated no evidence of intracranial hemorrhage. Patient was then extubated. Upon recovery, the patient was able to follow simple commands though recovering slowly. She continued to have weakness in the right upper extremity. She was transferred to the neuro ICU for post revascularization care. IMPRESSION: Status post complete revascularization of the occluded  superior division of the left middle cerebral artery with 1 pass with an 062 contact aspiration with a 4 mm x 40 mm Solitaire X retrieval device achieving a TICI 3 revascularization. PLAN: As per referring MD. Electronically Signed   By: Julieanne Cotton M.D.   On: 12/17/2023 08:29   VAS [place] LOWER EXTREMITY VENOUS (DVT) Result Date: 12/16/2023  Lower Venous DVT Study Patient Name:  SIMA LINDENBERGER [facility]  Date of Exam:   12/15/2023 Medical Rec #: [id number]                 Accession #:    [id number] Date of Birth: 11/15/1935                  Patient Gender: F Patient Age:   83 years Exam Location:  [facility] Procedure:      VAS [place] LOWER EXTREMITY VENOUS (DVT) Referring Phys: Scheryl Marten XU --------------------------------------------------------------------------------  Indications: Stroke.  Limitations: Patient unable to tolerate compressions in left thigh and bandages in right groin from thrombectomy. Comparison Study: Prior negative left LEV done 11/13/23 Performing Technologist: Sherren Kerns RVS  Examination Guidelines: A complete evaluation includes B-mode imaging, spectral Doppler, color Doppler, and power Doppler as needed of all accessible portions of each vessel. Bilateral testing is considered an integral part of a complete examination. Limited examinations for reoccurring indications may be performed as noted. The reflux portion of the exam is performed with the patient in reverse Trendelenburg.  +---------+---------------+---------+-----------+---------------+--------------+ RIGHT    CompressibilityPhasicitySpontaneityProperties     Thrombus Aging +---------+---------------+---------+-----------+---------------+--------------+ CFV                                                        not visualized                                                            (bandage)      +---------+---------------+---------+-----------+---------------+--------------+ SFJ                                                         not visualized                                                            (  bandage)      +---------+---------------+---------+-----------+---------------+--------------+ FV Prox  Full           Yes      No         pulsatile                                                                 waveform                      +---------+---------------+---------+-----------+---------------+--------------+ FV Mid   Full                                                             +---------+---------------+---------+-----------+---------------+--------------+ FV DistalFull                                                             +---------+---------------+---------+-----------+---------------+--------------+ PFV      Full           Yes      No         pulsatile                                                                 waveform                      +---------+---------------+---------+-----------+---------------+--------------+ POP      Full           Yes      No         pulsatile                                                                 waveform                      +---------+---------------+---------+-----------+---------------+--------------+ PTV      Full                                                             +---------+---------------+---------+-----------+---------------+--------------+ PERO     Full                                                             +---------+---------------+---------+-----------+---------------+--------------+   +---------+---------------+---------+-----------+---------------+--------------+  LEFT     CompressibilityPhasicitySpontaneityProperties     Thrombus Aging +---------+---------------+---------+-----------+---------------+--------------+ CFV      Full           No       No         pulsatile                                                                  waveform                      +---------+---------------+---------+-----------+---------------+--------------+ SFJ      Full                                                             +---------+---------------+---------+-----------+---------------+--------------+ FV Prox  Full                                                             +---------+---------------+---------+-----------+---------------+--------------+ FV Mid                  Yes      No         pulsatile      patent by                                                  waveform       color and                                                                 Doppler        +---------+---------------+---------+-----------+---------------+--------------+ FV Distal               Yes      Yes        pulsatile      patent by                                                  waveform       color and  Doppler        +---------+---------------+---------+-----------+---------------+--------------+ PFV      Full                                                             +---------+---------------+---------+-----------+---------------+--------------+ POP      Full           Yes      Yes        pulsatile                                                                 waveform                      +---------+---------------+---------+-----------+---------------+--------------+ PTV      Full                                                             +---------+---------------+---------+-----------+---------------+--------------+ PERO     Full                                                             +---------+---------------+---------+-----------+---------------+--------------+     Summary: RIGHT: - There is no evidence of deep vein thrombosis in the lower extremity. However, portions of  this examination were limited- see technologist comments above.  - No cystic structure found in the popliteal fossa. Pulsatile waveforms  LEFT: - There is no evidence of deep vein thrombosis in the lower extremity.  - No cystic structure found in the popliteal fossa. Pulsatile waveforms.  *See table(s) above for measurements and observations. Electronically signed by Lemar Livings MD on 12/16/2023 at 5:42:09 PM.    Final    MR BRAIN WO CONTRAST Result Date: 12/14/2023 CLINICAL DATA:  Follow-up examination for stroke. EXAM: MRI HEAD WITHOUT CONTRAST MRA HEAD WITHOUT CONTRAST TECHNIQUE: Multiplanar, multi-echo pulse sequences of the brain and surrounding structures were acquired without intravenous contrast. Angiographic images of the Circle of Willis were acquired using MRA technique without intravenous contrast. COMPARISON:  Prior studies from 12/13/2023 FINDINGS: MRI HEAD FINDINGS Brain: Examination mildly limited by motion and susceptibility artifact from shunt reservoir or at the right occipital scalp. Cerebral volume within normal limits. Patchy T2/FLAIR hyperintensity involving the periventricular deep white matter both cerebral hemispheres, most characteristic of chronic microvascular ischemic disease, moderate in nature. Patchy restricted diffusion involving the left insula and overlying left frontal parietal and occipital lobes, consistent with an evolving acute left MCA distribution infarct (series 2, images 18-36). Additional subcentimeter acute ischemic infarct present at the subcortical right frontal lobe (series 2, image 38). No associated hemorrhage or mass effect. Gray-white matter differentiation otherwise maintained. No other acute or chronic intracranial blood products. Right  parietal approach shunt catheter in place with tip terminating in the left lateral ventricle. Stable ventricular size and morphology without hydrocephalus or transependymal flow of CSF. No mass lesion or midline shift. No  extra-axial fluid collection. Pituitary gland within normal limits. Vascular: Major intracranial vascular flow voids are maintained. Skull and upper cervical spine: Craniocervical junction within normal limits. Bone marrow signal intensity normal. No acute scalp soft tissue abnormality. Sinuses/Orbits: Prior bilateral ocular lens replacement. Paranasal sinuses are clear. No significant mastoid effusion. Other: None. MRA HEAD FINDINGS Anterior circulation: Visualized distal cervical segments of the internal carotid arteries are patent with antegrade flow. Mild atheromatous irregularity about the carotid siphons without stenosis or other abnormality. A1 segments patent bilaterally. Normal anterior communicating artery complex. Anterior cerebral arteries patent without stenosis. No M1 stenosis. There has been interval revascularization of previously seen distal left MCA occlusion. Left MCA branches are now patent and well perfused bilaterally. Posterior circulation: Both V4 segments patent without stenosis. Both PICA patent. Atheromatous irregularity about the proximal-mid basilar artery with associated moderate multifocal stenoses (series 1054, image 9). Superior cerebral arteries patent bilaterally. Right PCA supplied via the basilar. Fetal type origin left PCA. Focal moderate mid right P2 stenosis (series 1054, image 11). Left PCA patent without significant stenosis. Anatomic variants: As above.  No aneurysm. IMPRESSION: MRI HEAD: 1. Evolving acute left MCA distribution infarct. No associated hemorrhage or mass effect. 2. Additional subcentimeter acute ischemic nonhemorrhagic subcortical right frontal lobe infarct. 3. Underlying moderate chronic microvascular ischemic disease. 4. Right parietal approach shunt catheter in place with tip terminating in the left lateral ventricle. Stable ventricular size and morphology without hydrocephalus. MRA HEAD: 1. Interval revascularization of previously seen distal left MCA  occlusion. No residual or recurrent large vessel occlusion. 2. Intracranial atherosclerotic disease about the posterior circulation with moderate multifocal stenoses involving the basilar artery and right P2 segment. Electronically Signed   By: Rise Mu M.D.   On: 12/14/2023 21:49   MR ANGIO HEAD WO CONTRAST Result Date: 12/14/2023 CLINICAL DATA:  Follow-up examination for stroke. EXAM: MRI HEAD WITHOUT CONTRAST MRA HEAD WITHOUT CONTRAST TECHNIQUE: Multiplanar, multi-echo pulse sequences of the brain and surrounding structures were acquired without intravenous contrast. Angiographic images of the Circle of Willis were acquired using MRA technique without intravenous contrast. COMPARISON:  Prior studies from 12/13/2023 FINDINGS: MRI HEAD FINDINGS Brain: Examination mildly limited by motion and susceptibility artifact from shunt reservoir or at the right occipital scalp. Cerebral volume within normal limits. Patchy T2/FLAIR hyperintensity involving the periventricular deep white matter both cerebral hemispheres, most characteristic of chronic microvascular ischemic disease, moderate in nature. Patchy restricted diffusion involving the left insula and overlying left frontal parietal and occipital lobes, consistent with an evolving acute left MCA distribution infarct (series 2, images 18-36). Additional subcentimeter acute ischemic infarct present at the subcortical right frontal lobe (series 2, image 38). No associated hemorrhage or mass effect. Gray-white matter differentiation otherwise maintained. No other acute or chronic intracranial blood products. Right parietal approach shunt catheter in place with tip terminating in the left lateral ventricle. Stable ventricular size and morphology without hydrocephalus or transependymal flow of CSF. No mass lesion or midline shift. No extra-axial fluid collection. Pituitary gland within normal limits. Vascular: Major intracranial vascular flow voids are  maintained. Skull and upper cervical spine: Craniocervical junction within normal limits. Bone marrow signal intensity normal. No acute scalp soft tissue abnormality. Sinuses/Orbits: Prior bilateral ocular lens replacement. Paranasal sinuses are clear. No significant mastoid effusion. Other: None. MRA HEAD  FINDINGS Anterior circulation: Visualized distal cervical segments of the internal carotid arteries are patent with antegrade flow. Mild atheromatous irregularity about the carotid siphons without stenosis or other abnormality. A1 segments patent bilaterally. Normal anterior communicating artery complex. Anterior cerebral arteries patent without stenosis. No M1 stenosis. There has been interval revascularization of previously seen distal left MCA occlusion. Left MCA branches are now patent and well perfused bilaterally. Posterior circulation: Both V4 segments patent without stenosis. Both PICA patent. Atheromatous irregularity about the proximal-mid basilar artery with associated moderate multifocal stenoses (series 1054, image 9). Superior cerebral arteries patent bilaterally. Right PCA supplied via the basilar. Fetal type origin left PCA. Focal moderate mid right P2 stenosis (series 1054, image 11). Left PCA patent without significant stenosis. Anatomic variants: As above.  No aneurysm. IMPRESSION: MRI HEAD: 1. Evolving acute left MCA distribution infarct. No associated hemorrhage or mass effect. 2. Additional subcentimeter acute ischemic nonhemorrhagic subcortical right frontal lobe infarct. 3. Underlying moderate chronic microvascular ischemic disease. 4. Right parietal approach shunt catheter in place with tip terminating in the left lateral ventricle. Stable ventricular size and morphology without hydrocephalus. MRA HEAD: 1. Interval revascularization of previously seen distal left MCA occlusion. No residual or recurrent large vessel occlusion. 2. Intracranial atherosclerotic disease about the posterior  circulation with moderate multifocal stenoses involving the basilar artery and right P2 segment. Electronically Signed   By: Rise Mu M.D.   On: 12/14/2023 21:49   ECHOCARDIOGRAM COMPLETE Result Date: 12/14/2023    ECHOCARDIOGRAM REPORT   Patient Name:   NILZA EAKER [facility] Date of Exam: 12/14/2023 Medical Rec #:  [id number]                Height:       62.0 in Accession #:    [id number]               Weight:       173.7 lb Date of Birth:  [birth year]                 BSA:          1.801 m Patient Age:    87 years                 BP:           117/49 mmHg Patient Gender: F                        HR:           61 bpm. Exam Location:  Inpatient Procedure: 2D Echo (Both Spectral and Color Flow Doppler were utilized during            procedure). Indications:    stroke  History:        Patient has prior history of Echocardiogram examinations, most                 recent 07/06/2020. Risk Factors:Hypertension, Dyslipidemia,                 Diabetes and Sleep Apnea.  Sonographer:    Delcie Roch RDCS Referring Phys: [id number] ASHISH ARORA IMPRESSIONS  1. Left ventricular ejection fraction, by estimation, is 70 to 75%. Left ventricular ejection fraction by PLAX is 74 %. The left ventricle has hyperdynamic function. The left ventricle has no regional wall motion abnormalities. There is mild left ventricular hypertrophy. Left ventricular diastolic parameters are consistent with Grade I diastolic dysfunction (impaired relaxation).  2. Right  ventricular systolic function is normal. The right ventricular size is normal. There is mildly elevated pulmonary artery systolic pressure. The estimated right ventricular systolic pressure is 38.5 mmHg.  3. The mitral valve is abnormal. Trivial mitral valve regurgitation.  4. The aortic valve is tricuspid. Aortic valve regurgitation is not visualized. Mild aortic valve stenosis. Aortic valve area, by VTI measures 1.77 cm. Aortic valve mean gradient measures 12.0 mmHg.  Aortic valve Vmax measures 2.39 m/s. Peak gradient 22.8 mmHg, DI 0.56.  5. The inferior vena cava is dilated in size with >50% respiratory variability, suggesting right atrial pressure of 8 mmHg. Comparison(s): Changes from prior study are noted. 07/06/2020: LVEF 60-65%, aortic sclerosis. FINDINGS  Left Ventricle: Left ventricular ejection fraction, by estimation, is 70 to 75%. Left ventricular ejection fraction by PLAX is 74 %. The left ventricle has hyperdynamic function. The left ventricle has no regional wall motion abnormalities. The left ventricular internal cavity size was normal in size. There is mild left ventricular hypertrophy. Left ventricular diastolic parameters are consistent with Grade I diastolic dysfunction (impaired relaxation). Indeterminate filling pressures. Right Ventricle: The right ventricular size is normal. No increase in right ventricular wall thickness. Right ventricular systolic function is normal. There is mildly elevated pulmonary artery systolic pressure. The tricuspid regurgitant velocity is 2.76  m/s, and with an assumed right atrial pressure of 8 mmHg, the estimated right ventricular systolic pressure is 38.5 mmHg. Left Atrium: Left atrial size was normal in size. Right Atrium: Right atrial size was normal in size. Pericardium: There is no evidence of pericardial effusion. Mitral Valve: The mitral valve is abnormal. There is mild calcification of the posterior mitral valve leaflet(s). Trivial mitral valve regurgitation. Tricuspid Valve: The tricuspid valve is grossly normal. Tricuspid valve regurgitation is trivial. Aortic Valve: The aortic valve is tricuspid. Aortic valve regurgitation is not visualized. Mild aortic stenosis is present. Aortic valve mean gradient measures 12.0 mmHg. Aortic valve peak gradient measures 22.8 mmHg. Aortic valve area, by VTI measures 1.77 cm. Pulmonic Valve: The pulmonic valve was grossly normal. Pulmonic valve regurgitation is trivial. Aorta: The  aortic root and ascending aorta are structurally normal, with no evidence of dilitation. Venous: The inferior vena cava is dilated in size with greater than 50% respiratory variability, suggesting right atrial pressure of 8 mmHg. IAS/Shunts: No atrial level shunt detected by color flow Doppler.  LEFT VENTRICLE PLAX 2D LV EF:         Left            Diastology                ventricular     LV e' medial:    6.20 cm/s                ejection        LV E/e' medial:  19.2                fraction by     LV e' lateral:   6.96 cm/s                PLAX is 74      LV E/e' lateral: 17.1                %. LVIDd:         4.20 cm LVIDs:         2.40 cm LV PW:         1.20 cm LV IVS:  1.20 cm LVOT diam:     2.00 cm LV SV:         94 LV SV Index:   52 LVOT Area:     3.14 cm  RIGHT VENTRICLE             IVC RV Basal diam:  2.30 cm     IVC diam: 2.40 cm RV S prime:     13.20 cm/s TAPSE (M-mode): 3.0 cm LEFT ATRIUM             Index        RIGHT ATRIUM          Index LA diam:        3.50 cm 1.94 cm/m   RA Area:     9.92 cm LA Vol (A2C):   48.2 ml 26.77 ml/m  RA Volume:   19.50 ml 10.83 ml/m LA Vol (A4C):   36.0 ml 19.99 ml/m LA Biplane Vol: 41.8 ml 23.21 ml/m  AORTIC VALVE AV Area (Vmax):    1.75 cm AV Area (Vmean):   1.68 cm AV Area (VTI):     1.77 cm AV Vmax:           239.00 cm/s AV Vmean:          163.000 cm/s AV VTI:            0.529 m AV Peak Grad:      22.8 mmHg AV Mean Grad:      12.0 mmHg LVOT Vmax:         133.00 cm/s LVOT Vmean:        87.250 cm/s LVOT VTI:          0.298 m LVOT/AV VTI ratio: 0.56  AORTA Ao Root diam: 2.80 cm Ao Asc diam:  2.70 cm MITRAL VALVE                TRICUSPID VALVE MV Area (PHT): 3.60 cm     TR Peak grad:   30.5 mmHg MV Decel Time: 211 msec     TR Vmax:        276.00 cm/s MR Peak grad: 122.8 mmHg MR Mean grad: 74.0 mmHg     SHUNTS MR Vmax:      554.00 cm/s   Systemic VTI:  0.30 m MR Vmean:     400.0 cm/s    Systemic Diam: 2.00 cm MV E velocity: 119.00 cm/s MV A velocity: 114.00  cm/s MV E/A ratio:  1.04 Zoila Shutter MD Electronically signed by Zoila Shutter MD Signature Date/Time: 12/14/2023/11:56:50 AM    Final    CT ANGIO HEAD NECK W WO CM (CODE STROKE) Result Date: 12/13/2023 CLINICAL DATA:  Initial evaluation for acute stroke. EXAM: CT ANGIOGRAPHY HEAD AND NECK WITH AND WITHOUT CONTRAST TECHNIQUE: Multidetector CT imaging of the head and neck was performed using the standard protocol during bolus administration of intravenous contrast. Multiplanar CT image reconstructions and MIPs were obtained to evaluate the vascular anatomy. Carotid stenosis measurements (when applicable) are obtained utilizing NASCET criteria, using the distal internal carotid diameter as the denominator. RADIATION DOSE REDUCTION: This exam was performed according to the departmental dose-optimization program which includes automated exposure control, adjustment of the mA and/or kV according to patient size and/or use of iterative reconstruction technique. CONTRAST:  75mL OMNIPAQUE IOHEXOL 350 MG/ML SOLN COMPARISON:  CT from earlier the same day. FINDINGS: CTA NECK FINDINGS Aortic arch: Visualized aortic arch within normal limits for caliber with standard branch pattern. Aortic  atherosclerosis. No significant stenosis about the origin the great vessels. Right carotid system: Right common and internal carotid arteries are patent without dissection. Mild atheromatous change about the right carotid bulb without hemodynamically significant stenosis. Left carotid system: Left common and internal carotid arteries are patent without dissection. No mild atheromatous change about the left carotid bulb without hemodynamically significant stenosis. Small focus of intimal irregularity at the proximal cervical left ICA suspicious for thrombus, suspected to reflect changes related to a recently ruptured plaque (series 6, image 180). Vertebral arteries: Both vertebral arteries arise from subclavian arteries. Left vertebral  artery dominant. Vertebral arteries are patent without stenosis or dissection. Skeleton: No worrisome osseous lesions. Advanced spondylosis at C6-7. Other neck: No other acute finding. Upper chest: No other acute finding. Review of the MIP images confirms the above findings CTA HEAD FINDINGS Anterior circulation: Atheromatous change about the carotid siphons without hemodynamically significant stenosis. A1 segments patent bilaterally. Normal anterior communicating artery complex. Anterior cerebral arteries patent without significant stenosis. Right M1 segment and distal right MCA branches are patent well perfused. There is acute occlusion of the distal left M1 segment at the level of the left MCA bifurcation (series 6, image 295). Additional downstream proximal left M2 occlusion, inferior division (series 6, image 302). Posterior circulation: Both V4 segments patent without significant stenosis. Both PICA patent. Focal moderate stenosis involving the proximal basilar artery noted (series 8, image 96). Basilar otherwise patent distally. Superior cerebral arteries patent bilaterally. Right PCA supplied via the basilar. Fetal type origin of the left PCA. PCAs are mildly irregular but patent to their distal aspects without significant stenosis. Venous sinuses: Patent allowing for timing the contrast bolus. Anatomic variants: As above.  No aneurysm. Review of the MIP images confirms the above findings IMPRESSION: 1. Positive CTA for emergent large vessel occlusion, with occlusion of the distal left M1 segment at the level of the left MCA bifurcation. Additional downstream proximal left M2 occlusion, inferior division. 2. Small focus of intimal irregularity at the proximal cervical left ICA, suspicious for thrombus, suspected to reflect changes related to a recently ruptured plaque. This is presumably the embolic source. 3. Focal moderate stenosis involving the proximal basilar artery. 4. Additional mild atheromatous  change about the carotid bifurcations and carotid siphons without hemodynamically significant stenosis. 5.  Aortic Atherosclerosis (ICD10-I70.0). Critical Value/emergent results were discussed by telephone at the time of interpretation on 12/13/2023 at 8:41 pm to provider [facility] , who verbally acknowledged these results. Electronically Signed   By: Rise Mu M.D.   On: 12/13/2023 21:32   CT HEAD CODE STROKE WO CONTRAST Result Date: 12/13/2023 CLINICAL DATA:  Code stroke. EXAM: CT HEAD WITHOUT CONTRAST TECHNIQUE: Contiguous axial images were obtained from the base of the skull through the vertex without intravenous contrast. RADIATION DOSE REDUCTION: This exam was performed according to the departmental dose-optimization program which includes automated exposure control, adjustment of the mA and/or kV according to patient size and/or use of iterative reconstruction technique. COMPARISON:  Prior study from 02/16/2023 FINDINGS: Brain: Right parietal approach shunt catheter in place with tip in the left lateral ventricle. Ventricular size and morphology is relatively stable from prior without progressive hydrocephalus. Underlying atrophy with chronic microvascular ischemic disease. No acute intracranial hemorrhage. No acute large vessel territory infarct. No mass lesion or midline shift. No extra-axial fluid collection. Vascular: No abnormal hyperdense vessel. Scattered vascular calcifications noted within the carotid siphons. Skull: Scalp soft tissues demonstrate no acute finding. Calvarium intact. Sinuses/Orbits: Globes orbital soft  tissues within normal limits. Paranasal sinuses and mastoid air cells are clear. Other: None. ASPECTS [facility] Stroke Program Early CT Score) - Ganglionic level infarction (caudate, lentiform nuclei, internal capsule, insula, M1-M3 cortex): 7 - Supraganglionic infarction (M4-M6 cortex): 3 Total score (0-10 with 10 being normal): 10 IMPRESSION: 1. No acute intracranial  abnormality. 2. ASPECTS is 10. 3. Right parietal approach VP shunt catheter in place. Stable ventricular size and morphology without progressive hydrocephalus. 4. Underlying atrophy with chronic small vessel ischemic disease. These results were communicated to Dr. Wilford Corner at 8:25 pm on 12/13/2023 by text page via the [facility] messaging system. Electronically Signed   By: Rise Mu M.D.   On: 12/13/2023 20:27      HISTORY OF PRESENT ILLNESS 88 y.o. female with history of diabetes, chronic pain, fibromyalgia, prior history of TIA without residual deficits, headache, hypertension, peripheral arterial disease, hyperlipidemia, thyroid disease, chronic pain sleep apnea, history of a VP shunt, recent admission at [facility] for STEMI with cardiac catheterization/PCI-presented to the emergency department for evaluation of right-sided weakness and aphasia as a code stroke.  NIH 20    HOSPITAL COURSE Stroke:  left MCA scattered infarcts and right MCA punctate infarct with left M1/M2 occlusion s/p IV TNK and IR with TICI 3, etiology: Likely cardioembolic source with recent STEMI vs. Occult afib Code Stroke CT head No acute abnormality. Small vessel disease. Atrophy. ASPECTS 10.    CTA head & neck occlusion of the distal left M1 segment at the level of the left MCA bifurcation. Additional downstream proximal left M2 occlusion Status post IR with left M1/M2 occlusion and TICI3 Post IR CT no hemorrhage MRI left MCA scattered infarct with right MCA punctate infarct MRA left MCA now patent 2D Echo EF 70 to 75%.  Mild LVH with grade 1 diastolic dysfunction LE venous Doppler negative 30-day heart monitor will be sent to patient LDL 112 HgbA1c 6.6 VTE prophylaxis -Lovenox No antithrombotic (aspirin and Brilinta post discharge on 3/20 however patient was not taking) prior to admission, continue ASA and brilinta given stent Therapy recommendations:  CIR Disposition: Pending  CAD STEMI  status post cardiac stent recently discharged from [facility] after getting a cardiac catheterization for STEMI Discharged home 3/20 and on DAPT.  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 rate 14, height 5\' 2"  (1.575 m), weight 78.8 kg, SpO2 100%. PHYSICAL EXAM General: Critically ill elderly female Psych:  Mood and affect appropriate for situation CV: Regular rate and rhythm on monitor Respiratory:  Regular, unlabored respirations on cannula GI: Abdomen soft and nontender     NEURO:  Mental Status: She is awake alert and oriented self, expressive aphasia with mild dysarthria, she was able to state her name, still having word finding difficulties   Cranial Nerves:  II: PERRL. Visual fields full.  III, IV, VI: EOMI. Eyelids elevate symmetrically.  V: Sensation is intact  to light touch and symmetrical to face.  VII: Right facial VIII: hearing intact to voice. IX, X: Palate elevates symmetrically. Phonation is normal.  [id number] shrug 5/5. XII: tongue is midline without fasciculations. Motor: right arm 4/5, right leg with mild drift 4/5, left arm and leg 5/5, Tone: is normal and bulk is normal Sensation- Intact to light touch bilaterally. Extinction absent to light touch to DSS.   Coordination: FTN intact bilaterally, HKS: no ataxia in BLE.No drift.  Gait-  deferred   Most Recent NIH 4  Discharge Diet       Diet   Diet Heart Room service appropriate? Yes with Assist; Fluid consistency: Thin   liquids  DISCHARGE PLAN Disposition:  home with home health  aspirin 81 mg daily and Brilinta (ticagrelor) 90 mg bid for secondary stroke prevention Ongoing stroke risk factor control by Primary Care Physician at time of discharge Follow-up PCP Jethro Bastos, MD in 2 weeks. Follow-up in [facility] in 8 weeks, please call office to schedule an appointment.   35 minutes were spent preparing discharge.   Pt seen by Neuro NP/APP and later by MD. Note/plan to be edited by MD as needed.    Lynnae January, DNP, AGACNP-BC [facility] Please use AMION for contact information & EPIC for messaging.  I have personally obtained history,examined this patient, reviewed notes, independently viewed imaging studies, participated in medical decision making and plan of care.ROS completed by me personally and pertinent positives fully documented  I have made any additions or clarifications directly to the above note. Agree with note above.    Delia Heady, MD Medical Director [facility] Pager: [phone] 12/18/2023 4:55 PM

## 2023-12-18 NOTE — [place] FL2 (Signed)
 [place] MEDICAID FL2 LEVEL OF CARE FORM     IDENTIFICATION  Patient Name: Brittany Atkins Birthdate: Oct 05, 1935 Sex: female Admission Date (Current Location): 12/13/2023  [facility] [facility] [employer] and [place] Number:  Producer, television/film/video and Address:  [facility], [address] [place], [place] [address]      Provider Number: [id number]  Attending Physician Name and Address:  Stroke, Md, MD  Relative Name and Phone Number:       Current Level of Care: Hospital Recommended Level of Care: Skilled Nursing Facility Prior Approval Number:    Date Approved/Denied:   PASRR Number: [id number]  Discharge Plan: SNF    Current Diagnoses: Patient Active Problem List   Diagnosis Date Noted   Acute ischemic left MCA stroke (HCC) 12/13/2023   Middle cerebral artery embolism, left 12/13/2023   Acute focal neurological deficit 07/05/2020   Acute appendicitis 02/06/2018   Memory loss 04/21/2016   Lumbar radiculopathy 02/29/2016   Diastolic dysfunction 02/21/2016   History of peptic ulcer disease 02/14/2016   Chronic GERD 12/08/2015   Irritable bowel syndrome with constipation 12/08/2015   Type 2 diabetes mellitus with obesity (HCC) 12/08/2015   Benign essential HTN 08/18/2015   Fibromyalgia 08/18/2015   Obstructive sleep apnea 08/18/2015   Communicating hydrocephalus (HCC) 08/18/2014   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 08/18/2014    Orientation RESPIRATION BLADDER Height & Weight     Self, Time, Situation, Place  Normal Continent Weight: 173 lb 11.6 oz (78.8 kg) Height:  5\' 2"  (157.5 cm)  BEHAVIORAL SYMPTOMS/MOOD NEUROLOGICAL BOWEL NUTRITION STATUS      Continent Diet (heart healthy)  AMBULATORY STATUS COMMUNICATION OF NEEDS Skin   Limited Assist Verbally Normal                       Personal Care Assistance Level of Assistance  Bathing, Feeding, Dressing Bathing Assistance: Limited assistance Feeding assistance:  Independent Dressing Assistance: Limited assistance     Functional Limitations Info  Speech     Speech Info: Impaired (dysarthria)    SPECIAL CARE FACTORS FREQUENCY  PT (By licensed PT), OT (By licensed OT)     PT Frequency: 5x/wk OT Frequency: 5x/wk            Contractures Contractures Info: Not present    Additional Factors Info  Code Status, Allergies Code Status Info: DNR Allergies Info: Cefdinir, Clindamycin/lincomycin, Codeine, Cymbalta (Duloxetine Hcl), Sulfamethizole, Tizanidine, Lactose, Oxycodone           Current Medications (12/18/2023):  This is the current hospital active medication list Current Facility-Administered Medications  Medication Dose Route Frequency Provider Last Rate Last Admin   acetaminophen (TYLENOL) tablet 650 mg  650 mg Oral Q4H PRN Deveshwar, Simonne Maffucci, MD       Or   acetaminophen (TYLENOL) 160 MG/5ML solution 650 mg  650 mg Per Tube Q4H PRN Deveshwar, Sanjeev, MD       Or   acetaminophen (TYLENOL) suppository 650 mg  650 mg Rectal Q4H PRN Deveshwar, Simonne Maffucci, MD       aspirin EC tablet 81 mg  81 mg Oral Daily Marvel Plan, MD   81 mg at 12/18/23 5621   atorvastatin (LIPITOR) tablet 40 mg  40 mg Oral Daily Gevena Mart A, NP   40 mg at 12/18/23 3086   Chlorhexidine Gluconate Cloth 2 % PADS 6 each  6 each Topical Daily Milon Dikes, MD   6 each at 12/17/23 [id number]  enoxaparin (LOVENOX) injection 40 mg  40 mg Subcutaneous Q24H Marvel Plan, MD   40 mg at 12/17/23 2106   nitrofurantoin (macrocrystal-monohydrate) (MACROBID) capsule 100 mg  100 mg Oral Q12H Gevena Mart A, NP   100 mg at 12/18/23 0825   Oral care mouth rinse  15 mL Mouth Rinse 4 times per day Milon Dikes, MD   15 mL at 12/18/23 1610   Oral care mouth rinse  15 mL Mouth Rinse PRN Milon Dikes, MD       pantoprazole (PROTONIX) EC tablet 40 mg  40 mg Oral QHS Marvel Plan, MD   40 mg at 12/17/23 2105   senna-docusate (Senokot-S) tablet 1 tablet  1 tablet Oral QHS PRN Milon Dikes, MD   1 tablet at 12/15/23 2212   ticagrelor (BRILINTA) tablet 90 mg  90 mg Oral BID Marvel Plan, MD   90 mg at 12/18/23 9604     Discharge Medications: Please see discharge summary for a list of discharge medications.  Relevant Imaging Results:  Relevant Lab Results:   Additional Information SS#: [id number]  Baldemar Lenis, LCSW

## 2023-12-18 NOTE — Progress Notes (Signed)
 STROKE TEAM PROGRESS NOTE    SIGNIFICANT HOSPITAL EVENTS 3/20 received IV TNK for acute onset of right-sided weakness and aphasia CTA with left M1 occlusion at the bifurcation and a concomitant left M2 branch occlusion.  Went to IR  INTERIM HISTORY/SUBJECTIVE No family at the bedside.  No new neurological events overnight.  He is sitting up in bedside chair looking quite comfortable. Neurological exam is improving, speech is improved still has some expressive aphasia but better than yesterday.     She is medically stable for transfer to rehab   OBJECTIVE  CBC    Component Value Date/Time   WBC 7.5 12/18/2023 0518   RBC 3.29 (L) 12/18/2023 0518   HGB 9.8 (L) 12/18/2023 0518   HCT 29.4 (L) 12/18/2023 0518   PLT 184 12/18/2023 0518   MCV 89.4 12/18/2023 0518   MCH 29.8 12/18/2023 0518   MCHC 33.3 12/18/2023 0518   RDW 13.3 12/18/2023 0518   LYMPHSABS 0.7 12/14/2023 0301   MONOABS 0.6 12/14/2023 0301   EOSABS 0.0 12/14/2023 0301   BASOSABS 0.0 12/14/2023 0301    BMET    Component Value Date/Time   NA 136 12/18/2023 0518   K 3.6 12/18/2023 0518   CL 105 12/18/2023 0518   CO2 20 (L) 12/18/2023 0518   GLUCOSE 109 (H) 12/18/2023 0518   BUN 16 12/18/2023 0518   CREATININE 0.81 12/18/2023 0518   CALCIUM 8.9 12/18/2023 0518   GFRNONAA >60 12/18/2023 0518    IMAGING past 24 hours No results found.   Vitals:   12/18/23 0324 12/18/23 0800 12/18/23 0809 12/18/23 1239  BP: (!) 160/76 (!) 147/70 137/70 (!) 150/60  Pulse: 67 (!) 58 63 63  Resp: 18 20  17   Temp: 98.2 F (36.8 C) 97.8 F (36.6 C) 99 F (37.2 C) 97.8 F (36.6 C)  TempSrc: Oral Oral Oral Oral  SpO2: 97% 92% 97% 99%  Weight:      Height:         PHYSICAL EXAM General: Critically ill elderly female Psych:  Mood and affect appropriate for situation CV: Regular rate and rhythm on monitor Respiratory:  Regular, unlabored respirations on cannula GI: Abdomen soft and nontender   NEURO:  Mental  Status: She is awake alert and oriented self, expressive aphasia with mild dysarthria, she was able to state her name, still having word finding difficulties  Cranial Nerves:  II: PERRL. Visual fields full.  III, IV, VI: EOMI. Eyelids elevate symmetrically.  V: Sensation is intact to light touch and symmetrical to face.  VII: Right facial VIII: hearing intact to voice. IX, X: Palate elevates symmetrically. Phonation is normal.  WU:[id number] shrug 5/5. XII: tongue is midline without fasciculations. Motor: right arm 4/5, right leg with mild drift 4/5, left arm and leg 5/5, Tone: is normal and bulk is normal Sensation- Intact to light touch bilaterally. Extinction absent to light touch to DSS.   Coordination: FTN intact bilaterally, HKS: no ataxia in BLE.No drift.  Gait- deferred  Most Recent NIH 4   ASSESSMENT/PLAN  [name] is a 88 y.o. female with history of diabetes, chronic pain, fibromyalgia, prior history of TIA without residual deficits, headache, hypertension, peripheral arterial disease, hyperlipidemia, thyroid disease, chronic pain sleep apnea, history of a VP shunt, recent admission at [facility] hospital for STEMI with cardiac catheterization/PCI-presented to the emergency department for evaluation of right-sided weakness and aphasia as a code stroke.  NIH 20  Stroke:  left MCA scattered infarcts  and right MCA punctate infarct with left M1/M2 occlusion s/p IV TNK and IR with TICI 3, etiology: Likely cardioembolic source with recent STEMI vs. Occult afib Code Stroke CT head No acute abnormality. Small vessel disease. Atrophy. ASPECTS 10.    CTA head & neck occlusion of the distal left M1 segment at the level of the left MCA bifurcation. Additional downstream proximal left M2 occlusion Status post IR with left M1/M2 occlusion and TICI3 Post IR CT no hemorrhage MRI left MCA scattered infarct with right MCA punctate infarct MRA left MCA now patent 2D  Echo EF 70 to 75%.  Mild LVH with grade 1 diastolic dysfunction LE venous Doppler negative Consider loop recorder prior to discharge LDL 112 HgbA1c 6.6 VTE prophylaxis -Lovenox No antithrombotic (aspirin and Brilinta post discharge on 3/20 however patient was not taking) prior to admission, now on ASA and brilinta given  stent Therapy recommendations:  CIR Disposition: Pending  CAD STEMI status post cardiac stent recently discharged from [facility] after getting a cardiac catheterization for STEMI Discharged home 3/20 and on DAPT.  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  Patient is medically stable to be transferred to rehab in a skilled nursing facility as inpatient rehab no longer feels she is a candidate.     No family at the bedside.  Brittany Heady, MD Medical Director [facility] Pager: [phone] 12/18/2023 2:49 PM    To contact Stroke Continuity provider, please refer to [email or web address]. After hours, contact General Neurology

## 2023-12-18 NOTE — TOC Progression Note (Signed)
 Transition of Care [facility]) - Progression Note    Patient Details  Name: Brittany Atkins MRN: [id number] Date of Birth: Nov 03, 1935  [facility] of [facility]) CM/SW Contact  Kermit Balo, RN Phone Number: 12/18/2023, 11:00 AM  Clinical Narrative:     [facility] with [facility]. She states she doesn't go to the center but she has caregivers that come to the home. Her meds come prepackaged from [facility].  DME at home: walker/ rollator/ cane/ [facility] provides needed transportation for appointments. Her daughter provides transportation for other needs.  CM has left voicemail x 2 for Jerrel Ivory at [facility] to discuss d/c planning. TOC following.  Expected Discharge Plan: Home w Home Health Services Barriers to Discharge: Continued Medical Work up  Expected Discharge Plan and Services   Discharge Planning Services: CM Consult   Living arrangements for the past 2 months: Single Family Home                                       Social Determinants of Health (SDOH) Interventions SDOH Screenings   Food Insecurity: No Food Insecurity (12/14/2023)  Housing: Low Risk  (12/14/2023)  Transportation Needs: No Transportation Needs (12/14/2023)  Utilities: Not At Risk (12/14/2023)  Social Connections: Patient Unable To Answer (12/14/2023)  Tobacco Use: Low Risk  (12/11/2023)   Received from [facility]    Readmission Risk Interventions     No data to display

## 2023-12-19 LAB — URINE CULTURE: Culture: 40000 — AB

## 2023-12-27 NOTE — Progress Notes (Signed)
 Kindly inform the patient that urine culture suggests some bacteria but this may be colonization and not necessarily infection.  Kindly discuss this with primary care physician.

## 2024-01-24 ENCOUNTER — Ambulatory Visit: Payer: Medicare (Managed Care) | Attending: Cardiology

## 2024-01-24 DIAGNOSIS — I63512 Cerebral infarction due to unspecified occlusion or stenosis of left middle cerebral artery: Secondary | ICD-10-CM

## 2024-02-08 ENCOUNTER — Other Ambulatory Visit: Payer: Self-pay

## 2024-02-08 DIAGNOSIS — N63 Unspecified lump in unspecified breast: Secondary | ICD-10-CM

## 2024-02-12 IMAGING — CT CT HEAD W/O CM
3 series · 15 of 47 positions shown, 18 images · non-contrast
Comparison: Head CT 07/05/2020.

CLINICAL DATA: 85-year-old female with history of intracranial
shunt placement presenting with abdominal pain and left-sided flank
pain.



[Series 2: head wo · axial · 0.44mm/px · z∈[-266,-136]mm · 9 of 32 slices shown, 12 images]
[im 3/32  brain]
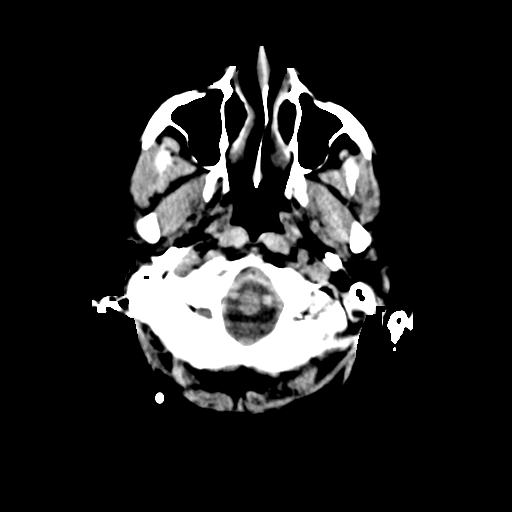
[im 3/32  bone]
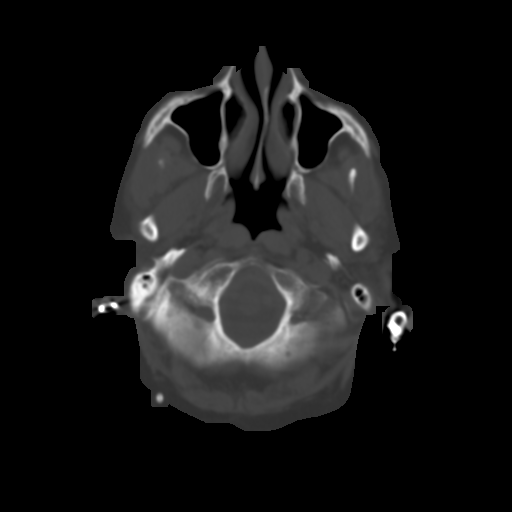
[im 6/32  brain]
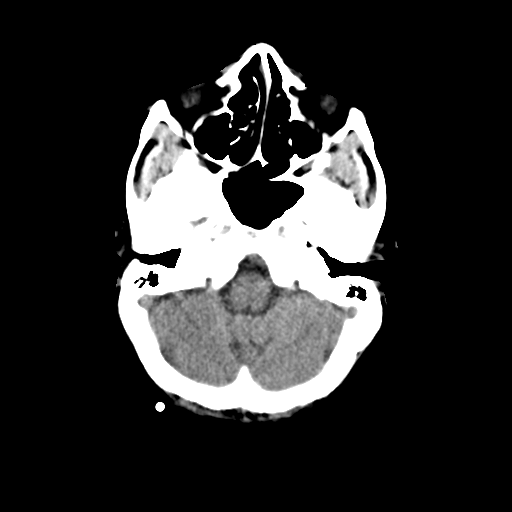
[im 9/32  brain]
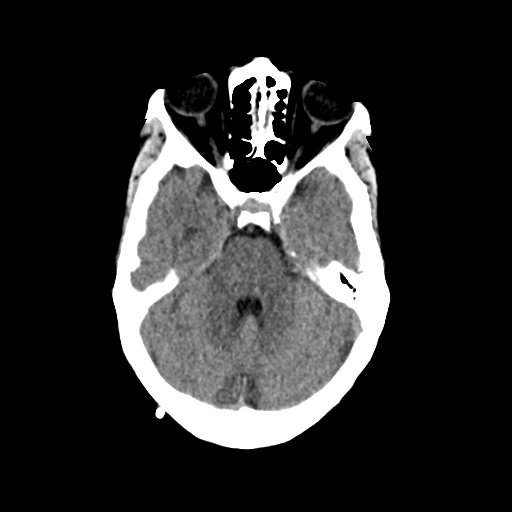
[im 12/32  brain]
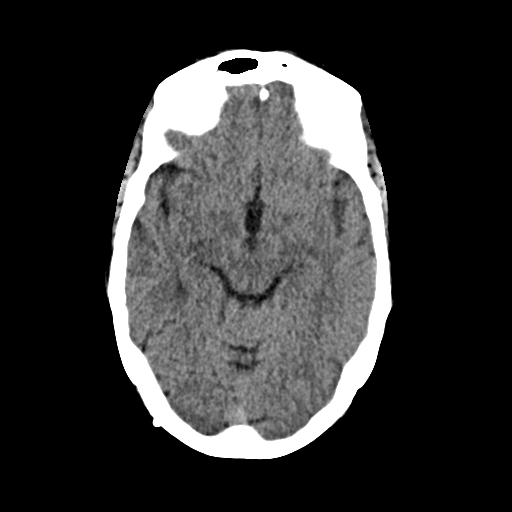
[im 17/32  brain]
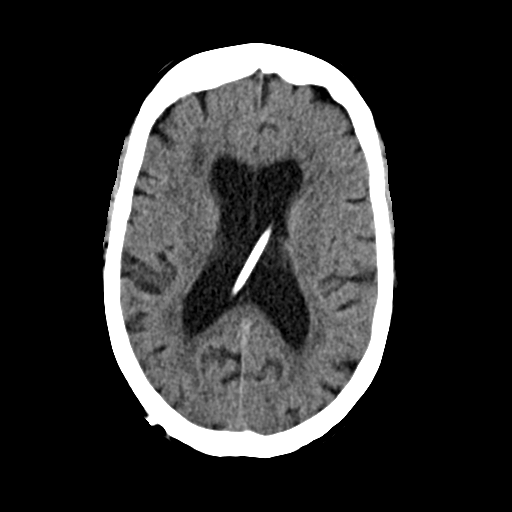
[im 17/32  bone]
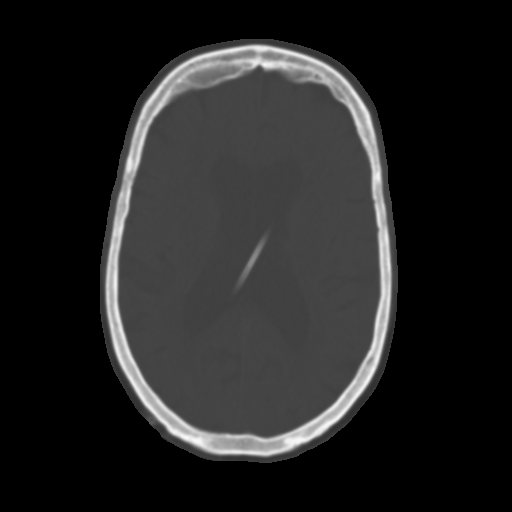
[im 20/32  brain]
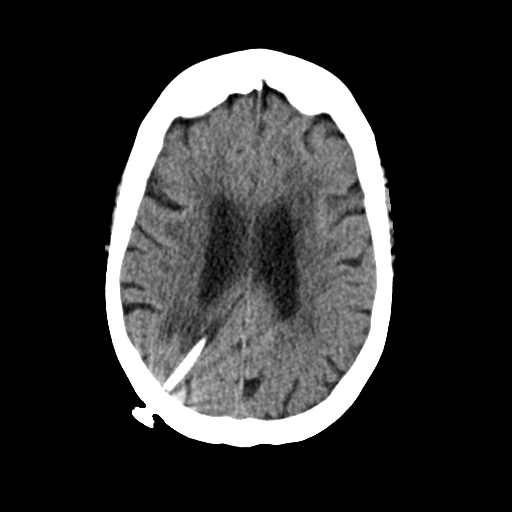
[im 23/32  brain]
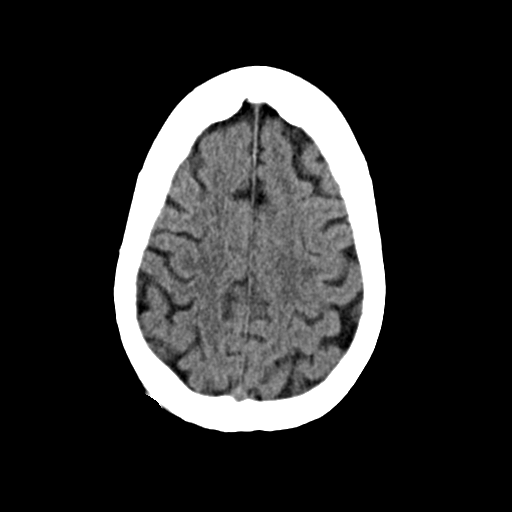
[im 26/32  brain]
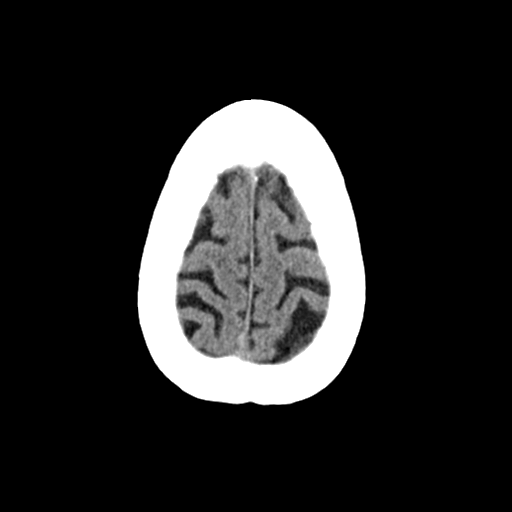
[im 29/32  brain]
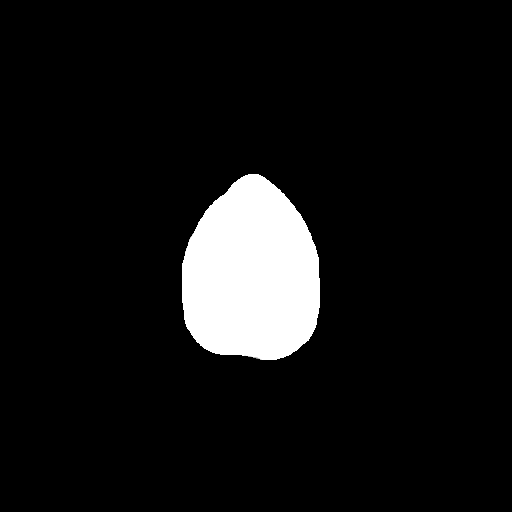
[im 29/32  bone]
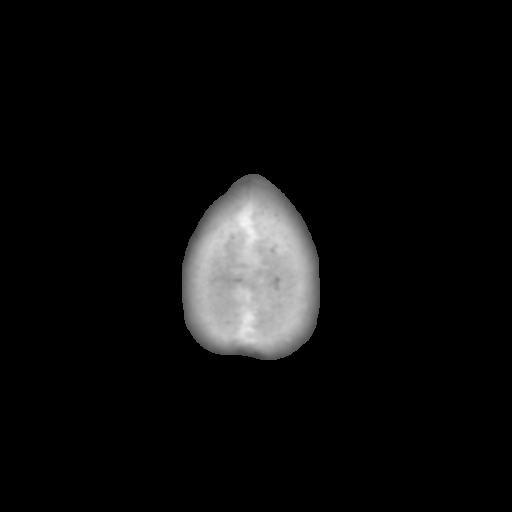

[Series 4: coronal soft · coronal · 0.30mm/px · 3 of 67 slices shown]
[im 23/67  brain]
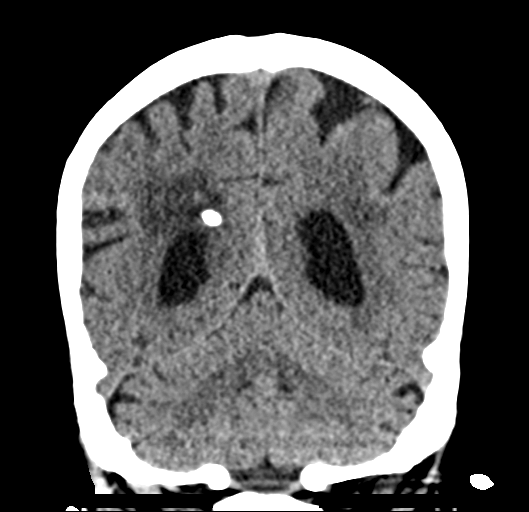
[im 30/67  brain]
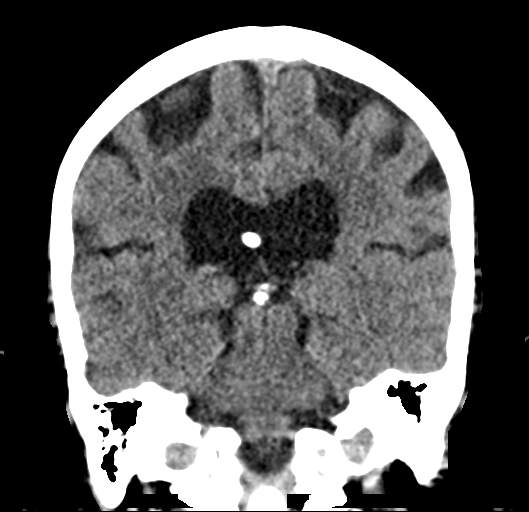
[im 37/67  brain]
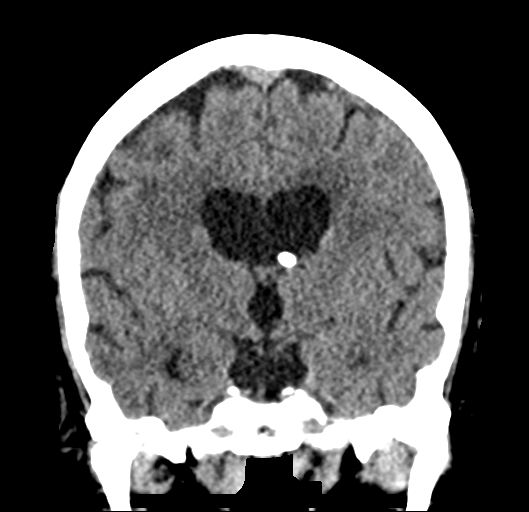

[Series 5: sag soft · sagittal · 0.30mm/px · 3 of 51 slices shown]
[im 17/51  brain]
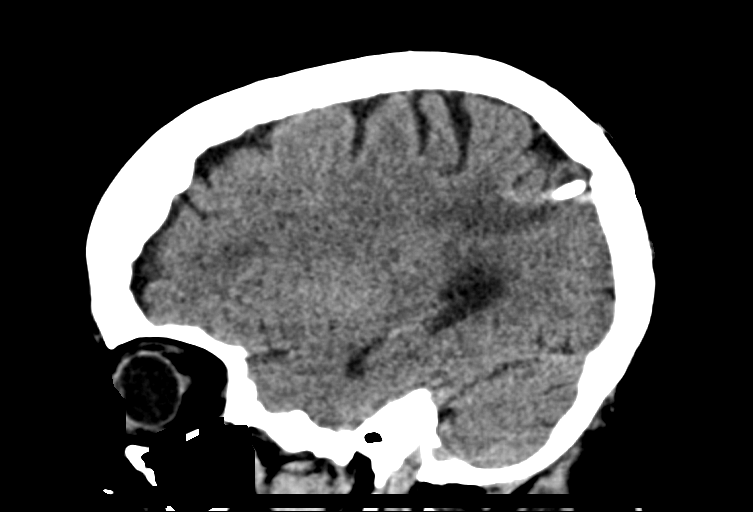
[im 26/51  brain]
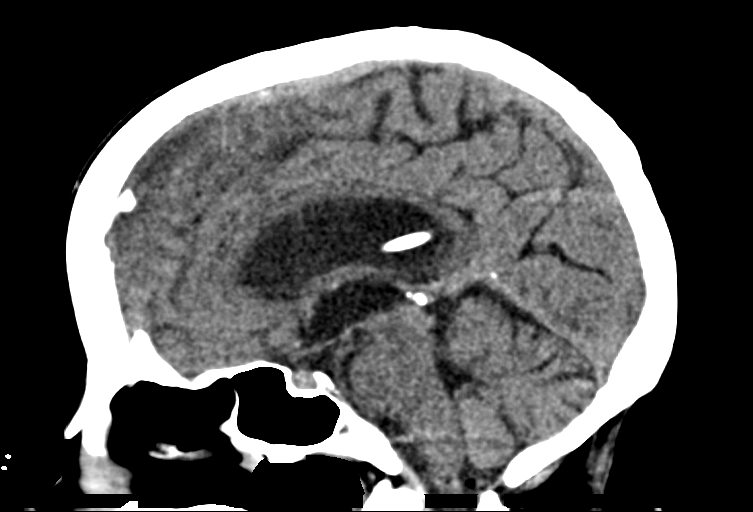
[im 34/51  brain]
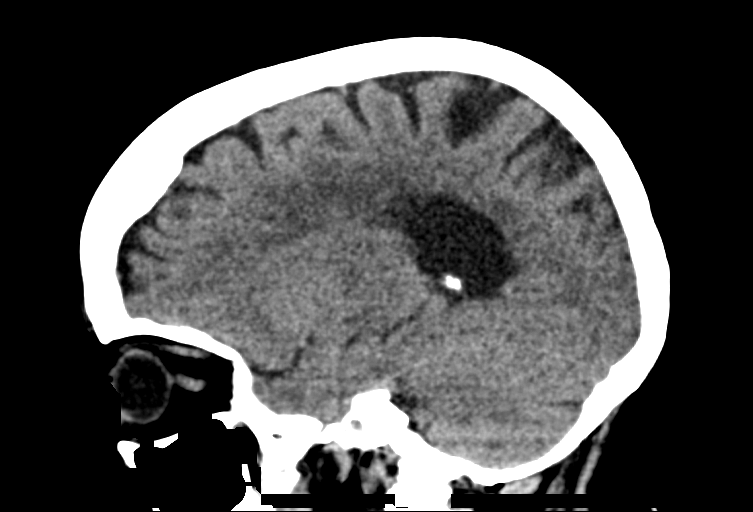

[15 of 47 positions shown; findings below may reference images not displayed]

FINDINGS: Brain: Right parieto-occipital ventriculostomy shunt catheter with
tip extending across the midline into the anterior aspect of the
left lateral ventricle, similar to the prior study. No
hydrocephalus, ventricular size is stable compared to the prior
study. Mild cerebral atrophy. Patchy and confluent areas of
decreased attenuation are noted throughout the deep and
periventricular white matter of the cerebral hemispheres
bilaterally, compatible with chronic microvascular ischemic disease.
No evidence of acute infarction, hemorrhage, extra-axial collection
or mass lesion/mass effect.

Vascular: No hyperdense vessel or unexpected calcification.

Skull: Normal. Negative for fracture or focal lesion.

Sinuses/Orbits: No acute finding.

Other: None.
IMPRESSION: 1. No acute intracranial abnormalities.
2. Right ventriculostomy shunt catheter appears stable in position.
No hydrocephalus.
3. Mild cerebral atrophy with chronic microvascular ischemic changes
in the cerebral white matter, similar to the prior study, as above.

## 2024-02-26 ENCOUNTER — Telehealth: Payer: Self-pay | Admitting: Neurology

## 2024-02-26 NOTE — Telephone Encounter (Signed)
 [facility] called to schedule appointment. Informed physician has been 5 years since being seen and would need a referral. She said trying to find a physician patient has seen for a procedure at GI.

## 2024-02-29 ENCOUNTER — Other Ambulatory Visit: Payer: Self-pay | Admitting: Internal Medicine

## 2024-02-29 DIAGNOSIS — N63 Unspecified lump in unspecified breast: Secondary | ICD-10-CM

## 2024-03-03 ENCOUNTER — Ambulatory Visit
Admission: RE | Admit: 2024-03-03 | Discharge: 2024-03-03 | Disposition: A | Discharge status: Home / Self Care | Payer: Medicare (Managed Care) | Source: Ambulatory Visit

## 2024-03-03 DIAGNOSIS — N63 Unspecified lump in unspecified breast: Secondary | ICD-10-CM

## 2024-03-04 ENCOUNTER — Ambulatory Visit (INDEPENDENT_AMBULATORY_CARE_PROVIDER_SITE_OTHER): Payer: Medicare (Managed Care) | Admitting: Podiatry

## 2024-03-04 ENCOUNTER — Encounter: Payer: Self-pay | Admitting: Podiatry

## 2024-03-04 DIAGNOSIS — E1151 Type 2 diabetes mellitus with diabetic peripheral angiopathy without gangrene: Secondary | ICD-10-CM | POA: Diagnosis not present

## 2024-03-04 DIAGNOSIS — M79672 Pain in left foot: Secondary | ICD-10-CM

## 2024-03-04 DIAGNOSIS — M79671 Pain in right foot: Secondary | ICD-10-CM | POA: Diagnosis not present

## 2024-03-04 DIAGNOSIS — I70209 Unspecified atherosclerosis of native arteries of extremities, unspecified extremity: Secondary | ICD-10-CM

## 2024-03-04 DIAGNOSIS — B351 Tinea unguium: Secondary | ICD-10-CM

## 2024-03-04 NOTE — Progress Notes (Signed)
 Patient presents for evaluation and treatment of tenderness and some redness around nails feet.  Tenderness around toes with walking and wearing shoes.  Physical exam:  General appearance: Alert, pleasant, and in no acute distress.  Vascular: Pedal pulses: DP 2/4 B/L, PT 2/4 B/L.  Minimal edema lower legs bilaterally  Neurological:  No paresthesias or burning noted  Dermatologic:  Nails thickened, disfigured, discolored 1-5 BL with subungual debris.  Redness and hypertrophic nail folds along nail folds bilaterally but no signs of drainage or infection.  Skin thin and atrophic.  Musculoskeletal:     Diagnosis: 1. Painful onychomycotic nails 1 through 5 bilaterally. 2. Pain toes 1 through 5 bilaterally. 3.  Diabetes mellitus type 2 with PVD  Plan: Debrided onychomycotic nails 1 through 5 bilaterally.  Return 3 months

## 2024-03-10 ENCOUNTER — Ambulatory Visit: Payer: Self-pay | Admitting: Cardiology

## 2024-03-10 DIAGNOSIS — I63512 Cerebral infarction due to unspecified occlusion or stenosis of left middle cerebral artery: Secondary | ICD-10-CM

## 2024-03-18 ENCOUNTER — Encounter: Payer: Self-pay | Admitting: Cardiology

## 2024-03-26 ENCOUNTER — Telehealth: Payer: Self-pay

## 2024-03-26 NOTE — Patient Outreach (Signed)
 First telephone outreach attempt to obtain mRS. No answer. Unable to leave message for returned call.  [facility]  Population Health Care Management Assistant  Direct Dial: [phone]  Fax: [phone] Website: [email or web address]

## 2024-03-27 ENCOUNTER — Telehealth: Payer: Self-pay

## 2024-03-27 NOTE — Patient Outreach (Signed)
 Second telephone outreach attempt to obtain mRS. No answer. Unable to leave message for returned call.  [facility]  Population Health Care Management Assistant  Direct Dial: [phone]  Fax: [phone] Website: [email or web address]

## 2024-03-31 ENCOUNTER — Ambulatory Visit: Payer: Medicare (Managed Care) | Admitting: Cardiology

## 2024-03-31 ENCOUNTER — Telehealth: Payer: Self-pay

## 2024-03-31 NOTE — Patient Outreach (Signed)
 3 outreach attempts were completed to obtain mRs. mRs could not be obtained because patient never returned my calls. mRs=7    Brittany Atkins [facility] Care Management Assistant  Direct Dial: [phone]  Fax: [phone] Website: [email or web address]

## 2024-04-29 ENCOUNTER — Other Ambulatory Visit: Payer: Self-pay

## 2024-04-29 DIAGNOSIS — K1120 Sialoadenitis, unspecified: Secondary | ICD-10-CM

## 2024-04-29 DIAGNOSIS — K1379 Other lesions of oral mucosa: Secondary | ICD-10-CM

## 2024-04-30 ENCOUNTER — Other Ambulatory Visit: Payer: Medicare (Managed Care)

## 2024-05-09 ENCOUNTER — Ambulatory Visit: Payer: Medicare (Managed Care) | Admitting: Cardiology

## 2024-05-16 ENCOUNTER — Other Ambulatory Visit: Payer: Medicare (Managed Care)

## 2024-05-19 ENCOUNTER — Other Ambulatory Visit: Payer: Medicare (Managed Care)

## 2024-05-21 ENCOUNTER — Other Ambulatory Visit: Payer: Medicare (Managed Care)

## 2024-06-04 ENCOUNTER — Ambulatory Visit: Payer: Medicare (Managed Care) | Admitting: Podiatry

## 2024-06-06 ENCOUNTER — Other Ambulatory Visit: Payer: Medicare (Managed Care)

## 2024-06-06 ENCOUNTER — Ambulatory Visit
Admission: RE | Admit: 2024-06-06 | Discharge: 2024-06-06 | Disposition: A | Discharge status: Home / Self Care | Payer: Medicare (Managed Care) | Source: Ambulatory Visit

## 2024-06-06 DIAGNOSIS — K1120 Sialoadenitis, unspecified: Secondary | ICD-10-CM

## 2024-06-06 DIAGNOSIS — K1379 Other lesions of oral mucosa: Secondary | ICD-10-CM

## 2024-06-27 ENCOUNTER — Other Ambulatory Visit: Payer: Self-pay

## 2024-06-27 DIAGNOSIS — Z1231 Encounter for screening mammogram for malignant neoplasm of breast: Secondary | ICD-10-CM

## 2024-08-28 ENCOUNTER — Ambulatory Visit: Payer: Medicare (Managed Care)

## 2024-09-01 ENCOUNTER — Ambulatory Visit: Payer: Medicare (Managed Care)
